# Patient Record
Sex: Female | Born: 1935 | Race: Black or African American | Hispanic: No | Marital: Married | Smoking: Never smoker
Health system: Southern US, Community
[De-identification: ages and names within clinical notes are randomized; demographics above are authoritative.]

## PROBLEM LIST (undated history)

## (undated) DIAGNOSIS — I5020 Unspecified systolic (congestive) heart failure: Secondary | ICD-10-CM

## (undated) DIAGNOSIS — E119 Type 2 diabetes mellitus without complications: Secondary | ICD-10-CM

## (undated) DIAGNOSIS — M1990 Unspecified osteoarthritis, unspecified site: Secondary | ICD-10-CM

## (undated) DIAGNOSIS — E785 Hyperlipidemia, unspecified: Secondary | ICD-10-CM

## (undated) DIAGNOSIS — S065XAA Traumatic subdural hemorrhage with loss of consciousness status unknown, initial encounter: Secondary | ICD-10-CM

## (undated) DIAGNOSIS — G459 Transient cerebral ischemic attack, unspecified: Secondary | ICD-10-CM

## (undated) DIAGNOSIS — G4730 Sleep apnea, unspecified: Secondary | ICD-10-CM

## (undated) DIAGNOSIS — I10 Essential (primary) hypertension: Secondary | ICD-10-CM

---

## 1958-11-01 HISTORY — PX: HEMORRHOID SURGERY: SHX153

## 1976-03-02 HISTORY — PX: ABDOMINAL HYSTERECTOMY: SHX81

## 1997-07-11 ENCOUNTER — Emergency Department (HOSPITAL_COMMUNITY): Admission: EM | Admit: 1997-07-11 | Discharge: 1997-07-11 | Payer: Self-pay | Admitting: Emergency Medicine

## 2002-11-22 ENCOUNTER — Encounter: Payer: Self-pay | Admitting: Cardiology

## 2002-11-22 ENCOUNTER — Ambulatory Visit (HOSPITAL_COMMUNITY): Admission: RE | Admit: 2002-11-22 | Discharge: 2002-11-22 | Payer: Self-pay | Admitting: Cardiology

## 2003-07-26 ENCOUNTER — Encounter: Admission: RE | Admit: 2003-07-26 | Discharge: 2003-07-26 | Payer: Self-pay | Admitting: Family Medicine

## 2003-09-27 ENCOUNTER — Ambulatory Visit (HOSPITAL_COMMUNITY): Admission: RE | Admit: 2003-09-27 | Discharge: 2003-09-27 | Payer: Self-pay | Admitting: Gastroenterology

## 2005-01-15 ENCOUNTER — Encounter: Admission: RE | Admit: 2005-01-15 | Discharge: 2005-01-15 | Payer: Self-pay | Admitting: Cardiology

## 2005-06-24 ENCOUNTER — Emergency Department (HOSPITAL_COMMUNITY): Admission: EM | Admit: 2005-06-24 | Discharge: 2005-06-24 | Payer: Self-pay | Admitting: Emergency Medicine

## 2005-10-16 ENCOUNTER — Encounter (HOSPITAL_COMMUNITY): Admission: RE | Admit: 2005-10-16 | Discharge: 2005-11-11 | Payer: Self-pay | Admitting: Cardiology

## 2010-03-23 ENCOUNTER — Encounter: Payer: Self-pay | Admitting: Cardiology

## 2012-08-10 NOTE — Progress Notes (Signed)
Need orders please - pt coming for preop Fri 08-12-12 - thank you

## 2012-08-11 ENCOUNTER — Encounter (HOSPITAL_COMMUNITY): Payer: Self-pay | Admitting: Pharmacy Technician

## 2012-08-12 ENCOUNTER — Ambulatory Visit (HOSPITAL_COMMUNITY)
Admission: RE | Admit: 2012-08-12 | Discharge: 2012-08-12 | Disposition: A | Discharge status: Home / Self Care | Payer: Medicare Other | Source: Ambulatory Visit | Attending: Orthopedic Surgery | Admitting: Orthopedic Surgery

## 2012-08-12 ENCOUNTER — Encounter (HOSPITAL_COMMUNITY)
Admission: RE | Admit: 2012-08-12 | Discharge: 2012-08-12 | Disposition: A | Discharge status: Home / Self Care | Payer: Medicare Other | Source: Ambulatory Visit | Attending: Orthopedic Surgery | Admitting: Orthopedic Surgery

## 2012-08-12 ENCOUNTER — Encounter (HOSPITAL_COMMUNITY): Payer: Self-pay

## 2012-08-12 DIAGNOSIS — M47814 Spondylosis without myelopathy or radiculopathy, thoracic region: Secondary | ICD-10-CM | POA: Insufficient documentation

## 2012-08-12 DIAGNOSIS — Z01818 Encounter for other preprocedural examination: Secondary | ICD-10-CM | POA: Insufficient documentation

## 2012-08-12 HISTORY — DX: Essential (primary) hypertension: I10

## 2012-08-12 HISTORY — DX: Hyperlipidemia, unspecified: E78.5

## 2012-08-12 HISTORY — DX: Sleep apnea, unspecified: G47.30

## 2012-08-12 HISTORY — DX: Hypercalcemia: E83.52

## 2012-08-12 HISTORY — DX: Unspecified osteoarthritis, unspecified site: M19.90

## 2012-08-12 LAB — CBC
HCT: 44.5 % (ref 36.0–46.0)
Hemoglobin: 14.3 g/dL (ref 12.0–15.0)
MCHC: 32.1 g/dL (ref 30.0–36.0)

## 2012-08-12 LAB — URINALYSIS, ROUTINE W REFLEX MICROSCOPIC
Bilirubin Urine: NEGATIVE
Leukocytes, UA: NEGATIVE
Nitrite: NEGATIVE
Specific Gravity, Urine: 1.014 (ref 1.005–1.030)
pH: 7.5 (ref 5.0–8.0)

## 2012-08-12 LAB — BASIC METABOLIC PANEL
BUN: 14 mg/dL (ref 6–23)
Chloride: 100 mEq/L (ref 96–112)
GFR calc Af Amer: 67 mL/min — ABNORMAL LOW (ref >90)
Glucose, Bld: 163 mg/dL — ABNORMAL HIGH (ref 70–99)
Potassium: 4 mEq/L (ref 3.5–5.1)

## 2012-08-12 LAB — SURGICAL PCR SCREEN
MRSA, PCR: NEGATIVE
Staphylococcus aureus: NEGATIVE

## 2012-08-12 LAB — PROTIME-INR: INR: 1 (ref 0.00–1.49)

## 2012-08-12 NOTE — Pre-Procedure Instructions (Signed)
PT HAS EKG REPORT ON HER CHART 07/27/12 AND OFFICE NOTE 07/27/12 AND NOTE OF MEDICAL CLEARANCE FROM DR. G. STONE. CXR WAS DONE TODAY PREOP AT [facility].

## 2012-08-12 NOTE — Patient Instructions (Signed)
YOUR SURGERY IS SCHEDULED AT [facility]  ON:   Tuesday  June 24  REPORT TO [facility] AT:  7:25 AM      PHONE # FOR SHORT STAY IS [phone]  DO NOT EAT OR DRINK ANYTHING AFTER MIDNIGHT THE NIGHT BEFORE YOUR SURGERY.  YOU MAY BRUSH YOUR TEETH, RINSE OUT YOUR MOUTH--BUT NO WATER, NO FOOD, NO CHEWING GUM, NO MINTS, NO CANDIES, NO CHEWING TOBACCO.  PLEASE TAKE THE FOLLOWING MEDICATIONS THE AM OF YOUR SURGERY WITH A FEW SIPS OF WATER:  TAKE YOUR TRAMADOL FOR PAIN IF YOU NEED IT.   IF YOU HAVE SLEEP APNEA AND USE CPAP OR BIPAP--PLEASE BRING THE MASK AND THE TUBING.  DO NOT BRING YOUR MACHINE.  DO NOT BRING VALUABLES, MONEY, CREDIT CARDS.  DO NOT WEAR JEWELRY, MAKE-UP, NAIL POLISH AND NO METAL PINS OR CLIPS IN YOUR HAIR. CONTACT LENS, DENTURES / PARTIALS, GLASSES SHOULD NOT BE WORN TO SURGERY AND IN MOST CASES-HEARING AIDS WILL NEED TO BE REMOVED.  BRING YOUR GLASSES CASE, ANY EQUIPMENT NEEDED FOR YOUR CONTACT LENS. FOR PATIENTS ADMITTED TO THE HOSPITAL--CHECK OUT TIME THE DAY OF DISCHARGE IS 11:00 AM.  ALL INPATIENT ROOMS ARE PRIVATE - WITH BATHROOM, TELEPHONE, TELEVISION AND WIFI INTERNET.                              PLEASE READ OVER ANY  FACT SHEETS THAT YOU WERE GIVEN: MRSA INFORMATION, BLOOD TRANSFUSION INFORMATION, INCENTIVE SPIROMETER INFORMATION. FAILURE TO FOLLOW THESE INSTRUCTIONS MAY RESULT IN THE CANCELLATION OF YOUR SURGERY.   PATIENT SIGNATURE_________________________________

## 2012-08-18 NOTE — H&P (Signed)
TOTAL HIP ADMISSION H&P  Patient is admitted for right total hip arthroplasty, anterior approach.  Subjective:  Chief Complaint:    Right hip OA / pain  HPI: Amber Bolton, 79 y.o. female, has a history of pain and functional disability in the right hip(s) due to arthritis and patient has failed non-surgical conservative treatments for greater than 12 weeks to include NSAID's and/or analgesics, use of assistive devices and activity modification.  Onset of symptoms was gradual starting 2 years ago with rapidlly worsening course since that time.The patient noted no past surgery on the right hip(s).  Patient currently rates pain in the right hip at 8 out of 10 with activity. Patient has night pain, worsening of pain with activity and weight bearing, trendelenberg gait, pain that interfers with activities of daily living and pain with passive range of motion. Patient has evidence of periarticular osteophytes and joint space narrowing by imaging studies. This condition presents safety issues increasing the risk of falls.  There is no current active signs of infection.  Risks, benefits and expectations were discussed with the patient. Patient understand the risks, benefits and expectations and wishes to proceed with surgery.   D/C Plans:   Home with HHPT  Post-op Meds:    Rx given for ASA, Zanaflex, Iron, Colace and MiraLax  Tranexamic Acid:   To be given  Decadron:    To be given  FYI:    ASA post-op    Past Medical History  Diagnosis Date  . Hypertension   . Hypercalcemia   . Hyperlipemia   . Sleep apnea     STATES SHE HAS CPAP - BUT HAS NOT USED IN THE LAST 2 YRS  . Arthritis     IN RIGHT HIP AND FINGERS AND BACK    Past Surgical History  Procedure Laterality Date  . Abdominal hysterectomy  1978  . Hemorrhoid surgery  1960'S     Allergies  Allergen Reactions  . Shellfish Allergy     MAKES MOUTH ITCH  . Penicillins Rash    History  Substance Use Topics  . Smoking status:  Never Smoker   . Smokeless tobacco: Never Used  . Alcohol Use: No    No family history on file.   Review of Systems  Constitutional: Negative.   HENT: Positive for hearing loss.   Eyes: Negative.   Respiratory: Negative.   Cardiovascular: Negative.   Gastrointestinal: Negative.   Genitourinary: Negative.   Musculoskeletal: Positive for joint pain.  Skin: Negative.   Neurological: Negative.   Endo/Heme/Allergies: Negative.   Psychiatric/Behavioral: Negative.     Objective:  Physical Exam  Constitutional: She is oriented to person, place, and time. She appears well-developed and well-nourished.  HENT:  Head: Normocephalic and atraumatic.  Mouth/Throat: Oropharynx is clear and moist.  Eyes: Pupils are equal, round, and reactive to light.  Neck: Neck supple. No JVD present. No tracheal deviation present. No thyromegaly present.  Cardiovascular: Normal rate, regular rhythm, normal heart sounds and intact distal pulses.   Respiratory: Effort normal and breath sounds normal. No stridor. No respiratory distress. She has no wheezes.  GI: Soft. There is no tenderness. There is no guarding.  Musculoskeletal:       Right hip: She exhibits decreased range of motion, decreased strength, tenderness and bony tenderness. She exhibits no swelling, no deformity and no laceration.  Lymphadenopathy:    She has no cervical adenopathy.  Neurological: She is alert and oriented to person, place, and time.  Skin:  Skin is warm and dry.  Psychiatric: She has a normal mood and affect.    Imaging Review Plain radiographs demonstrate severe degenerative joint disease of the right hip(s). The bone quality appears to be good for age and reported activity level.  Assessment/Plan:  End stage arthritis, right hip(s)  The patient history, physical examination, clinical judgement of the provider and imaging studies are consistent with end stage degenerative joint disease of the right hip(s) and total hip  arthroplasty is deemed medically necessary. The treatment options including medical management, injection therapy, arthroscopy and arthroplasty were discussed at length. The risks and benefits of total hip arthroplasty were presented and reviewed. The risks due to aseptic loosening, infection, stiffness, dislocation/subluxation,  thromboembolic complications and other imponderables were discussed.  The patient acknowledged the explanation, agreed to proceed with the plan and consent was signed. Patient is being admitted for inpatient treatment for surgery, pain control, PT, OT, prophylactic antibiotics, VTE prophylaxis, progressive ambulation and ADL's and discharge planning.The patient is planning to be discharged home with home health services.    Anastasio Auerbach Mykah Shin   PAC  08/18/2012, 2:53 PM

## 2012-08-23 ENCOUNTER — Inpatient Hospital Stay (HOSPITAL_COMMUNITY): Payer: Medicare Other

## 2012-08-23 ENCOUNTER — Encounter (HOSPITAL_COMMUNITY): Payer: Self-pay | Admitting: Anesthesiology

## 2012-08-23 ENCOUNTER — Encounter (HOSPITAL_COMMUNITY)
Admission: RE | Disposition: A | Discharge status: Home Health | Payer: Self-pay | Source: Ambulatory Visit | Attending: Orthopedic Surgery

## 2012-08-23 ENCOUNTER — Inpatient Hospital Stay (HOSPITAL_COMMUNITY)
Admission: RE | Admit: 2012-08-23 | Discharge: 2012-08-24 | DRG: 470 | Disposition: A | Discharge status: Home Health | Payer: Medicare Other | Source: Ambulatory Visit | Attending: Orthopedic Surgery | Admitting: Orthopedic Surgery

## 2012-08-23 ENCOUNTER — Inpatient Hospital Stay (HOSPITAL_COMMUNITY): Payer: Medicare Other | Admitting: Anesthesiology

## 2012-08-23 ENCOUNTER — Encounter (HOSPITAL_COMMUNITY): Payer: Self-pay | Admitting: *Deleted

## 2012-08-23 DIAGNOSIS — E669 Obesity, unspecified: Secondary | ICD-10-CM | POA: Diagnosis present

## 2012-08-23 DIAGNOSIS — M169 Osteoarthritis of hip, unspecified: Principal | ICD-10-CM | POA: Diagnosis present

## 2012-08-23 DIAGNOSIS — I10 Essential (primary) hypertension: Secondary | ICD-10-CM | POA: Diagnosis present

## 2012-08-23 DIAGNOSIS — Z6830 Body mass index (BMI) 30.0-30.9, adult: Secondary | ICD-10-CM

## 2012-08-23 DIAGNOSIS — D500 Iron deficiency anemia secondary to blood loss (chronic): Secondary | ICD-10-CM | POA: Diagnosis not present

## 2012-08-23 DIAGNOSIS — E785 Hyperlipidemia, unspecified: Secondary | ICD-10-CM | POA: Diagnosis present

## 2012-08-23 DIAGNOSIS — Z96649 Presence of unspecified artificial hip joint: Secondary | ICD-10-CM

## 2012-08-23 DIAGNOSIS — M1610 Unilateral primary osteoarthritis, unspecified hip: Principal | ICD-10-CM | POA: Diagnosis present

## 2012-08-23 DIAGNOSIS — D62 Acute posthemorrhagic anemia: Secondary | ICD-10-CM | POA: Diagnosis not present

## 2012-08-23 HISTORY — PX: TOTAL HIP ARTHROPLASTY: SHX124

## 2012-08-23 LAB — TYPE AND SCREEN
ABO/RH(D): A POS
Antibody Screen: NEGATIVE

## 2012-08-23 SURGERY — ARTHROPLASTY, HIP, TOTAL, ANTERIOR APPROACH
Anesthesia: Spinal | Site: Hip | Laterality: Right | Wound class: Clean

## 2012-08-23 MED ORDER — CELECOXIB 200 MG PO CAPS
200.0000 mg | ORAL_CAPSULE | Freq: Two times a day (BID) | ORAL | Status: DC
  Administered 2012-08-23 – 2012-08-24 (×2): 200 mg via ORAL
  Filled 2012-08-23 (×3): qty 1

## 2012-08-23 MED ORDER — PROPOFOL INFUSION 10 MG/ML OPTIME
INTRAVENOUS | Status: DC | PRN
  Administered 2012-08-23: 140 ug/kg/min via INTRAVENOUS

## 2012-08-23 MED ORDER — ALPRAZOLAM 0.5 MG PO TABS: 0.5000 mg | ORAL_TABLET | Freq: Every evening | ORAL | Status: DC | PRN

## 2012-08-23 MED ORDER — MENTHOL 3 MG MT LOZG
1.0000 | LOZENGE | OROMUCOSAL | Status: DC | PRN
  Filled 2012-08-23: qty 9

## 2012-08-23 MED ORDER — FLEET ENEMA 7-19 GM/118ML RE ENEM: 1.0000 | ENEMA | Freq: Once | RECTAL | Status: AC | PRN

## 2012-08-23 MED ORDER — METHOCARBAMOL 500 MG PO TABS
500.0000 mg | ORAL_TABLET | Freq: Four times a day (QID) | ORAL | Status: DC | PRN
  Administered 2012-08-23 – 2012-08-24 (×3): 500 mg via ORAL
  Filled 2012-08-23 (×3): qty 1

## 2012-08-23 MED ORDER — METOCLOPRAMIDE HCL 10 MG PO TABS: 5.0000 mg | ORAL_TABLET | Freq: Three times a day (TID) | ORAL | Status: DC | PRN

## 2012-08-23 MED ORDER — ZOLPIDEM TARTRATE 5 MG PO TABS: 5.0000 mg | ORAL_TABLET | Freq: Every evening | ORAL | Status: DC | PRN

## 2012-08-23 MED ORDER — HYDROMORPHONE HCL PF 1 MG/ML IJ SOLN: 0.2500 mg | INTRAMUSCULAR | Status: DC | PRN

## 2012-08-23 MED ORDER — ALUM & MAG HYDROXIDE-SIMETH 200-200-20 MG/5ML PO SUSP: 30.0000 mL | ORAL | Status: DC | PRN

## 2012-08-23 MED ORDER — DOCUSATE SODIUM 100 MG PO CAPS
100.0000 mg | ORAL_CAPSULE | Freq: Two times a day (BID) | ORAL | Status: DC
  Administered 2012-08-23 – 2012-08-24 (×2): 100 mg via ORAL

## 2012-08-23 MED ORDER — PROMETHAZINE HCL 25 MG/ML IJ SOLN: 6.2500 mg | INTRAMUSCULAR | Status: DC | PRN

## 2012-08-23 MED ORDER — FENTANYL CITRATE 0.05 MG/ML IJ SOLN
INTRAMUSCULAR | Status: DC | PRN
  Administered 2012-08-23: 50 ug via INTRAVENOUS
  Administered 2012-08-23 (×2): 25 ug via INTRAVENOUS

## 2012-08-23 MED ORDER — TRANEXAMIC ACID 100 MG/ML IV SOLN
1000.0000 mg | Freq: Once | INTRAVENOUS | Status: AC
  Administered 2012-08-23: 1000 mg via INTRAVENOUS
  Filled 2012-08-23: qty 10

## 2012-08-23 MED ORDER — ONDANSETRON HCL 4 MG PO TABS
4.0000 mg | ORAL_TABLET | Freq: Four times a day (QID) | ORAL | Status: DC | PRN
  Administered 2012-08-23: 4 mg via ORAL
  Filled 2012-08-23: qty 1

## 2012-08-23 MED ORDER — LACTATED RINGERS IV SOLN
INTRAVENOUS | Status: DC | PRN
  Administered 2012-08-23 (×2): via INTRAVENOUS

## 2012-08-23 MED ORDER — ACETAMINOPHEN 10 MG/ML IV SOLN: 1000.0000 mg | Freq: Once | INTRAVENOUS | Status: DC | PRN

## 2012-08-23 MED ORDER — DIPHENHYDRAMINE HCL 25 MG PO CAPS: 25.0000 mg | ORAL_CAPSULE | Freq: Four times a day (QID) | ORAL | Status: DC | PRN

## 2012-08-23 MED ORDER — FERROUS SULFATE 325 (65 FE) MG PO TABS
325.0000 mg | ORAL_TABLET | Freq: Three times a day (TID) | ORAL | Status: DC
  Administered 2012-08-24 (×2): 325 mg via ORAL
  Filled 2012-08-23 (×4): qty 1

## 2012-08-23 MED ORDER — AMLODIPINE BESYLATE 5 MG PO TABS
5.0000 mg | ORAL_TABLET | Freq: Every morning | ORAL | Status: DC
  Administered 2012-08-24: 5 mg via ORAL
  Filled 2012-08-23: qty 1

## 2012-08-23 MED ORDER — DEXAMETHASONE SODIUM PHOSPHATE 10 MG/ML IJ SOLN
10.0000 mg | Freq: Once | INTRAMUSCULAR | Status: AC
  Administered 2012-08-23: 10 mg via INTRAVENOUS

## 2012-08-23 MED ORDER — METHOCARBAMOL 100 MG/ML IJ SOLN
500.0000 mg | Freq: Four times a day (QID) | INTRAVENOUS | Status: DC | PRN
  Filled 2012-08-23: qty 5

## 2012-08-23 MED ORDER — CHLORHEXIDINE GLUCONATE 4 % EX LIQD: 60.0000 mL | Freq: Once | CUTANEOUS | Status: DC

## 2012-08-23 MED ORDER — PHENYLEPHRINE HCL 10 MG/ML IJ SOLN
INTRAMUSCULAR | Status: DC | PRN
  Administered 2012-08-23: 40 ug via INTRAVENOUS
  Administered 2012-08-23 (×3): 80 ug via INTRAVENOUS

## 2012-08-23 MED ORDER — CELECOXIB 200 MG PO CAPS
2200.0000 mg | ORAL_CAPSULE | Freq: Two times a day (BID) | ORAL | Status: DC
  Filled 2012-08-23 (×2): qty 11

## 2012-08-23 MED ORDER — STERILE WATER FOR IRRIGATION IR SOLN
Status: DC | PRN
  Administered 2012-08-23: 1500 mL

## 2012-08-23 MED ORDER — ACETAMINOPHEN 10 MG/ML IV SOLN
INTRAVENOUS | Status: DC | PRN
  Administered 2012-08-23: 1000 mg via INTRAVENOUS

## 2012-08-23 MED ORDER — PHENOL 1.4 % MT LIQD
1.0000 | OROMUCOSAL | Status: DC | PRN
  Filled 2012-08-23: qty 177

## 2012-08-23 MED ORDER — DEXAMETHASONE SODIUM PHOSPHATE 10 MG/ML IJ SOLN
10.0000 mg | Freq: Once | INTRAMUSCULAR | Status: AC
  Administered 2012-08-24: 10 mg via INTRAVENOUS
  Filled 2012-08-23: qty 1

## 2012-08-23 MED ORDER — ASPIRIN EC 325 MG PO TBEC
325.0000 mg | DELAYED_RELEASE_TABLET | Freq: Two times a day (BID) | ORAL | Status: DC
  Administered 2012-08-24: 325 mg via ORAL
  Filled 2012-08-23 (×3): qty 1

## 2012-08-23 MED ORDER — PROPOFOL 10 MG/ML IV BOLUS
INTRAVENOUS | Status: DC | PRN
  Administered 2012-08-23: 30 mg via INTRAVENOUS

## 2012-08-23 MED ORDER — OXYCODONE HCL 5 MG/5ML PO SOLN
5.0000 mg | Freq: Once | ORAL | Status: DC | PRN
  Filled 2012-08-23: qty 5

## 2012-08-23 MED ORDER — HYDROCODONE-ACETAMINOPHEN 7.5-325 MG PO TABS
1.0000 | ORAL_TABLET | ORAL | Status: DC
  Administered 2012-08-23 – 2012-08-24 (×5): 1 via ORAL
  Administered 2012-08-24: 2 via ORAL
  Filled 2012-08-23: qty 2
  Filled 2012-08-23 (×3): qty 1
  Filled 2012-08-23 (×3): qty 2

## 2012-08-23 MED ORDER — 0.9 % SODIUM CHLORIDE (POUR BTL) OPTIME
TOPICAL | Status: DC | PRN
  Administered 2012-08-23: 1000 mL

## 2012-08-23 MED ORDER — BISACODYL 10 MG RE SUPP: 10.0000 mg | Freq: Every day | RECTAL | Status: DC | PRN

## 2012-08-23 MED ORDER — ONDANSETRON HCL 4 MG/2ML IJ SOLN: 4.0000 mg | Freq: Four times a day (QID) | INTRAMUSCULAR | Status: DC | PRN

## 2012-08-23 MED ORDER — CEFAZOLIN SODIUM-DEXTROSE 2-3 GM-% IV SOLR
2.0000 g | INTRAVENOUS | Status: AC
  Administered 2012-08-23: 2 g via INTRAVENOUS

## 2012-08-23 MED ORDER — BUPIVACAINE IN DEXTROSE 0.75-8.25 % IT SOLN
INTRATHECAL | Status: DC | PRN
  Administered 2012-08-23: 2 mL via INTRATHECAL

## 2012-08-23 MED ORDER — AMLODIPINE BESYLATE 5 MG PO TABS
5.0000 mg | ORAL_TABLET | Freq: Every day | ORAL | Status: DC
  Filled 2012-08-23: qty 1

## 2012-08-23 MED ORDER — SODIUM CHLORIDE 0.9 % IV SOLN
100.0000 mL/h | INTRAVENOUS | Status: DC
  Administered 2012-08-23 – 2012-08-24 (×2): 100 mL/h via INTRAVENOUS
  Filled 2012-08-23 (×8): qty 1000

## 2012-08-23 MED ORDER — OXYCODONE HCL 5 MG PO TABS: 5.0000 mg | ORAL_TABLET | Freq: Once | ORAL | Status: DC | PRN

## 2012-08-23 MED ORDER — METOCLOPRAMIDE HCL 5 MG/ML IJ SOLN: 5.0000 mg | Freq: Three times a day (TID) | INTRAMUSCULAR | Status: DC | PRN

## 2012-08-23 MED ORDER — AMLODIPINE BESYLATE-VALSARTAN 5-320 MG PO TABS: 1.0000 | ORAL_TABLET | Freq: Every morning | ORAL | Status: DC

## 2012-08-23 MED ORDER — CEFAZOLIN SODIUM-DEXTROSE 2-3 GM-% IV SOLR
2.0000 g | Freq: Four times a day (QID) | INTRAVENOUS | Status: AC
  Administered 2012-08-23 (×2): 2 g via INTRAVENOUS
  Filled 2012-08-23 (×2): qty 50

## 2012-08-23 MED ORDER — IRBESARTAN 300 MG PO TABS
300.0000 mg | ORAL_TABLET | Freq: Every morning | ORAL | Status: DC
  Administered 2012-08-24: 300 mg via ORAL
  Filled 2012-08-23: qty 1

## 2012-08-23 MED ORDER — MEPERIDINE HCL 50 MG/ML IJ SOLN: 6.2500 mg | INTRAMUSCULAR | Status: DC | PRN

## 2012-08-23 MED ORDER — HYDROMORPHONE HCL PF 1 MG/ML IJ SOLN
0.5000 mg | INTRAMUSCULAR | Status: DC | PRN
  Administered 2012-08-23: 0.5 mg via INTRAVENOUS
  Filled 2012-08-23: qty 1

## 2012-08-23 MED ORDER — POLYETHYLENE GLYCOL 3350 17 G PO PACK
17.0000 g | PACK | Freq: Two times a day (BID) | ORAL | Status: DC
  Administered 2012-08-23 – 2012-08-24 (×2): 17 g via ORAL

## 2012-08-23 SURGICAL SUPPLY — 40 items
ADH SKN CLS APL DERMABOND .7 (GAUZE/BANDAGES/DRESSINGS) ×1
BAG SPEC THK2 15X12 ZIP CLS (MISCELLANEOUS) ×2
BAG ZIPLOCK 12X15 (MISCELLANEOUS) ×4 IMPLANT
BLADE SAW SGTL 18X1.27X75 (BLADE) ×2 IMPLANT
CAPT HIP PF MOP ×1 IMPLANT
CLOTH BEACON ORANGE TIMEOUT ST (SAFETY) ×2 IMPLANT
DERMABOND ADVANCED (GAUZE/BANDAGES/DRESSINGS) ×1
DERMABOND ADVANCED .7 DNX12 (GAUZE/BANDAGES/DRESSINGS) ×1 IMPLANT
DRAPE C-ARM 42X120 X-RAY (DRAPES) ×2 IMPLANT
DRAPE STERI IOBAN 125X83 (DRAPES) ×2 IMPLANT
DRAPE U-SHAPE 47X51 STRL (DRAPES) ×6 IMPLANT
DRSG AQUACEL AG ADV 3.5X10 (GAUZE/BANDAGES/DRESSINGS) ×2 IMPLANT
DRSG TEGADERM 4X4.75 (GAUZE/BANDAGES/DRESSINGS) ×1 IMPLANT
DURAPREP 26ML APPLICATOR (WOUND CARE) ×2 IMPLANT
ELECT BLADE TIP CTD 4 INCH (ELECTRODE) ×2 IMPLANT
ELECT REM PT RETURN 9FT ADLT (ELECTROSURGICAL) ×2
ELECTRODE REM PT RTRN 9FT ADLT (ELECTROSURGICAL) ×1 IMPLANT
EVACUATOR 1/8 PVC DRAIN (DRAIN) IMPLANT
FACESHIELD LNG OPTICON STERILE (SAFETY) ×8 IMPLANT
GAUZE SPONGE 2X2 8PLY STRL LF (GAUZE/BANDAGES/DRESSINGS) ×1 IMPLANT
GLOVE BIOGEL PI IND STRL 7.5 (GLOVE) ×1 IMPLANT
GLOVE BIOGEL PI IND STRL 8 (GLOVE) ×1 IMPLANT
GLOVE BIOGEL PI INDICATOR 7.5 (GLOVE) ×1
GLOVE BIOGEL PI INDICATOR 8 (GLOVE) ×1
GLOVE ECLIPSE 8.0 STRL XLNG CF (GLOVE) ×2 IMPLANT
GLOVE ORTHO TXT STRL SZ7.5 (GLOVE) ×4 IMPLANT
GOWN BRE IMP PREV XXLGXLNG (GOWN DISPOSABLE) ×2 IMPLANT
GOWN STRL NON-REIN LRG LVL3 (GOWN DISPOSABLE) ×2 IMPLANT
KIT BASIN OR (CUSTOM PROCEDURE TRAY) ×2 IMPLANT
PACK TOTAL JOINT (CUSTOM PROCEDURE TRAY) ×2 IMPLANT
PADDING CAST COTTON 6X4 STRL (CAST SUPPLIES) ×2 IMPLANT
SPONGE GAUZE 2X2 STER 10/PKG (GAUZE/BANDAGES/DRESSINGS) ×1
SUCTION FRAZIER 12FR DISP (SUCTIONS) ×2 IMPLANT
SUT MNCRL AB 4-0 PS2 18 (SUTURE) ×2 IMPLANT
SUT VIC AB 1 CT1 36 (SUTURE) ×8 IMPLANT
SUT VIC AB 2-0 CT1 27 (SUTURE) ×4
SUT VIC AB 2-0 CT1 TAPERPNT 27 (SUTURE) ×2 IMPLANT
SUT VLOC 180 0 24IN GS25 (SUTURE) ×2 IMPLANT
TOWEL OR 17X26 10 PK STRL BLUE (TOWEL DISPOSABLE) ×4 IMPLANT
TRAY FOLEY CATH 14FRSI W/METER (CATHETERS) ×2 IMPLANT

## 2012-08-23 NOTE — Anesthesia Preprocedure Evaluation (Addendum)
Anesthesia Evaluation  Patient identified by MRN, date of birth, ID band Patient awake    Reviewed: Allergy & Precautions, H&P , NPO status , Patient's Chart, lab work & pertinent test results  Airway Mallampati: II TM Distance: >3 FB Neck ROM: Full    Dental  (+) Dental Advisory Given, Loose, Poor Dentition and Missing   Pulmonary sleep apnea ,  breath sounds clear to auscultation        Cardiovascular hypertension, Pt. on medications Rhythm:Regular Rate:Normal     Neuro/Psych negative neurological ROS  negative psych ROS   GI/Hepatic negative GI ROS, Neg liver ROS,   Endo/Other  negative endocrine ROS  Renal/GU negative Renal ROS     Musculoskeletal negative musculoskeletal ROS (+)   Abdominal (+) + obese,   Peds  Hematology negative hematology ROS (+)   Anesthesia Other Findings   Reproductive/Obstetrics negative OB ROS                          Anesthesia Physical Anesthesia Plan  ASA: III  Anesthesia Plan: Spinal   Post-op Pain Management:    Induction: Intravenous  Airway Management Planned:   Additional Equipment:   Intra-op Plan:   Post-operative Plan:   Informed Consent: I have reviewed the patients History and Physical, chart, labs and discussed the procedure including the risks, benefits and alternatives for the proposed anesthesia with the patient or authorized representative who has indicated his/her understanding and acceptance.   Dental advisory given  Plan Discussed with: CRNA  Anesthesia Plan Comments:        Anesthesia Quick Evaluation

## 2012-08-23 NOTE — Interval H&P Note (Signed)
History and Physical Interval Note:  08/23/2012 8:51 AM  Amber Bolton  has presented today for surgery, with the diagnosis of RIGHT HIP OA  The various methods of treatment have been discussed with the patient and family. After consideration of risks, benefits and other options for treatment, the patient has consented to  Procedure(s): RIGHT TOTAL HIP ARTHROPLASTY ANTERIOR APPROACH (Right) as a surgical intervention .  The patient's history has been reviewed, patient examined, no change in status, stable for surgery.  I have reviewed the patient's chart and labs.  Questions were answered to the patient's satisfaction.     [name]

## 2012-08-23 NOTE — Op Note (Signed)
NAME:  DONDA FRIEDLI.: [id number]      MEDICAL RECORD NO.: [id number]      FACILITY:  [facility]      PHYSICIAN:  Durene Romans D  DATE OF BIRTH:  [birth year]     DATE OF PROCEDURE:  08/23/2012                                 OPERATIVE REPORT         PREOPERATIVE DIAGNOSIS: Right  hip osteoarthritis.      POSTOPERATIVE DIAGNOSIS:  Right hip osteoarthritis.      PROCEDURE:  Right total hip replacement through an anterior approach   utilizing DePuy THR system, component size 50mm pinnacle cup, a size 32+4 neutral   Altrex liner, a size 4 standard Tri Lock stem with a 32+5 metal ball.      SURGEON:  Madlyn Frankel. Charlann Boxer, M.D.      ASSISTANT:  Lanney Gins, PA-C      ANESTHESIA:  General.      SPECIMENS:  None.      COMPLICATIONS:  None.      BLOOD LOSS:  250 cc     DRAINS:  One Hemovac.      INDICATION OF THE PROCEDURE:  ARVILLA SALADA is a 79 y.o. female who had   presented to office for evaluation of right hip pain.  Radiographs revealed   progressive degenerative changes with bone-on-bone   articulation to the  hip joint.  The patient had painful limited range of   motion significantly affecting their overall quality of life.  The patient was failing to    respond to conservative measures, and at this point was ready   to proceed with more definitive measures.  The patient has noted progressive   degenerative changes in his hip, progressive problems and dysfunction   with regarding the hip prior to surgery.  Consent was obtained for   benefit of pain relief.  Specific risk of infection, DVT, component   failure, dislocation, need for revision surgery, as well discussion of   the anterior versus posterior approach were reviewed.  Consent was   obtained for benefit of anterior pain relief through an anterior   approach.      PROCEDURE IN DETAIL:  The patient was brought to operative theater.   Once adequate anesthesia,  preoperative antibiotics, 2gm Ancef administered.   The patient was positioned supine on the OSI Hanna table.  Once adequate   padding of boney process was carried out, we had predraped out the hip, and  used fluoroscopy to confirm orientation of the pelvis and position.      The right hip was then prepped and draped from proximal iliac crest to   mid thigh with shower curtain technique.      Time-out was performed identifying the patient, planned procedure, and   extremity.     An incision was then made 2 cm distal and lateral to the   anterior superior iliac spine extending over the orientation of the   tensor fascia lata muscle and sharp dissection was carried down to the   fascia of the muscle and protractor placed in the soft tissues.      The fascia was then incised.  The muscle belly was identified and swept  laterally and retractor placed along the superior neck.  Following   cauterization of the circumflex vessels and removing some pericapsular   fat, a second cobra retractor was placed on the inferior neck.  A third   retractor was placed on the anterior acetabulum after elevating the   anterior rectus.  A L-capsulotomy was along the line of the   superior neck to the trochanteric fossa, then extended proximally and   distally.  Tag sutures were placed and the retractors were then placed   intracapsular.  We then identified the trochanteric fossa and   orientation of my neck cut, confirmed this radiographically   and then made a neck osteotomy with the femur on traction.  The femoral   head was removed without difficulty or complication.  Traction was let   off and retractors were placed posterior and anterior around the   acetabulum.      The labrum and foveal tissue were debrided.  I began reaming with a 45mm   reamer and reamed up to 49mm reamer with good bony bed preparation and a 50   cup was chosen.  The final 50mm Pinnacle cup was then impacted under fluoroscopy  to  confirm the depth of penetration and orientation with respect to   abduction.  A screw was placed followed by the hole eliminator.  The final   32+4 neutral Altrex liner was impacted with good visualized rim fit.  The cup was positioned anatomically within the acetabular portion of the pelvis.      At this point, the femur was rolled at 80 degrees.  Further capsule was   released off the inferior aspect of the femoral neck.  I then   released the superior capsule proximally.  The hook was placed laterally   along the femur and elevated manually and held in position with the bed   hook.  The leg was then extended and adducted with the leg rolled to 100   degrees of external rotation.  Once the proximal femur was fully   exposed, I used a box osteotome to set orientation.  I then began   broaching with the starting chili pepper broach and passed this by hand and then broached up to 4.  With the 4 broach in place I chose a standard offset neck and did a trial reduction.  The offset was appropriate, leg lengths   appeared to be equal, confirmed radiographically.   Given these findings, I went ahead and dislocated the hip, repositioned all   retractors and positioned the right hip in the extended and abducted position.  The final 4 standard Tri Lock stem was   chosen and it was impacted down to the level of neck cut.  Based on this   and the trial reduction, a 32+5 metal ball was chosen and   impacted onto a clean and dry trunnion, and the hip was reduced.  The   hip had been irrigated throughout the case again at this point.  I did   reapproximate the superior capsular leaflet to the anterior leaflet   using #1 Vicryl, placed a medium Hemovac drain deep.  The fascia of the   tensor fascia lata muscle was then reapproximated using #1 Vicryl.  The   remaining wound was closed with 2-0 Vicryl and running 4-0 Monocryl.   The hip was cleaned, dried, and dressed sterilely using Dermabond and   Aquacel  dressing.  Drain site dressed separately.  She was then  brought   to recovery room in stable condition tolerating the procedure well.    Lanney Gins, PA-C was present for the entirety of the case involved from   preoperative positioning, perioperative retractor management, general   facilitation of the case, as well as primary wound closure as assistant.            Madlyn Frankel Charlann Boxer, M.D.            MDO/MEDQ  D:  12/23/2010  T:  12/23/2010  Job:  [id number]      Electronically Signed by Durene Romans M.D. on 12/29/2010 09:15:38 AM

## 2012-08-23 NOTE — Preoperative (Signed)
Beta Blockers   Reason not to administer Beta Blockers:Not Applicable 

## 2012-08-23 NOTE — Transfer of Care (Signed)
Immediate Anesthesia Transfer of Care Note  Patient: Amber Bolton  Procedure(s) Performed: Procedure(s): RIGHT TOTAL HIP ARTHROPLASTY ANTERIOR APPROACH (Right)  Patient Location: PACU  Anesthesia Type:Spinal  Level of Consciousness: awake, alert  and oriented  Airway & Oxygen Therapy: Patient Spontanous Breathing and Patient connected to face mask oxygen  Post-op Assessment: Report given to PACU RN and Post -op Vital signs reviewed and stable  Post vital signs: Reviewed and stable  Complications: No apparent anesthesia complications

## 2012-08-23 NOTE — Anesthesia Procedure Notes (Signed)
Spinal  Patient location during procedure: OR Start time: 08/23/2012 9:58 AM End time: 08/23/2012 10:03 AM Staffing Anesthesiologist: Lewie Loron R Performed by: anesthesiologist  Preanesthetic Checklist Completed: patient identified, site marked, surgical consent, pre-op evaluation, timeout performed, IV checked, risks and benefits discussed and monitors and equipment checked Spinal Block Patient position: sitting Prep: ChloraPrep Patient monitoring: heart rate, continuous pulse ox and blood pressure Location: L2-3 Injection technique: single-shot Needle Needle type: Quincke  Needle gauge: 22 G Needle length: 9 cm Assessment Sensory level: T8 Additional Notes Expiration date of kit checked and confirmed. Patient tolerated procedure well, without complications.

## 2012-08-23 NOTE — Progress Notes (Signed)
Utilization review completed.  

## 2012-08-23 NOTE — Plan of Care (Signed)
Problem: Consults Goal: Diagnosis- Total Joint Replacement Primary Total Hip     

## 2012-08-23 NOTE — Anesthesia Postprocedure Evaluation (Signed)
Anesthesia Post Note  Patient: Amber Bolton  Procedure(s) Performed: Procedure(s) (LRB): RIGHT TOTAL HIP ARTHROPLASTY ANTERIOR APPROACH (Right)  Anesthesia type: Spinal  Patient location: PACU  Post pain: Pain level controlled  Post assessment: Post-op Vital signs reviewed  Last Vitals: BP 168/67  Pulse 51  Temp(Src) 36.4 C (Oral)  Resp 13  SpO2 99%  Post vital signs: Reviewed  Level of consciousness: sedated  Complications: No apparent anesthesia complications

## 2012-08-24 ENCOUNTER — Encounter (HOSPITAL_COMMUNITY): Payer: Self-pay | Admitting: Orthopedic Surgery

## 2012-08-24 DIAGNOSIS — D500 Iron deficiency anemia secondary to blood loss (chronic): Secondary | ICD-10-CM | POA: Diagnosis not present

## 2012-08-24 DIAGNOSIS — E669 Obesity, unspecified: Secondary | ICD-10-CM | POA: Diagnosis present

## 2012-08-24 LAB — BASIC METABOLIC PANEL
CO2: 25 mEq/L (ref 19–32)
Calcium: 9.4 mg/dL (ref 8.4–10.5)
Chloride: 106 mEq/L (ref 96–112)
GFR calc Af Amer: 64 mL/min — ABNORMAL LOW (ref >90)
GFR calc non Af Amer: 55 mL/min — ABNORMAL LOW (ref >90)
Potassium: 4.3 mEq/L (ref 3.5–5.1)

## 2012-08-24 LAB — CBC
HCT: 34.6 % — ABNORMAL LOW (ref 36.0–46.0)
Hemoglobin: 11.3 g/dL — ABNORMAL LOW (ref 12.0–15.0)
MCV: 89.6 fL (ref 78.0–100.0)
WBC: 12.6 10*3/uL — ABNORMAL HIGH (ref 4.0–10.5)

## 2012-08-24 MED ORDER — TIZANIDINE HCL 4 MG PO CAPS: 4.0000 mg | ORAL_CAPSULE | Freq: Three times a day (TID) | ORAL | Status: DC | PRN

## 2012-08-24 MED ORDER — ASPIRIN 325 MG PO TBEC: 325.0000 mg | DELAYED_RELEASE_TABLET | Freq: Two times a day (BID) | ORAL | Status: AC

## 2012-08-24 MED ORDER — HYDROCODONE-ACETAMINOPHEN 7.5-325 MG PO TABS: 1.0000 | ORAL_TABLET | ORAL | Status: DC | PRN

## 2012-08-24 MED ORDER — DSS 100 MG PO CAPS: 100.0000 mg | ORAL_CAPSULE | Freq: Two times a day (BID) | ORAL | Status: DC

## 2012-08-24 MED ORDER — FERROUS SULFATE 325 (65 FE) MG PO TABS: 325.0000 mg | ORAL_TABLET | Freq: Three times a day (TID) | ORAL | Status: DC

## 2012-08-24 MED ORDER — POLYETHYLENE GLYCOL 3350 17 G PO PACK: 17.0000 g | PACK | Freq: Two times a day (BID) | ORAL | Status: DC

## 2012-08-24 NOTE — Evaluation (Signed)
Occupational Therapy Evaluation Patient Details Name: Amber Bolton MRN: [id number] DOB: 1929/08/06 Today's Date: 08/24/2012 Time: 4098-1191 OT Time Calculation (min): 14 min  OT Assessment / Plan / Recommendation Clinical Impression  This 79 year old female was admitted for R anterior direct THA.  All education was completed.  Pt does not need any further OT at this time    OT Assessment  Patient does not need any further OT services    Follow Up Recommendations  No OT follow up    Barriers to Discharge      Equipment Recommendations  3 in 1 bedside comode    Recommendations for Other Services    Frequency       Precautions / Restrictions Precautions Precautions: Fall Restrictions Weight Bearing Restrictions: No Other Position/Activity Restrictions: WBAT RLE   Pertinent Vitals/Pain R hip a little sore.  Repositioned and ice applied    ADL  Lower Body Bathing: Moderate assistance Where Assessed - Lower Body Bathing: Supported sit to stand Lower Body Dressing: Maximal assistance Where Assessed - Lower Body Dressing: Supported sit to stand Equipment Used: Agricultural consultant Transfers/Ambulation Related to ADLs: Pt had multiple family members in room.  Only performed sit to stand with min guard during eval ADL Comments: Reviewed tub readiness.  Pt has a seat in the tub.  Educated to reach only within comfort area for adls and sequence for pants. Family will assist. PT does not have a reacher at home, but will have 24/7.    OT Diagnosis:    OT Problem List:   OT Treatment Interventions:     OT Goals(Current goals can be found in the care plan section) Acute Rehab OT Goals Patient Stated Goal: to return home with family  Visit Information  Last OT Received On: 08/24/12 Assistance Needed: +1       Prior Functioning     Home Living Family/patient expects to be discharged to:: Private residence Living Arrangements: Children Available Help at Discharge: Family Type  of Home: House Home Access: Stairs to enter Secretary/administrator of Steps: 1 Entrance Stairs-Rails: None Home Layout: One level Home Equipment: Shower seat (3:1 and walker delivered to room) Prior Function Level of Independence: Needs assistance ADL's / Homemaking Assistance Needed: needed some assist for bathing.  Communication Communication: No difficulties         Vision/Perception     Cognition  Cognition Arousal/Alertness: Awake/alert Behavior During Therapy: WFL for tasks assessed/performed Overall Cognitive Status: Within Functional Limits for tasks assessed    Extremity/Trunk Assessment Upper Extremity Assessment Upper Extremity Assessment: Overall WFL for tasks assessed Lower Extremity Assessment Lower Extremity Assessment: RLE deficits/detail RLE Deficits / Details: Pt with generalized weakness in RLE, ankle motions WFL, hip add 2-/5 for bed mobility.  Cervical / Trunk Assessment Cervical / Trunk Assessment: Kyphotic      Sit to stand:  Min guard  Exercise     Balance     End of Session OT - End of Session Activity Tolerance: Patient tolerated treatment well Patient left: in chair;with call bell/phone within reach;with family/visitor present  GO     Clydine Parkison 08/24/2012, 11:17 AM Marica Otter, OTR/L [phone] 08/24/2012

## 2012-08-24 NOTE — Evaluation (Signed)
Physical Therapy Evaluation Patient Details Name: Amber Bolton MRN: [id number] DOB: [birth year] Today's Date: 08/24/2012 Time: 4098-1191 PT Time Calculation (min): 41 min  PT Assessment / Plan / Recommendation Clinical Impression  Pt presents s/p R THA (direct ant) POD 1 with decreased strength, ROM and mobility.  Tolerated OOB and ambulation in hallway and to/from restroom with min/guard assist for safety.  Pt will benefit from skilled PT in acute venue to address deficits.  PT recommends HHPT for follow up at D/C to maximize pts safety and function.     PT Assessment  Patient needs continued PT services    Follow Up Recommendations  Home health PT    Does the patient have the potential to tolerate intense rehabilitation      Barriers to Discharge        Equipment Recommendations  Rolling walker with 5" wheels    Recommendations for Other Services OT consult   Frequency 7X/week    Precautions / Restrictions Precautions Precautions: Fall Restrictions Weight Bearing Restrictions: No Other Position/Activity Restrictions: WBAT RLE   Pertinent Vitals/Pain 1/10, ice pack applied      Mobility  Bed Mobility Bed Mobility: Supine to Sit Supine to Sit: 4: Min assist;HOB elevated Details for Bed Mobility Assistance: Assist for RLE out of bed with min cues for hand placement on bed to self assist.  Transfers Transfers: Sit to Stand;Stand to Sit Sit to Stand: 4: Min guard;From elevated surface;With upper extremity assist;From bed Stand to Sit: 4: Min guard;With upper extremity assist;With armrests;To chair/3-in-1 Details for Transfer Assistance: Min/guard for safety with min cues for hand placement and safety.  Ambulation/Gait Ambulation/Gait Assistance: 4: Min guard Ambulation Distance (Feet): 50 Feet (and another 12') Assistive device: Rolling walker Ambulation/Gait Assistance Details: Cues for sequencing/technique with RW (pt performed step to initially, then performed  step through, however not with completely smooth movement).  Also cues for upright posture.  Gait Pattern: Step-to pattern;Decreased stride length;Trunk flexed;Antalgic Gait velocity: decreased Stairs: No Wheelchair Mobility Wheelchair Mobility: No    Exercises     PT Diagnosis: Difficulty walking;Generalized weakness;Acute pain  PT Problem List: Decreased strength;Decreased range of motion;Decreased activity tolerance;Decreased balance;Decreased mobility;Decreased coordination;Decreased knowledge of use of DME;Decreased safety awareness;Decreased knowledge of precautions;Pain PT Treatment Interventions: DME instruction;Gait training;Stair training;Functional mobility training;Therapeutic activities;Therapeutic exercise;Balance training;Patient/family education     PT Goals(Current goals can be found in the care plan section) Acute Rehab PT Goals Patient Stated Goal: to return home with family PT Goal Formulation: With patient/family Time For Goal Achievement: 08/27/12 Potential to Achieve Goals: Good  Visit Information  Last PT Received On: 08/24/12 Assistance Needed: +1       Prior Functioning  Home Living Family/patient expects to be discharged to:: Private residence Living Arrangements: Children Available Help at Discharge: Family Type of Home: House Home Access: Stairs to enter Secretary/administrator of Steps: 1 Entrance Stairs-Rails: None Home Layout: One level Home Equipment: None;Shower seat Prior Function Level of Independence: Needs assistance ADL's / Homemaking Assistance Needed: needed some assist for bathing.  Communication Communication: No difficulties    Cognition  Cognition Arousal/Alertness: Awake/alert Behavior During Therapy: WFL for tasks assessed/performed Overall Cognitive Status: Within Functional Limits for tasks assessed    Extremity/Trunk Assessment Lower Extremity Assessment Lower Extremity Assessment: RLE deficits/detail RLE Deficits /  Details: Pt with generalized weakness in RLE, ankle motions WFL, hip add 2-/5 for bed mobility.  Cervical / Trunk Assessment Cervical / Trunk Assessment: Kyphotic   Balance    End of  Session PT - End of Session Activity Tolerance: Patient tolerated treatment well Patient left: in chair;with call bell/phone within reach;with family/visitor present Nurse Communication: Mobility status  GP     Vista Deck 08/24/2012, 10:52 AM

## 2012-08-24 NOTE — Progress Notes (Signed)
   Subjective: 1 Day Post-Op Procedure(s) (LRB): RIGHT TOTAL HIP ARTHROPLASTY ANTERIOR APPROACH (Right)   Patient reports pain as mild, pain well controlled. No events throughout the night. Ready to be discharged home if she does well with PT and pain stays well controlled.   Objective:   VITALS:   Filed Vitals:   08/24/12 0625  BP: 146/71  Pulse: 60  Temp: 98.3 F (36.8 C)  Resp: 16    Neurovascular intact Dorsiflexion/Plantar flexion intact Incision: dressing C/D/I No cellulitis present Compartment soft  LABS  Recent Labs  08/24/12 0415  HGB 11.3*  HCT 34.6*  WBC 12.6*  PLT 236     Recent Labs  08/24/12 0415  NA 136  K 4.3  BUN 16  CREATININE 0.94  GLUCOSE 157*     Assessment/Plan: 1 Day Post-Op Procedure(s) (LRB): RIGHT TOTAL HIP ARTHROPLASTY ANTERIOR APPROACH (Right) HV drain d/c'ed Foley cath d/c'ed Advance diet Up with therapy D/C IV fluids Discharge home with home health, if he does well with PT and pain controlled.  Follow up in 2 weeks at [facility]. Follow up with OLIN,Jamilya Sarrazin D in 2 weeks.  Contact information:  [facility] [address], [address] [place] [address] [phone]    Expected ABLA  Treated with iron and will observe  Obese (BMI 30-39.9) Estimated body mass index is 30.47 kg/(m^2) as calculated from the following:   Height as of this encounter: 5' (1.524 m).   Weight as of this encounter: 70.761 kg (156 lb). Patient also counseled that weight may inhibit the healing process Patient counseled that losing weight will help with future health issues     Anastasio Auerbach. Fuad Forget   PAC  08/24/2012, 8:38 AM

## 2012-08-24 NOTE — Care Management Note (Signed)
    Page 1 of 2   08/24/2012     12:34:13 PM   CARE MANAGEMENT NOTE 08/24/2012  Patient:  Amber Bolton, Amber Bolton   Account Number:  [id number]  Date Initiated:  08/24/2012  Documentation initiated by:  Lorenda Ishihara  Subjective/Objective Assessment:   79 yo female admitted s/p right total hip. PTA lived at home with spouse.     Action/Plan:   Home when stable   Anticipated DC Date:  08/24/2012   Anticipated DC Plan:  HOME W HOME HEALTH SERVICES      DC Planning Services  CM consult      PAC Choice  DURABLE MEDICAL EQUIPMENT  HOME HEALTH   Choice offered to / List presented to:  C-1 Patient   DME arranged  3-N-1  Levan Hurst      DME agency  [facility]     HH arranged  HH-2 PT      [facility] agency  [facility]   Status of service:  Completed, signed off Medicare Important Message given?   (If response is "NO", the following Medicare IM given date fields will be blank) Date Medicare IM given:   Date Additional Medicare IM given:    Discharge Disposition:  HOME W HOME HEALTH SERVICES  Per UR Regulation:  Reviewed for med. necessity/level of care/duration of stay  If discussed at Long Length of Stay Meetings, dates discussed:    Comments:

## 2012-08-24 NOTE — Discharge Summary (Signed)
Physician Discharge Summary  Patient ID: Amber Bolton MRN: [id number] DOB/AGE: August 07, 1929 79 y.o.  Admit date: 08/23/2012 Discharge date: 08/24/2012   Procedures:  Procedure(s) (LRB): RIGHT TOTAL HIP ARTHROPLASTY ANTERIOR APPROACH (Right)  Attending Physician:  Dr. Durene Bolton   Admission Diagnoses:   Right hip OA / pain  Discharge Diagnoses:  Principal Problem:   S/P right THA, AA Active Problems:   Expected blood loss anemia   Obesity (BMI 30-39.9)  Past Medical History  Diagnosis Date  . Hypertension   . Hypercalcemia   . Hyperlipemia   . Sleep apnea     STATES SHE HAS CPAP - BUT HAS NOT USED IN THE LAST 2 YRS  . Arthritis     IN RIGHT HIP AND FINGERS AND BACK    HPI: Amber Bolton, 79 y.o. female, has a history of pain and functional disability in the right hip(s) due to arthritis and patient has failed non-surgical conservative treatments for greater than 12 weeks to include NSAID's and/or analgesics, use of assistive devices and activity modification. Onset of symptoms was gradual starting 2 years ago with rapidlly worsening course since that time.The patient noted no past surgery on the right hip(s). Patient currently rates pain in the right hip at 8 out of 10 with activity. Patient has night pain, worsening of pain with activity and weight bearing, trendelenberg gait, pain that interfers with activities of daily living and pain with passive range of motion. Patient has evidence of periarticular osteophytes and joint space narrowing by imaging studies. This condition presents safety issues increasing the risk of falls. There is no current active signs of infection. Risks, benefits and expectations were discussed with the patient. Patient understand the risks, benefits and expectations and wishes to proceed with surgery.   PCP: Amber Starks, MD   Discharged Condition: good  Hospital Course:  Patient underwent the above stated procedure on 08/23/2012. Patient  tolerated the procedure well and brought to the recovery room in good condition and subsequently to the floor.  POD #1 BP: 146/71 ; Pulse: 60 ; Temp: 98.3 F (36.8 C) ; Resp: 16  Pt's foley was removed, as well as the hemovac drain removed. IV was changed to a saline lock. Patient reports pain as mild, pain well controlled. No events throughout the night. Ready to be discharged home. Neurovascular intact, dorsiflexion/plantar flexion intact, incision: dressing C/D/I, no cellulitis present and compartment soft.   LABS  Basename  08/24/12    0415   HGB  11.3  HCT  34.6    Discharge Exam: General appearance: alert, cooperative and no distress Extremities: Homans sign is negative, no sign of DVT, no edema, redness or tenderness in the calves or thighs and no ulcers, gangrene or trophic changes  Disposition:   Home or Self Care with follow up in 2 weeks   Follow-up Information   Follow up with Amber Pal, MD. Schedule an appointment as soon as possible for a visit in 2 weeks.   Contact information:   [address] [place] [address] [phone]       Discharge Orders   Future Orders Complete By Expires     Call MD / Call 911  As directed     Comments:      If you experience chest pain or shortness of breath, CALL 911 and be transported to the hospital emergency room.  If you develope a fever above 101 F, pus (white drainage) or increased drainage or  redness at the wound, or calf pain, call your surgeon's office.    Change dressing  As directed     Comments:      Maintain surgical dressing for 10-14 days, then replace with 4x4 guaze and tape. Keep the area dry and clean.    Constipation Prevention  As directed     Comments:      Drink plenty of fluids.  Prune juice may be helpful.  You may use a stool softener, such as Colace (over the counter) 100 mg twice a day.  Use MiraLax (over the counter) for constipation as needed.    Diet - low sodium heart healthy   As directed     Discharge instructions  As directed     Comments:      Maintain surgical dressing for 10-14 days, then replace with gauze and tape. Keep the area dry and clean until follow up. Follow up in 2 weeks at [facility]. Call with any questions or concerns.    Increase activity slowly as tolerated  As directed     TED hose  As directed     Comments:      Use stockings (TED hose) for 2 weeks on both leg(s).  You may remove them at night for sleeping.    Weight bearing as tolerated  As directed          Medication List    STOP taking these medications       traMADol 50 MG tablet  Commonly known as:  ULTRAM      TAKE these medications       ALPRAZolam 0.5 MG tablet  Commonly known as:  XANAX  Take 0.5 mg by mouth at bedtime as needed for sleep.     amLODipine-valsartan 5-320 MG per tablet  Commonly known as:  EXFORGE  Take 1 tablet by mouth every morning.     aspirin 325 MG EC tablet  Take 1 tablet (325 mg total) by mouth 2 (two) times daily.     DSS 100 MG Caps  Take 100 mg by mouth 2 (two) times daily.     ferrous sulfate 325 (65 FE) MG tablet  Take 1 tablet (325 mg total) by mouth 3 (three) times daily after meals.     HYDROcodone-acetaminophen 7.5-325 MG per tablet  Commonly known as:  NORCO  Take 1-2 tablets by mouth every 4 (four) hours as needed for pain.     multivitamin with minerals Tabs  Take 1 tablet by mouth daily.     polyethylene glycol packet  Commonly known as:  MIRALAX / GLYCOLAX  Take 17 g by mouth 2 (two) times daily.     tiZANidine 4 MG capsule  Commonly known as:  ZANAFLEX  Take 1 capsule (4 mg total) by mouth 3 (three) times daily as needed for muscle spasms. Muscle spasms         Signed: Anastasio Bolton. Amber Bolton   PAC  08/24/2012, 3:56 PM

## 2012-08-24 NOTE — Progress Notes (Signed)
Physical Therapy Treatment Patient Details Name: Amber Bolton MRN: [id number] DOB: 1929/03/31 Today's Date: 08/24/2012 Time: 6295-2841 PT Time Calculation (min): 69 min  PT Assessment / Plan / Recommendation  PT Comments   Pt doing very well with all mobility.  Performed stair training and exercises, as well as ambulation with pts sister present.  Both verbalize understanding and are ready for D/C.   Follow Up Recommendations  Home health PT     Does the patient have the potential to tolerate intense rehabilitation     Barriers to Discharge        Equipment Recommendations  Rolling walker with 5" wheels    Recommendations for Other Services OT consult  Frequency 7X/week   Progress towards PT Goals Progress towards PT goals: Goals met/education completed, patient discharged from PT  Plan Current plan remains appropriate    Precautions / Restrictions Precautions Precautions: None Restrictions Weight Bearing Restrictions: No Other Position/Activity Restrictions: WBAT RLE   Pertinent Vitals/Pain 210, soreness    Mobility  Bed Mobility Bed Mobility: Sit to Supine Supine to Sit: 4: Min assist;HOB elevated Sit to Supine: 4: Min assist Details for Bed Mobility Assistance: Assist for RLE into bed with cues for adjusting hips once in bed.  Transfers Transfers: Stand to Sit Sit to Stand: 4: Min guard;From chair/3-in-1;With upper extremity assist Stand to Sit: 5: Supervision;To bed Details for Transfer Assistance: Supervision for safety with cues for hand placement.  Ambulation/Gait Ambulation/Gait Assistance: 5: Supervision Ambulation Distance (Feet): 150 Feet Assistive device: Rolling walker Ambulation/Gait Assistance Details: min cues for sequencing/technique with RW.  Pts gait improved this afternoon vs this morning with smoother gait pattern in step through.  Gait Pattern: Step-to pattern;Decreased stride length;Trunk flexed;Antalgic Gait velocity: decreased Stairs:  Yes Stairs Assistance: 4: Min guard Stair Management Technique: No rails;Step to pattern;Backwards;Forwards;With walker Number of Stairs: 1 Wheelchair Mobility Wheelchair Mobility: No    Exercises Total Joint Exercises Ankle Circles/Pumps: AROM;Both;20 reps Quad Sets: AROM;Right;10 reps Short Arc Quad: AROM;Right;10 reps Heel Slides: AAROM;Right;10 reps Hip ABduction/ADduction: AROM;Right;10 reps   PT Diagnosis: Difficulty walking;Generalized weakness;Acute pain  PT Problem List: Decreased strength;Decreased range of motion;Decreased activity tolerance;Decreased balance;Decreased mobility;Decreased coordination;Decreased knowledge of use of DME;Decreased safety awareness;Decreased knowledge of precautions;Pain PT Treatment Interventions: DME instruction;Gait training;Stair training;Functional mobility training;Therapeutic activities;Therapeutic exercise;Balance training;Patient/family education   PT Goals (current goals can now be found in the care plan section) Acute Rehab PT Goals Patient Stated Goal: to return home with family PT Goal Formulation: With patient/family Time For Goal Achievement: 08/27/12 Potential to Achieve Goals: Good  Visit Information  Last PT Received On: 08/24/12 Assistance Needed: +1    Subjective Data  Subjective: I want to go home today Patient Stated Goal: to return home with family   Cognition  Cognition Arousal/Alertness: Awake/alert Behavior During Therapy: WFL for tasks assessed/performed Overall Cognitive Status: Within Functional Limits for tasks assessed    Balance  Balance Balance Assessed: Yes Dynamic Standing Balance Dynamic Standing - Balance Support: During functional activity;No upper extremity supported Dynamic Standing - Level of Assistance: 5: Stand by assistance Dynamic Standing - Balance Activities: Lateral lean/weight shifting;Forward lean/weight shifting Dynamic Standing - Comments: Pt able to stand in restroom x approx 10  mins to address self care issues and don gown.   End of Session PT - End of Session Activity Tolerance: Patient tolerated treatment well Patient left: in bed;with call bell/phone within reach;with family/visitor present Nurse Communication: Mobility status   GP     Heraclio Seidman, Irving Burton  Ann 08/24/2012, 2:43 PM

## 2015-05-07 DIAGNOSIS — E119 Type 2 diabetes mellitus without complications: Secondary | ICD-10-CM | POA: Insufficient documentation

## 2015-05-07 DIAGNOSIS — D509 Iron deficiency anemia, unspecified: Secondary | ICD-10-CM | POA: Insufficient documentation

## 2015-05-07 DIAGNOSIS — G4733 Obstructive sleep apnea (adult) (pediatric): Secondary | ICD-10-CM | POA: Insufficient documentation

## 2015-05-07 DIAGNOSIS — E785 Hyperlipidemia, unspecified: Secondary | ICD-10-CM | POA: Insufficient documentation

## 2015-12-31 ENCOUNTER — Emergency Department (HOSPITAL_COMMUNITY)
Admission: EM | Admit: 2015-12-31 | Discharge: 2015-12-31 | Disposition: A | Discharge status: Home / Self Care | Payer: Medicare Other | Attending: Emergency Medicine | Admitting: Emergency Medicine

## 2015-12-31 ENCOUNTER — Encounter (HOSPITAL_COMMUNITY): Payer: Self-pay

## 2015-12-31 ENCOUNTER — Emergency Department (HOSPITAL_COMMUNITY): Payer: Medicare Other

## 2015-12-31 DIAGNOSIS — K529 Noninfective gastroenteritis and colitis, unspecified: Secondary | ICD-10-CM | POA: Insufficient documentation

## 2015-12-31 DIAGNOSIS — R197 Diarrhea, unspecified: Secondary | ICD-10-CM | POA: Diagnosis present

## 2015-12-31 DIAGNOSIS — Z96641 Presence of right artificial hip joint: Secondary | ICD-10-CM | POA: Insufficient documentation

## 2015-12-31 DIAGNOSIS — I10 Essential (primary) hypertension: Secondary | ICD-10-CM | POA: Diagnosis not present

## 2015-12-31 LAB — CBC
HCT: 48.6 % — ABNORMAL HIGH (ref 36.0–46.0)
Hemoglobin: 16.1 g/dL — ABNORMAL HIGH (ref 12.0–15.0)
MCH: 30.8 pg (ref 26.0–34.0)
MCHC: 33.1 g/dL (ref 30.0–36.0)
MCV: 93.1 fL (ref 78.0–100.0)
PLATELETS: 181 10*3/uL (ref 150–400)
RBC: 5.22 MIL/uL — AB (ref 3.87–5.11)
RDW: 13.2 % (ref 11.5–15.5)
WBC: 6.2 10*3/uL (ref 4.0–10.5)

## 2015-12-31 LAB — COMPREHENSIVE METABOLIC PANEL
ALBUMIN: 3.8 g/dL (ref 3.5–5.0)
ALK PHOS: 75 U/L (ref 38–126)
ALT: 19 U/L (ref 14–54)
AST: 38 U/L (ref 15–41)
Anion gap: 9 (ref 5–15)
BUN: 15 mg/dL (ref 6–20)
CALCIUM: 9.9 mg/dL (ref 8.9–10.3)
CHLORIDE: 103 mmol/L (ref 101–111)
CO2: 25 mmol/L (ref 22–32)
CREATININE: 1.1 mg/dL — AB (ref 0.44–1.00)
GFR calc Af Amer: 52 mL/min — ABNORMAL LOW (ref >60)
GFR calc non Af Amer: 45 mL/min — ABNORMAL LOW (ref >60)
GLUCOSE: 123 mg/dL — AB (ref 65–99)
Potassium: 5.3 mmol/L — ABNORMAL HIGH (ref 3.5–5.1)
SODIUM: 137 mmol/L (ref 135–145)
Total Bilirubin: 1.7 mg/dL — ABNORMAL HIGH (ref 0.3–1.2)
Total Protein: 6.8 g/dL (ref 6.5–8.1)

## 2015-12-31 LAB — URINALYSIS, ROUTINE W REFLEX MICROSCOPIC
BILIRUBIN URINE: NEGATIVE
GLUCOSE, UA: NEGATIVE mg/dL
HGB URINE DIPSTICK: NEGATIVE
Ketones, ur: NEGATIVE mg/dL
Leukocytes, UA: NEGATIVE
Nitrite: NEGATIVE
PROTEIN: NEGATIVE mg/dL
Specific Gravity, Urine: 1.004 — ABNORMAL LOW (ref 1.005–1.030)
pH: 7 (ref 5.0–8.0)

## 2015-12-31 LAB — LIPASE, BLOOD: LIPASE: 28 U/L (ref 11–51)

## 2015-12-31 LAB — I-STAT TROPONIN, ED: TROPONIN I, POC: 0 ng/mL (ref 0.00–0.08)

## 2015-12-31 MED ORDER — ONDANSETRON HCL 8 MG PO TABS: 8.0000 mg | ORAL_TABLET | Freq: Three times a day (TID) | ORAL | 0 refills | Status: DC | PRN

## 2015-12-31 MED ORDER — ONDANSETRON 4 MG PO TBDP
4.0000 mg | ORAL_TABLET | Freq: Once | ORAL | Status: AC
  Administered 2015-12-31: 4 mg via ORAL
  Filled 2015-12-31: qty 1

## 2015-12-31 NOTE — ED Notes (Signed)
Pt able to ambulate w/ steady gait to restroom. 

## 2015-12-31 NOTE — ED Triage Notes (Signed)
BIB [facility] from home. Pt. C/o chills X1 day and nausea and diarrhea that started today as well as headache. Also c/o abdominal pain with EMS. Vitals in field HR 82, BP 162/106 and 98% RA.

## 2015-12-31 NOTE — ED Notes (Addendum)
MD Jacubowitz notified of BP of 204/90 and pt. Requesting something to drink.

## 2015-12-31 NOTE — ED Provider Notes (Addendum)
[facility] Provider Note   CSN: [id number] Arrival date & time: 12/31/15  1609     History   Chief Complaint Chief Complaint  Patient presents with  . Abdominal Pain    HPI Amber Bolton is a 82 y.o. female.  HPI Patient awakened 8:30 AM today with nausea followed by 3 episodes of watery diarrhea and vague diffuse abdominal discomfort which felt like "a bubbling"in her abdomen. Her her abdominal discomfort and nausea settled down after she drank a Coca-Cola. Approximately 2:30 PM nausea recurred lasting several minutes and resolved spontaneously without treatment. She is presently asymptomatic. She had no vomiting no fever no chest pain or shortness of breath no other associated symptoms. Nothing made symptoms better or worse. No recent travel or antibiotic use Past Medical History:  Diagnosis Date  . Arthritis    IN RIGHT HIP AND FINGERS AND BACK  . Hypercalcemia   . Hyperlipemia   . Hypertension   . Sleep apnea    STATES SHE HAS CPAP - BUT HAS NOT USED IN THE LAST 2 YRS    Patient Active Problem List   Diagnosis Date Noted  . Expected blood loss anemia 08/24/2012  . Obesity (BMI 30-39.9) 08/24/2012  . S/P right THA, AA 08/23/2012    Past Surgical History:  Procedure Laterality Date  . ABDOMINAL HYSTERECTOMY  1978  . HEMORRHOID SURGERY  1960'S  . TOTAL HIP ARTHROPLASTY Right 08/23/2012   Procedure: RIGHT TOTAL HIP ARTHROPLASTY ANTERIOR APPROACH;  Surgeon: [name], MD;  Location: [facility];  Service: Orthopedics;  Laterality: Right;    OB History    No data available       Home Medications    Prior to Admission medications   Medication Sig Start Date End Date Taking? Authorizing Provider  ALPRAZolam Prudy Feeler) 0.5 MG tablet Take 0.5 mg by mouth at bedtime as needed for sleep.    Historical Provider, MD  amLODipine-valsartan (EXFORGE) 5-320 MG per tablet Take 1 tablet by mouth every morning.    Historical Provider, MD  docusate sodium 100 MG CAPS  Take 100 mg by mouth 2 (two) times daily. 08/24/12   Lanney Gins, PA-C  ferrous sulfate 325 (65 FE) MG tablet Take 1 tablet (325 mg total) by mouth 3 (three) times daily after meals. 08/24/12   Lanney Gins, PA-C  HYDROcodone-acetaminophen (NORCO) 7.5-325 MG per tablet Take 1-2 tablets by mouth every 4 (four) hours as needed for pain. 08/24/12   Lanney Gins, PA-C  Multiple Vitamin (MULTIVITAMIN WITH MINERALS) TABS Take 1 tablet by mouth daily.    Historical Provider, MD  polyethylene glycol (MIRALAX / GLYCOLAX) packet Take 17 g by mouth 2 (two) times daily. 08/24/12   Lanney Gins, PA-C  tiZANidine (ZANAFLEX) 4 MG capsule Take 1 capsule (4 mg total) by mouth 3 (three) times daily as needed for muscle spasms. Muscle spasms 08/24/12   Lanney Gins, PA-C    Family History No family history on file.  Social History Social History  Substance Use Topics  . Smoking status: Never Smoker  . Smokeless tobacco: Never Used  . Alcohol use No     Allergies   Shellfish allergy and Penicillins   Review of Systems Review of Systems  Constitutional: Negative.   HENT: Negative.   Respiratory: Negative.   Cardiovascular: Negative.   Gastrointestinal: Positive for abdominal pain, diarrhea and nausea.  Musculoskeletal: Negative.   Skin: Negative.   Neurological: Negative.   Psychiatric/Behavioral: Negative.   All other systems reviewed  and are negative.    Physical Exam Updated Vital Signs BP (!) 212/80 (BP Location: Left Arm)   Pulse 77   Temp 98 F (36.7 C) (Oral)   Resp 18   Ht 5' (1.524 m)   Wt 156 lb (70.8 kg)   SpO2 99%   BMI 30.47 kg/m   Physical Exam  Constitutional: She appears well-developed and well-nourished.  HENT:  Head: Normocephalic and atraumatic.  Eyes: Conjunctivae are normal. Pupils are equal, round, and reactive to light.  Neck: Neck supple. No JVD present. No tracheal deviation present. No thyromegaly present.  No bruit  Cardiovascular: Normal rate,  regular rhythm, normal heart sounds and intact distal pulses.   No murmur heard. Pulmonary/Chest: Effort normal and breath sounds normal.  Abdominal: Soft. Bowel sounds are normal. She exhibits no distension. There is no tenderness.  Musculoskeletal: Normal range of motion. She exhibits no edema or tenderness.  Neurological: She is alert. Coordination normal.  Skin: Skin is warm and dry. No rash noted.  Psychiatric: She has a normal mood and affect.  Nursing note and vitals reviewed.    ED Treatments / Results  Labs (all labs ordered are listed, but only abnormal results are displayed) Labs Reviewed  LIPASE, BLOOD  COMPREHENSIVE METABOLIC PANEL  CBC  URINALYSIS, ROUTINE W REFLEX MICROSCOPIC (NOT AT [facility])    EKG  EKG Interpretation  Date/Time:  Tuesday December 31 2015 16:47:17 EDT Ventricular Rate:  77 PR Interval:    QRS Duration: 92 QT Interval:  383 QTC Calculation: 434 R Axis:   -21 Text Interpretation:  Sinus rhythm Borderline left axis deviation Low voltage, precordial leads Probable anteroseptal infarct, old No old tracing to compare Confirmed by Ethelda ChickJACUBOWITZ  MD, Senora Lacson [phone]([id number]) on 12/31/2015 4:51:49 PM        Radiology No results found.  Procedures Procedures (including critical care time)  Medications Ordered in ED Medications - No data to display X-rays viewed by me Results for orders placed or performed during the hospital encounter of 12/31/15  Lipase, blood  Result Value Ref Range   Lipase 28 11 - 51 U/L  Comprehensive metabolic panel  Result Value Ref Range   Sodium 137 135 - 145 mmol/L   Potassium 5.3 (H) 3.5 - 5.1 mmol/L   Chloride 103 101 - 111 mmol/L   CO2 25 22 - 32 mmol/L   Glucose, Bld 123 (H) 65 - 99 mg/dL   BUN 15 6 - 20 mg/dL   Creatinine, Ser 9.621.10 (H) 0.44 - 1.00 mg/dL   Calcium 9.9 8.9 - 95.210.3 mg/dL   Total Protein 6.8 6.5 - 8.1 g/dL   Albumin 3.8 3.5 - 5.0 g/dL   AST 38 15 - 41 U/L   ALT 19 14 - 54 U/L   Alkaline Phosphatase 75 38 -  126 U/L   Total Bilirubin 1.7 (H) 0.3 - 1.2 mg/dL   GFR calc non Af Amer 45 (L) >60 mL/min   GFR calc Af Amer 52 (L) >60 mL/min   Anion gap 9 5 - 15  CBC  Result Value Ref Range   WBC 6.2 4.0 - 10.5 K/uL   RBC 5.22 (H) 3.87 - 5.11 MIL/uL   Hemoglobin 16.1 (H) 12.0 - 15.0 g/dL   HCT 84.148.6 (H) 32.436.0 - 40.146.0 %   MCV 93.1 78.0 - 100.0 fL   MCH 30.8 26.0 - 34.0 pg   MCHC 33.1 30.0 - 36.0 g/dL   RDW 02.713.2 25.311.5 - 66.415.5 %   Platelets  181 150 - 400 K/uL  Urinalysis, Routine w reflex microscopic  Result Value Ref Range   Color, Urine YELLOW YELLOW   APPearance CLEAR CLEAR   Specific Gravity, Urine 1.004 (L) 1.005 - 1.030   pH 7.0 5.0 - 8.0   Glucose, UA NEGATIVE NEGATIVE mg/dL   Hgb urine dipstick NEGATIVE NEGATIVE   Bilirubin Urine NEGATIVE NEGATIVE   Ketones, ur NEGATIVE NEGATIVE mg/dL   Protein, ur NEGATIVE NEGATIVE mg/dL   Nitrite NEGATIVE NEGATIVE   Leukocytes, UA NEGATIVE NEGATIVE  I-stat troponin, ED  Result Value Ref Range   Troponin i, poc 0.00 0.00 - 0.08 ng/mL   Comment 3           Dg Abd Acute W/chest  Result Date: 12/31/2015 CLINICAL DATA:  82 year old female with generalized abdominal pain, nausea and vomiting and diarrhea since this morning. Initial encounter. EXAM: DG ABDOMEN ACUTE W/ 1V CHEST COMPARISON:  08/12/2012 chest x-ray. FINDINGS: Minimal scarring left lung base. No infiltrate, congestive heart failure or pneumothorax. Markedly tortuous thoracic aorta. Heart size top-normal. Scoliosis thoracic and lumbar spine. Gas-filled centrally located small bowel loops slightly prominent measuring up to 3.1 cm. No free intraperitoneal air. IMPRESSION: Slightly prominent size gas-filled centrally located small bowel loops of indeterminate etiology. This may be related to mild enteritis. No definitive findings of bowel obstruction. Markedly tortuous thoracic aorta. Heart size top-normal. Electronically Signed   By: Lacy DuverneySteven  Olson M.D.   On: 12/31/2015 18:28    Initial Impression /  Assessment and Plan / ED Course  I have reviewed the triage vital signs and the nursing notes.  Pertinent labs & imaging results that were available during my care of the patient were reviewed by me and considered in my medical decision making (see chart for details).  Clinical Course   4:50 PM complains of nausea. Oral Zofran ordered 9:05 PM patient feels ready to go home. She is not lightheaded on standing. Denies abdominal pain.She was able to eat and drink while here without nausea or vomiting She denies nausea or treatment with oral Zofran. Plan encourage oral hydration. Imodium for diarrhea. Avoid dairy. Blood pressure recheck . Symptoms consistent with enteritis.  Final Clinical Impressions(s) / ED Diagnoses  Diagnosis #1 enteritis #2 elevated blood pressure Final diagnoses:  None    New Prescriptions New Prescriptions   No medications on file     Doug SouSam Becket Wecker, MD 12/31/15 2109    Doug SouSam Liadan Guizar, MD 12/31/15 2110

## 2015-12-31 NOTE — Discharge Instructions (Signed)
Avoid milk or foods containing milk such as cheese or ice cream all having diarrhea. Take Imodium as directed for diarrhea. Take the medication prescribed as needed for nausea. Make sure that you drink at least six 8 ounce glasses of water each day. See your primary care physician if not feeling better in the next 2 or 3 days. Your blood pressure should be rechecked within a week at your doctor's office. Today's was elevated at 162/91

## 2016-01-01 ENCOUNTER — Telehealth (HOSPITAL_BASED_OUTPATIENT_CLINIC_OR_DEPARTMENT_OTHER): Payer: Self-pay | Admitting: *Deleted

## 2016-12-05 ENCOUNTER — Encounter (HOSPITAL_COMMUNITY): Payer: Self-pay

## 2016-12-05 ENCOUNTER — Ambulatory Visit (HOSPITAL_COMMUNITY)
Admission: EM | Admit: 2016-12-05 | Discharge: 2016-12-05 | Disposition: A | Discharge status: Home / Self Care | Payer: Medicare Other | Attending: Family Medicine | Admitting: Family Medicine

## 2016-12-05 DIAGNOSIS — N3090 Cystitis, unspecified without hematuria: Secondary | ICD-10-CM

## 2016-12-05 DIAGNOSIS — I10 Essential (primary) hypertension: Secondary | ICD-10-CM

## 2016-12-05 LAB — POCT URINALYSIS DIP (DEVICE)
BILIRUBIN URINE: NEGATIVE
Glucose, UA: NEGATIVE mg/dL
KETONES UR: NEGATIVE mg/dL
Nitrite: NEGATIVE
PROTEIN: NEGATIVE mg/dL
Urobilinogen, UA: 0.2 mg/dL (ref 0.0–1.0)
pH: 7 (ref 5.0–8.0)

## 2016-12-05 MED ORDER — SULFAMETHOXAZOLE-TRIMETHOPRIM 800-160 MG PO TABS: 1.0000 | ORAL_TABLET | Freq: Two times a day (BID) | ORAL | 0 refills | Status: AC

## 2016-12-05 MED ORDER — SULFAMETHOXAZOLE-TRIMETHOPRIM 800-160 MG PO TABS: 1.0000 | ORAL_TABLET | Freq: Two times a day (BID) | ORAL | 0 refills | Status: DC

## 2016-12-05 NOTE — ED Triage Notes (Signed)
Pt presents today with urinary urgency, frequency and dysuria that has been going on for 3 days.

## 2016-12-05 NOTE — ED Provider Notes (Signed)
  [facility]    ASSESSMENT & PLAN:  1. Cystitis   2. Essential hypertension     Meds ordered this encounter  Medications  . sulfamethoxazole-trimethoprim (BACTRIM DS,SEPTRA DS) 800-160 MG tablet    Sig: Take 1 tablet by mouth 2 (two) times daily.    Dispense:  10 tablet    Refill:  0    Urine culture sent. Will notify patient when results available. Will follow up with her PCP or here if not showing improvement over the next 48 hours, sooner if needed.  Will also f/u with PCP concerning elevated BP.  Outlined signs and symptoms indicating need for more acute intervention. Patient verbalized understanding. After Visit Summary given.  SUBJECTIVE:  FLANNERY CAVALLERO is a 83 y.o. female who complains of urinary frequency, urgency and dysuria for the past 3 days. No flank pain, fever, chills, abnormal vaginal discharge or bleeding. Hematuria: not present.  Normal PO intake. No flank or abdominal pain. No self treatment.  LMP: No LMP recorded. Patient is postmenopausal.  ROS: As in HPI.  OBJECTIVE:  Vitals:   12/05/16 1612  BP: (!) 210/105  Pulse: 96  Resp: 16  Temp: 98.3 F (36.8 C)  TempSrc: Oral  SpO2: 97%    Appears well, in no apparent distress. Abdomen is soft without tenderness, guarding, mass, rebound or organomegaly. No CVA tenderness or inguinal adenopathy noted.  Labs Reviewed  POCT URINALYSIS DIP (DEVICE) - Abnormal; Notable for the following:       Result Value   Hgb urine dipstick MODERATE (*)    Leukocytes, UA SMALL (*)    All other components within normal limits  URINE CULTURE    Allergies  Allergen Reactions  . Penicillins Rash  . Shellfish Allergy Itching    MAKES MOUTH ITCH     Past Medical History:  Diagnosis Date  . Arthritis    IN RIGHT HIP AND FINGERS AND BACK  . Hypercalcemia   . Hyperlipemia   . Hypertension   . Sleep apnea    STATES SHE HAS CPAP - BUT HAS NOT USED IN THE LAST 2 YRS   Social History   Social  History  . Marital status: Married    Spouse name: N/A  . Number of children: N/A  . Years of education: N/A   Occupational History  . Not on file.   Social History Main Topics  . Smoking status: Never Smoker  . Smokeless tobacco: Never Used  . Alcohol use No  . Drug use: No  . Sexual activity: Not on file   Other Topics Concern  . Not on file   Social History Narrative  . No narrative on file       Mardella Layman, MD 12/07/16 [id number]

## 2019-06-24 ENCOUNTER — Ambulatory Visit: Payer: Medicare Other | Attending: Internal Medicine

## 2019-06-24 DIAGNOSIS — Z23 Encounter for immunization: Secondary | ICD-10-CM

## 2019-06-24 NOTE — Progress Notes (Signed)
   Covid-19 Vaccination Clinic  Name:  Amber Bolton    MRN: [id number] DOB: 1930/01/17  06/24/2019  Amber Bolton was observed post Covid-19 immunization for 30 minutes based on pre-vaccination screening without incident. She was provided with Vaccine Information Sheet and instruction to access the V-Safe system.   Amber Bolton was instructed to call 911 with any severe reactions post vaccine: Marland Kitchen Difficulty breathing  . Swelling of face and throat  . A fast heartbeat  . A bad rash all over body  . Dizziness and weakness   Immunizations Administered    Name Date Dose VIS Date Route   Pfizer COVID-19 Vaccine 06/24/2019 10:47 AM 0.3 mL 04/26/2018 Intramuscular   Manufacturer: [employer], [employer]   Lot: [id number]   NDC: [address]-8

## 2019-07-17 ENCOUNTER — Ambulatory Visit: Payer: Medicare Other | Attending: Internal Medicine

## 2020-11-21 ENCOUNTER — Emergency Department (HOSPITAL_BASED_OUTPATIENT_CLINIC_OR_DEPARTMENT_OTHER): Payer: Medicare Other | Admitting: Radiology

## 2020-11-21 ENCOUNTER — Other Ambulatory Visit: Payer: Self-pay

## 2020-11-21 ENCOUNTER — Encounter (HOSPITAL_BASED_OUTPATIENT_CLINIC_OR_DEPARTMENT_OTHER): Payer: Self-pay | Admitting: *Deleted

## 2020-11-21 ENCOUNTER — Emergency Department (HOSPITAL_BASED_OUTPATIENT_CLINIC_OR_DEPARTMENT_OTHER)
Admission: EM | Admit: 2020-11-21 | Discharge: 2020-11-21 | Disposition: A | Discharge status: Home / Self Care | Payer: Medicare Other | Attending: Emergency Medicine | Admitting: Emergency Medicine

## 2020-11-21 ENCOUNTER — Emergency Department (HOSPITAL_BASED_OUTPATIENT_CLINIC_OR_DEPARTMENT_OTHER): Payer: Medicare Other

## 2020-11-21 DIAGNOSIS — Y9289 Other specified places as the place of occurrence of the external cause: Secondary | ICD-10-CM | POA: Diagnosis not present

## 2020-11-21 DIAGNOSIS — S59911A Unspecified injury of right forearm, initial encounter: Secondary | ICD-10-CM | POA: Insufficient documentation

## 2020-11-21 DIAGNOSIS — S0993XA Unspecified injury of face, initial encounter: Secondary | ICD-10-CM | POA: Diagnosis present

## 2020-11-21 DIAGNOSIS — W19XXXA Unspecified fall, initial encounter: Secondary | ICD-10-CM | POA: Diagnosis not present

## 2020-11-21 DIAGNOSIS — M25531 Pain in right wrist: Secondary | ICD-10-CM | POA: Insufficient documentation

## 2020-11-21 DIAGNOSIS — R519 Headache, unspecified: Secondary | ICD-10-CM | POA: Diagnosis not present

## 2020-11-21 DIAGNOSIS — Z79899 Other long term (current) drug therapy: Secondary | ICD-10-CM | POA: Diagnosis not present

## 2020-11-21 DIAGNOSIS — Z96641 Presence of right artificial hip joint: Secondary | ICD-10-CM | POA: Insufficient documentation

## 2020-11-21 DIAGNOSIS — I10 Essential (primary) hypertension: Secondary | ICD-10-CM | POA: Diagnosis not present

## 2020-11-21 MED ORDER — FENTANYL CITRATE PF 50 MCG/ML IJ SOSY
25.0000 ug | PREFILLED_SYRINGE | Freq: Once | INTRAMUSCULAR | Status: AC
  Administered 2020-11-21: 25 ug via INTRAVENOUS
  Filled 2020-11-21: qty 1

## 2020-11-21 MED ORDER — ACETAMINOPHEN 325 MG PO TABS: 650.0000 mg | ORAL_TABLET | Freq: Four times a day (QID) | ORAL | 0 refills | Status: DC | PRN

## 2020-11-21 NOTE — ED Triage Notes (Signed)
Daughter pt fell about 0100 Wednesday morning. Rt hand pain with swelling, pt would not let family bring her to hospital, small lac to rt lat eye.

## 2020-11-21 NOTE — ED Notes (Signed)
Purwick placed, pt voided 240cc clear urine

## 2020-11-21 NOTE — ED Notes (Signed)
Pt needed more support for Wrist, Hand and Thumb then what the Ace Bandage could give. Applied a Thumb Wrist Splint to Pt to give Pt for better support, care and safety.

## 2020-11-21 NOTE — Discharge Instructions (Signed)
Keep right wrist/forearm elevated, ice, motrin/tylenol for pain

## 2020-11-21 NOTE — ED Provider Notes (Signed)
[facility] EMERGENCY DEPT Provider Note   CSN: [id number] Arrival date & time: 11/21/20  1145     History Chief Complaint  Patient presents with   Fall   Arm Injury   Hand Injury    KATHARINE ROCHEFORT is a 87 y.o. female.  Pt is a 87 yo female presenting to ED with her daughter for fall that occurred yesterday morning at 1AM. Patient admits to falling on her right sided. Facial wound present. Denies headache, blood thinner use, loc, motor/sensory deficits. Admits to severe pain in right forearm and wrist.   The history is provided by the patient. No language interpreter was used.  Fall Pertinent negatives include no chest pain, no abdominal pain and no shortness of breath.  Arm Injury Associated symptoms: no back pain and no fever   Hand Injury Associated symptoms: no back pain and no fever       Past Medical History:  Diagnosis Date   Arthritis    IN RIGHT HIP AND FINGERS AND BACK   Hypercalcemia    Hyperlipemia    Hypertension    Sleep apnea    STATES SHE HAS CPAP - BUT HAS NOT USED IN THE LAST 2 YRS    Patient Active Problem List   Diagnosis Date Noted   Expected blood loss anemia 08/24/2012   Obesity (BMI 30-39.9) 08/24/2012   S/P right THA, AA 08/23/2012    Past Surgical History:  Procedure Laterality Date   ABDOMINAL HYSTERECTOMY  1978   HEMORRHOID SURGERY  1960'S   TOTAL HIP ARTHROPLASTY Right 08/23/2012   Procedure: RIGHT TOTAL HIP ARTHROPLASTY ANTERIOR APPROACH;  Surgeon: [name], MD;  Location: [facility];  Service: Orthopedics;  Laterality: Right;     OB History   No obstetric history on file.     No family history on file.  Social History   Tobacco Use   Smoking status: Never   Smokeless tobacco: Never  Vaping Use   Vaping Use: Never used  Substance Use Topics   Alcohol use: No   Drug use: No    Home Medications Prior to Admission medications   Medication Sig Start Date End Date Taking? Authorizing Provider   acetaminophen (TYLENOL) 500 MG tablet Take 1,000 mg by mouth every 6 (six) hours as needed for moderate pain.     [provider]  ALPRAZolam Prudy Feeler) 0.5 MG tablet Take 0.25 mg by mouth at bedtime as needed for sleep.     [provider]  amLODipine (NORVASC) 10 MG tablet Take 10 mg by mouth daily as needed (takes occassionally).    [provider]  Ascorbic Acid (VITA-C PO) Take 1 tablet by mouth daily as needed (cold).    [provider]  docusate sodium 100 MG CAPS Take 100 mg by mouth 2 (two) times daily. Patient not taking: Reported on 12/31/2015 08/24/12   Lanney Gins, PA-C  ferrous sulfate 325 (65 FE) MG tablet Take 1 tablet (325 mg total) by mouth 3 (three) times daily after meals. 08/24/12   Lanney Gins, PA-C  hydrochlorothiazide (MICROZIDE) 12.5 MG capsule Take 12.5 mg by mouth daily as needed (for BP).    [provider]  HYDROcodone-acetaminophen (NORCO) 7.5-325 MG per tablet Take 1-2 tablets by mouth every 4 (four) hours as needed for pain. Patient not taking: Reported on 12/31/2015 08/24/12   Lanney Gins, PA-C  lisinopril (PRINIVIL,ZESTRIL) 20 MG tablet Take 20 mg by mouth daily.    [provider]  ondansetron (ZOFRAN) 8 MG tablet Take 1 tablet (8 mg total) by mouth every 8 (eight) hours as needed for nausea. 12/31/15   Doug Sou, MD  polyethylene glycol (MIRALAX / GLYCOLAX) packet Take 17 g by mouth 2 (two) times daily. Patient not taking: Reported on 12/31/2015 08/24/12   Lanney Gins, PA-C  tiZANidine (ZANAFLEX) 4 MG capsule Take 1 capsule (4 mg total) by mouth 3 (three) times daily as needed for muscle spasms. Muscle spasms Patient not taking: Reported on 12/31/2015 08/24/12   Lanney Gins, PA-C    Allergies    Penicillins and Shellfish allergy  Review of Systems   Review of Systems  Constitutional:  Negative for chills and fever.  HENT:  Negative for ear pain and sore throat.   Eyes:  Negative for  pain and visual disturbance.  Respiratory:  Negative for cough and shortness of breath.   Cardiovascular:  Negative for chest pain and palpitations.  Gastrointestinal:  Negative for abdominal pain and vomiting.  Genitourinary:  Negative for dysuria and hematuria.  Musculoskeletal:  Negative for arthralgias and back pain.       Right wrist pain  Skin:  Positive for wound. Negative for color change and rash.  Neurological:  Negative for seizures and syncope.  All other systems reviewed and are negative.  Physical Exam Updated Vital Signs BP (!) 192/115 (BP Location: Left Arm)   Pulse 80   Temp 98.7 F (37.1 C) (Oral)   Resp 17   Ht 5\' 1"  (1.549 m)   Wt 66.2 kg   SpO2 99%   BMI 27.59 kg/m   Physical Exam Vitals and nursing note reviewed.  Constitutional:      General: She is not in acute distress.    Appearance: She is well-developed.  HENT:     Head: Normocephalic.  Eyes:     General: Lids are normal. Vision grossly intact.     Conjunctiva/sclera: Conjunctivae normal.     Pupils: Pupils are equal, round, and reactive to light.  Cardiovascular:     Rate and Rhythm: Normal rate and regular rhythm.     Heart sounds: No murmur heard. Pulmonary:     Effort: Pulmonary effort is normal. No respiratory distress.     Breath sounds: Normal breath sounds.  Abdominal:     Palpations: Abdomen is soft.     Tenderness: There is no abdominal tenderness.  Musculoskeletal:     Right forearm: Swelling and bony tenderness present.     Right wrist: Swelling and bony tenderness present. Normal pulse.     Cervical back: Neck supple.  Skin:    General: Skin is warm and dry.  Neurological:     Mental Status: She is alert.     GCS: GCS eye subscore is 4. GCS verbal subscore is 5. GCS motor subscore is 6.     Cranial Nerves: Cranial nerves are intact.     Sensory: Sensation is intact.     Motor: Motor function is intact.     Coordination: Coordination is intact.     Gait: Gait is intact.     ED Results / Procedures / Treatments   Labs (all labs ordered are listed, but only abnormal results are displayed) Labs Reviewed - No data to display  EKG None  Radiology DG Forearm Right  Result Date: 11/21/2020 CLINICAL DATA:  11/23/2020.  Right forearm pain. EXAM: RIGHT FOREARM - 2 VIEW COMPARISON:  None FINDINGS: Moderate degenerative changes are noted at the wrist with changes  of chondrocalcinosis. The elbow joint is maintained. Mild calcific tendinopathy involving the common extensor tendon. The radius and ulna are intact. No forearm fractures. IMPRESSION: 1. No acute bony findings. 2. Degenerative changes at the wrist. Electronically Signed   By: Rudie Meyer M.D.   On: 11/21/2020 12:52   CT HEAD WO CONTRAST ( )  Result Date: 11/21/2020 CLINICAL DATA:  Fall EXAM: CT HEAD WITHOUT CONTRAST TECHNIQUE: Contiguous axial images were obtained from the base of the skull through the vertex without intravenous contrast. COMPARISON:  None. FINDINGS: Brain: There are hypodense subdural collections overlying the bilateral cerebral convexities measuring up to 0.6 cm on the right and 0.7 cm on the left. No acute blood products are seen within these collections. The collections exerts no significant mass effect on the underlying brain parenchyma. There is no midline shift. Otherwise, there is no evidence of acute intracranial hemorrhage. There is no evidence of acute infarct. There is mild parenchymal volume loss. The ventricles are not enlarged. There is mild chronic white matter microangiopathy. There is no mass lesion. Vascular: No hyperdense vessel or unexpected calcification. Skull: Normal. Negative for fracture or focal lesion. Sinuses/Orbits: The imaged paranasal sinuses are clear. The globes are intact. Bilateral lens implants are in place. The orbits are unremarkable. Other: None. IMPRESSION: 1. No acute intracranial hemorrhage or calvarial fracture. 2. Bilateral hypodense subdural collections as  above consistent with subdural hygroma/chronic subdural hematomas. Electronically Signed   By: Lesia Hausen M.D.   On: 11/21/2020 12:57   DG Hand Complete Right  Result Date: 11/21/2020 CLINICAL DATA:  Fall EXAM: RIGHT HAND - COMPLETE 3+ VIEW COMPARISON:  None. FINDINGS: There is no acute fracture or dislocation. Bony alignment is within normal limits. There is significant chondrocalcinosis about the wrist joint. There is moderate joint space narrowing involving the index, middle, and ring finger PIP joints and more mild joint space narrowing involving the remainder of the joints of the hand. There is associated subchondral sclerosis and osteophytosis. IMPRESSION: 1. No acute fracture or dislocation. 2. Significant chondrocalcinosis about the wrist joint. 3. Degenerative changes primarily affecting the index, middle, and ring finger PIP joints. Electronically Signed   By: Lesia Hausen M.D.   On: 11/21/2020 12:52    Procedures Procedures   Medications Ordered in ED Medications  fentaNYL (SUBLIMAZE) injection 25 mcg (25 mcg Intravenous Given 11/21/20 1213)    ED Course  I have reviewed the triage vital signs and the nursing notes.  Pertinent labs & imaging results that were available during my care of the patient were reviewed by me and considered in my medical decision making (see chart for details).    MDM Rules/Calculators/A&P                          1:06 PM Pt is a 87 yo female presenting for right wrist/forearm pain after fall with blunt head trauma that occurred yesterday at 1AM. Pt is Aox3, acute distress due to wrist pain, with stable vitals. On physical exam patient has swelling and tenderness to palpation of right forearm and wrist. No neurovascular deficits.   Stable CT head and neck: 1. No acute intracranial hemorrhage or calvarial fracture. 2. Bilateral hypodense subdural collections as above consistent with subdural hygroma/chronic subdural hematomas.  Xray  demonstrates: 1. No acute bony findings. 2. Degenerative changes at the wrist.  Ace wrap applied. Medication sent to pharmacy for pain management.   I spoke with neurosurgery on consult who agrees  the subdural is chronic in nature. Recommends follow up in their office in 2 weeks.   Patient in no distress and overall condition improved here in the ED. Detailed discussions were had with the patient regarding current findings, and need for close f/u with PCP or on call doctor. The patient has been instructed to return immediately if the symptoms worsen in any way for re-evaluation. Patient verbalized understanding and is in agreement with current care plan. All questions answered prior to discharge.       Final Clinical Impression(s) / ED Diagnoses Final diagnoses:  Fall, initial encounter  Facial injury, initial encounter  Right wrist pain    Rx / DC Orders ED Discharge Orders     None        Franne Forts, DO 11/21/20 1532

## 2020-12-11 DIAGNOSIS — R413 Other amnesia: Secondary | ICD-10-CM | POA: Insufficient documentation

## 2021-06-11 ENCOUNTER — Encounter (HOSPITAL_COMMUNITY): Payer: Self-pay

## 2021-06-11 ENCOUNTER — Emergency Department (HOSPITAL_COMMUNITY)
Admission: EM | Admit: 2021-06-11 | Discharge: 2021-06-11 | Disposition: A | Discharge status: Home / Self Care | Payer: Medicare Other | Attending: Emergency Medicine | Admitting: Emergency Medicine

## 2021-06-11 ENCOUNTER — Emergency Department (HOSPITAL_COMMUNITY): Payer: Medicare Other

## 2021-06-11 ENCOUNTER — Other Ambulatory Visit: Payer: Self-pay

## 2021-06-11 DIAGNOSIS — N390 Urinary tract infection, site not specified: Secondary | ICD-10-CM | POA: Diagnosis not present

## 2021-06-11 DIAGNOSIS — R300 Dysuria: Secondary | ICD-10-CM | POA: Diagnosis present

## 2021-06-11 DIAGNOSIS — D72829 Elevated white blood cell count, unspecified: Secondary | ICD-10-CM | POA: Insufficient documentation

## 2021-06-11 DIAGNOSIS — I10 Essential (primary) hypertension: Secondary | ICD-10-CM | POA: Insufficient documentation

## 2021-06-11 DIAGNOSIS — R109 Unspecified abdominal pain: Secondary | ICD-10-CM | POA: Diagnosis not present

## 2021-06-11 DIAGNOSIS — R112 Nausea with vomiting, unspecified: Secondary | ICD-10-CM | POA: Insufficient documentation

## 2021-06-11 DIAGNOSIS — Z79899 Other long term (current) drug therapy: Secondary | ICD-10-CM | POA: Diagnosis not present

## 2021-06-11 DIAGNOSIS — R030 Elevated blood-pressure reading, without diagnosis of hypertension: Secondary | ICD-10-CM

## 2021-06-11 LAB — URINALYSIS, ROUTINE W REFLEX MICROSCOPIC
Bilirubin Urine: NEGATIVE
Glucose, UA: NEGATIVE mg/dL
Ketones, ur: NEGATIVE mg/dL
Nitrite: NEGATIVE
Protein, ur: NEGATIVE mg/dL
Specific Gravity, Urine: 1.002 — ABNORMAL LOW (ref 1.005–1.030)
pH: 6 (ref 5.0–8.0)

## 2021-06-11 LAB — BASIC METABOLIC PANEL
Anion gap: 7 (ref 5–15)
BUN: 15 mg/dL (ref 8–23)
CO2: 25 mmol/L (ref 22–32)
Calcium: 9.9 mg/dL (ref 8.9–10.3)
Chloride: 105 mmol/L (ref 98–111)
Creatinine, Ser: 1.12 mg/dL — ABNORMAL HIGH (ref 0.44–1.00)
GFR, Estimated: 47 mL/min — ABNORMAL LOW (ref >60)
Glucose, Bld: 155 mg/dL — ABNORMAL HIGH (ref 70–99)
Potassium: 3.6 mmol/L (ref 3.5–5.1)
Sodium: 137 mmol/L (ref 135–145)

## 2021-06-11 LAB — CBC
HCT: 51.5 % — ABNORMAL HIGH (ref 36.0–46.0)
Hemoglobin: 16.5 g/dL — ABNORMAL HIGH (ref 12.0–15.0)
MCH: 29.8 pg (ref 26.0–34.0)
MCHC: 32 g/dL (ref 30.0–36.0)
MCV: 93 fL (ref 80.0–100.0)
Platelets: 188 10*3/uL (ref 150–400)
RBC: 5.54 MIL/uL — ABNORMAL HIGH (ref 3.87–5.11)
RDW: 12.4 % (ref 11.5–15.5)
WBC: 10.6 10*3/uL — ABNORMAL HIGH (ref 4.0–10.5)
nRBC: 0 % (ref 0.0–0.2)

## 2021-06-11 MED ORDER — HYDROCHLOROTHIAZIDE 12.5 MG PO CAPS
12.5000 mg | ORAL_CAPSULE | Freq: Once | ORAL | Status: DC
  Filled 2021-06-11: qty 1

## 2021-06-11 MED ORDER — LISINOPRIL 10 MG PO TABS
20.0000 mg | ORAL_TABLET | Freq: Once | ORAL | Status: AC
  Administered 2021-06-11: 20 mg via ORAL
  Filled 2021-06-11: qty 2

## 2021-06-11 MED ORDER — HYDRALAZINE HCL 20 MG/ML IJ SOLN
10.0000 mg | Freq: Once | INTRAMUSCULAR | Status: AC
  Administered 2021-06-11: 10 mg via INTRAVENOUS
  Filled 2021-06-11: qty 1

## 2021-06-11 MED ORDER — ONDANSETRON HCL 4 MG PO TABS: 4.0000 mg | ORAL_TABLET | Freq: Four times a day (QID) | ORAL | 0 refills | Status: DC | PRN

## 2021-06-11 MED ORDER — CEFPODOXIME PROXETIL 200 MG PO TABS: 200.0000 mg | ORAL_TABLET | Freq: Every day | ORAL | 0 refills | Status: AC

## 2021-06-11 MED ORDER — SODIUM CHLORIDE 0.9 % IV BOLUS
500.0000 mL | Freq: Once | INTRAVENOUS | Status: AC
  Administered 2021-06-11: 500 mL via INTRAVENOUS

## 2021-06-11 MED ORDER — SODIUM CHLORIDE 0.9 % IV SOLN
1.0000 g | Freq: Once | INTRAVENOUS | Status: AC
  Administered 2021-06-11: 1 g via INTRAVENOUS
  Filled 2021-06-11: qty 10

## 2021-06-11 MED ORDER — ONDANSETRON HCL 4 MG/2ML IJ SOLN
4.0000 mg | Freq: Once | INTRAMUSCULAR | Status: AC
  Administered 2021-06-11: 4 mg via INTRAVENOUS
  Filled 2021-06-11: qty 2

## 2021-06-11 MED ORDER — CEFPODOXIME PROXETIL 200 MG PO TABS: 200.0000 mg | ORAL_TABLET | Freq: Every day | ORAL | 0 refills | Status: DC

## 2021-06-11 MED ORDER — AMLODIPINE BESYLATE 5 MG PO TABS
10.0000 mg | ORAL_TABLET | Freq: Once | ORAL | Status: AC
  Administered 2021-06-11: 10 mg via ORAL
  Filled 2021-06-11: qty 2

## 2021-06-11 MED ORDER — HYDROCHLOROTHIAZIDE 12.5 MG PO TABS
12.5000 mg | ORAL_TABLET | Freq: Once | ORAL | Status: AC
  Administered 2021-06-11: 12.5 mg via ORAL
  Filled 2021-06-11: qty 1

## 2021-06-11 NOTE — ED Triage Notes (Signed)
Pt brought in by her daughter. Pt sent over by PCP for high bp (240/1200 in the office and UTI symptoms.  ? ?C/o burning with urination and blood in urine since yesterday.  ? ?A/O ?Ambulatory in triage.  ?

## 2021-06-11 NOTE — ED Notes (Signed)
Patient denies pain and is resting comfortably.  

## 2021-06-11 NOTE — ED Notes (Signed)
Patient transported to CT 

## 2021-06-11 NOTE — ED Provider Triage Note (Signed)
Emergency Medicine Provider Triage Evaluation Note ? ?Amber Bolton , a 88 y.o. female  was evaluated in triage.  Pt complains of dysuria and urinary frequency that began this morning.  Patient went to her PCPs office and was noted to have an elevated blood pressure and advised to come to the ED for further evaluation.  On arrival blood pressure initially 106/93 however with recheck 208/121 and repeat 212/113.  This appears consistent with readings at PCPs office.  Patient reports compliance with her blood pressure medication however did miss her afternoon dose of hydralazine today.  States yesterday her blood pressure was normal. ? ?Review of Systems  ?Positive: + dysuria, urinary frequency, high blood pressure ?Negative: - chest pain, SOB, abd pain, nausea, vomiting, fevers ? ?Physical Exam  ?BP (!) 212/113   Pulse 93   Temp 99.6 ?F (37.6 ?C) (Oral)   Resp 18   SpO2 97%  ?Gen:   Awake, no distress   ?Resp:  Normal effort  ?MSK:   Moves extremities without difficulty  ?Other:   ? ?Medical Decision Making  ?Medically screening exam initiated at 3:51 PM.  Appropriate orders placed.  Amber Bolton was informed that the remainder of the evaluation will be completed by another provider, this initial triage assessment does not replace that evaluation, and the importance of remaining in the ED until their evaluation is complete. ? ? ?  ?Tanda Rockers, PA-C ?06/11/21 1552 ? ?

## 2021-06-11 NOTE — ED Notes (Signed)
Patient returned back from CT at this time.  °

## 2021-06-11 NOTE — ED Provider Notes (Signed)
?[facility]-EMERGENCY DEPT ?Provider Note ? ? ?CSN: [id number] ?Arrival date & time: 06/11/21  1534 ? ?  ? ?History ? ?Chief Complaint  ?Patient presents with  ? Dysuria  ? ? ?Amber Bolton is a 88 y.o. female. ? ? Patient as above with significant medical history as below, including htm, hld, sleep apnea who presents to the ED with complaint of elevated bp, uti.  ? ?Patient was seen at PCP office earlier today secondary to UTI.  She is having dysuria and frequency over the past 2 days.  History of recurrent UTI.  While at PCPs office she was noted to have blood pressure systolic greater than 200.  She did not take her afternoon blood pressure medications, was sent to the ER for evaluation. ? ?She has no chest pain, dyspnea, diaphoresis, headaches and lightheadedness, vision or hearing changes, no neck pain.  No palpitations. ? ? ?Past Medical History: ?No date: Arthritis ?    Comment:  IN RIGHT HIP AND FINGERS AND BACK ?No date: Hypercalcemia ?No date: Hyperlipemia ?No date: Hypertension ?No date: Sleep apnea ?    Comment:  STATES SHE HAS CPAP - BUT HAS NOT USED IN THE LAST 2 YRS ? ?Past Surgical History: ?1978: ABDOMINAL HYSTERECTOMY ?1960'S: HEMORRHOID SURGERY ?08/23/2012: TOTAL HIP ARTHROPLASTY; Right ?    Comment:  Procedure: RIGHT TOTAL HIP ARTHROPLASTY ANTERIOR  ?             APPROACH;  Surgeon: Shelda PalMatthew D Olin, MD;  Location: [facility]  ?             ORS;  Service: Orthopedics;  Laterality: Right;  ? ? ?The history is provided by the patient and a relative. No language interpreter was used.  ?Dysuria ?Associated symptoms: no abdominal pain, no fever, no nausea and no vomiting   ? ?  ? ?Home Medications ?Prior to Admission medications   ?Medication Sig Start Date End Date Taking? Authorizing Provider  ?traMADol (ULTRAM) 50 MG tablet Take 50 mg by mouth daily as needed for moderate pain.   Yes [provider]  ?acetaminophen (TYLENOL) 325 MG tablet Take 2 tablets (650 mg total) by mouth  every 6 (six) hours as needed. 11/21/20   Franne FortsGray, Alicia P, DO  ?ALPRAZolam (XANAX) 0.5 MG tablet Take 0.25 mg by mouth at bedtime as needed for sleep.     [provider]  ?amLODipine (NORVASC) 10 MG tablet Take 10 mg by mouth daily as needed (takes occassionally).    [provider]  ?cefpodoxime (VANTIN) 200 MG tablet Take 1 tablet (200 mg total) by mouth daily for 14 days. 06/12/21 06/26/21  Tanda RockersGray, Railynn Ballo A, DO  ?docusate sodium 100 MG CAPS Take 100 mg by mouth 2 (two) times daily. ?Patient not taking: Reported on 12/31/2015 08/24/12   Lanney GinsBabish, Matthew, PA-C  ?ferrous sulfate 325 (65 FE) MG tablet Take 1 tablet (325 mg total) by mouth 3 (three) times daily after meals. ?Patient not taking: Reported on 11/21/2020 08/24/12   Lanney GinsBabish, Matthew, PA-C  ?hydrochlorothiazide (MICROZIDE) 12.5 MG capsule Take 12.5 mg by mouth daily as needed (for BP).    [provider]  ?HYDROcodone-acetaminophen (NORCO) 7.5-325 MG per tablet Take 1-2 tablets by mouth every 4 (four) hours as needed for pain. ?Patient not taking: Reported on 12/31/2015 08/24/12   Lanney GinsBabish, Matthew, PA-C  ?lisinopril (PRINIVIL,ZESTRIL) 20 MG tablet Take 20 mg by mouth daily.    [provider]  ?ondansetron (ZOFRAN) 4 MG tablet Take 1 tablet (  4 mg total) by mouth every 6 (six) hours as needed for nausea or vomiting. 06/11/21   Tanda Rockers A, DO  ?ondansetron (ZOFRAN) 8 MG tablet Take 1 tablet (8 mg total) by mouth every 8 (eight) hours as needed for nausea. ?Patient not taking: Reported on 11/21/2020 12/31/15   Doug Sou, MD  ?polyethylene glycol (MIRALAX / GLYCOLAX) packet Take 17 g by mouth 2 (two) times daily. ?Patient not taking: Reported on 12/31/2015 08/24/12   Lanney Gins, PA-C  ?   ? ?Allergies    ?Penicillins and Shellfish allergy   ? ?Review of Systems   ?Review of Systems  ?Constitutional:  Negative for chills and fever.  ?HENT:  Negative for facial swelling and trouble swallowing.   ?Eyes:  Negative for photophobia  and visual disturbance.  ?Respiratory:  Negative for cough and shortness of breath.   ?Cardiovascular:  Negative for chest pain and palpitations.  ?Gastrointestinal:  Negative for abdominal pain, nausea and vomiting.  ?Endocrine: Negative for polydipsia and polyuria.  ?Genitourinary:  Positive for dysuria, frequency and hematuria. Negative for difficulty urinating.  ?Musculoskeletal:  Negative for gait problem and joint swelling.  ?Skin:  Negative for pallor and rash.  ?Neurological:  Negative for syncope and headaches.  ?Psychiatric/Behavioral:  Negative for agitation and confusion.   ? ?Physical Exam ?Updated Vital Signs ?BP (!) 164/73   Pulse 87   Temp 98.8 ?F (37.1 ?C) (Oral)   Resp 10   Ht 5\' 1"  (1.549 m)   Wt 56.2 kg   SpO2 95%   BMI 23.43 kg/m?  ?Physical Exam ?Vitals and nursing note reviewed.  ?Constitutional:   ?   General: She is not in acute distress. ?   Appearance: Normal appearance.  ?HENT:  ?   Head: Normocephalic and atraumatic.  ?   Right Ear: External ear normal.  ?   Left Ear: External ear normal.  ?   Nose: Nose normal.  ?   Mouth/Throat:  ?   Mouth: Mucous membranes are moist.  ?Eyes:  ?   General: No scleral icterus.    ?   Right eye: No discharge.     ?   Left eye: No discharge.  ?Cardiovascular:  ?   Rate and Rhythm: Normal rate and regular rhythm.  ?   Pulses: Normal pulses.  ?   Heart sounds: Normal heart sounds.  ?Pulmonary:  ?   Effort: Pulmonary effort is normal. No respiratory distress.  ?   Breath sounds: Normal breath sounds.  ?Abdominal:  ?   General: Abdomen is flat. There is no distension.  ?   Palpations: Abdomen is soft.  ?   Tenderness: There is no abdominal tenderness. There is no guarding or rebound. Negative signs include Murphy's sign.  ?Musculoskeletal:     ?   General: Normal range of motion.  ?   Cervical back: Normal range of motion.  ?   Right lower leg: No edema.  ?   Left lower leg: No edema.  ?Skin: ?   General: Skin is warm and dry.  ?   Capillary Refill:  Capillary refill takes less than 2 seconds.  ?Neurological:  ?   Mental Status: She is alert and oriented to person, place, and time.  ?   GCS: GCS eye subscore is 4. GCS verbal subscore is 5. GCS motor subscore is 6.  ?Psychiatric:     ?   Mood and Affect: Mood normal.     ?   Behavior:  Behavior normal.  ? ? ?ED Results / Procedures / Treatments   ?Labs ?(all labs ordered are listed, but only abnormal results are displayed) ?Labs Reviewed  ?BASIC METABOLIC PANEL - Abnormal; Notable for the following components:  ?    Result Value  ? Glucose, Bld 155 (*)   ? Creatinine, Ser 1.12 (*)   ? GFR, Estimated 47 (*)   ? All other components within normal limits  ?CBC - Abnormal; Notable for the following components:  ? WBC 10.6 (*)   ? RBC 5.54 (*)   ? Hemoglobin 16.5 (*)   ? HCT 51.5 (*)   ? All other components within normal limits  ?URINALYSIS, ROUTINE W REFLEX MICROSCOPIC - Abnormal; Notable for the following components:  ? Color, Urine STRAW (*)   ? Specific Gravity, Urine 1.002 (*)   ? Hgb urine dipstick LARGE (*)   ? Leukocytes,Ua LARGE (*)   ? Bacteria, UA RARE (*)   ? All other components within normal limits  ?URINE CULTURE  ? ? ?EKG ?EKG Interpretation ? ?Date/Time:  Wednesday June 11 2021 16:58:33 EDT ?Ventricular Rate:  87 ?PR Interval:  160 ?QRS Duration: 88 ?QT Interval:  355 ?QTC Calculation: 427 ?R Axis:   -49 ?Text Interpretation: Sinus rhythm Atrial premature complex Left atrial enlargement Left anterior fascicular block Confirmed by Tanda Rockers ([id number]) on 06/11/2021 7:05:15 PM ? ?Radiology ?CT Renal Stone Study ? ?Result Date: 06/11/2021 ?CLINICAL DATA:  Flank pain, kidney stone suspected Dysuria and urinary frequency. EXAM: CT ABDOMEN AND PELVIS WITHOUT CONTRAST TECHNIQUE: Multidetector CT imaging of the abdomen and pelvis was performed following the standard protocol without IV contrast. RADIATION DOSE REDUCTION: This exam was performed according to the departmental dose-optimization program which  includes automated exposure control, adjustment of the mA and/or kV according to patient size and/or use of iterative reconstruction technique. COMPARISON:  None. FINDINGS: Lower chest: Mild cardiomegaly. No basilar

## 2021-06-11 NOTE — Discharge Instructions (Signed)
Please follow-up with your PCP regarding elevated blood pressure ? ?It was a pleasure caring for you today in the emergency department. ? ?Please return to the emergency department for any worsening or worrisome symptoms. ? ?

## 2021-06-13 LAB — URINE CULTURE: Culture: 40000 — AB

## 2021-06-14 ENCOUNTER — Telehealth (HOSPITAL_BASED_OUTPATIENT_CLINIC_OR_DEPARTMENT_OTHER): Payer: Self-pay | Admitting: *Deleted

## 2021-06-14 NOTE — Telephone Encounter (Signed)
Post ED Visit - Positive Culture Follow-up ? ?Culture report reviewed by antimicrobial stewardship pharmacist: ?[facility] ?[]  , Pharm.D. ?[]  Enzo Bi, .D., BCPS AQ-ID ?[]  Celedonio Miyamoto, Pharm.D., BCPS ?[]  [address], Pharm.D., BCPS ?[]  [place], Garvin Fila.D., BCPS, AAHIVP ?[]  , Pharm.D., BCPS, AAHIVP ?[]  Georgina Pillion, PharmD, BCPS ?[]  , PharmD, BCPS ?[]  [place], PharmD, BCPS ?[]  [address], PharmD ?[]  , PharmD, BCPS ?[]  Estella Husk, PharmD ? ? [facility] ?[]  Lysle Pearl, PharmD ?[]  , PharmD ?[]  Phillips Climes, PharmD ?[]  , Rph ?[]  Agapito Games) , PharmD ?[]  Verlan Friends, PharmD ?[]  , PharmD ?[]  Mervyn Gay, PharmD ?[]  , PharmD ?[]  Vinnie Level, PharmD ?[]  Gerri Spore, PharmD ?[x]  , PharmD ?[]  Len Childs, PharmD ? ? ?Positive urine culture ?Treated with Cefpodoxime Proxetil , organism sensitive to the same and no further patient follow-up is required at this time. ? ? ?06/14/2021, 9:31 AM ?  ?

## 2021-08-23 ENCOUNTER — Emergency Department (HOSPITAL_COMMUNITY)
Admission: EM | Admit: 2021-08-23 | Discharge: 2021-08-24 | Disposition: A | Discharge status: Home / Self Care | Payer: Medicare Other | Attending: Emergency Medicine | Admitting: Emergency Medicine

## 2021-08-23 ENCOUNTER — Other Ambulatory Visit: Payer: Self-pay

## 2021-08-23 ENCOUNTER — Encounter (HOSPITAL_COMMUNITY): Payer: Self-pay | Admitting: Emergency Medicine

## 2021-08-23 DIAGNOSIS — R110 Nausea: Secondary | ICD-10-CM | POA: Insufficient documentation

## 2021-08-23 DIAGNOSIS — R1030 Lower abdominal pain, unspecified: Secondary | ICD-10-CM | POA: Insufficient documentation

## 2021-08-23 DIAGNOSIS — I10 Essential (primary) hypertension: Secondary | ICD-10-CM | POA: Insufficient documentation

## 2021-08-23 DIAGNOSIS — E876 Hypokalemia: Secondary | ICD-10-CM | POA: Insufficient documentation

## 2021-08-23 LAB — COMPREHENSIVE METABOLIC PANEL
ALT: 16 U/L (ref 0–44)
AST: 18 U/L (ref 15–41)
Albumin: 3.4 g/dL — ABNORMAL LOW (ref 3.5–5.0)
Alkaline Phosphatase: 63 U/L (ref 38–126)
Anion gap: 12 (ref 5–15)
BUN: 12 mg/dL (ref 8–23)
CO2: 25 mmol/L (ref 22–32)
Calcium: 9.8 mg/dL (ref 8.9–10.3)
Chloride: 102 mmol/L (ref 98–111)
Creatinine, Ser: 0.99 mg/dL (ref 0.44–1.00)
GFR, Estimated: 53 mL/min — ABNORMAL LOW (ref >60)
Glucose, Bld: 187 mg/dL — ABNORMAL HIGH (ref 70–99)
Potassium: 3.3 mmol/L — ABNORMAL LOW (ref 3.5–5.1)
Sodium: 139 mmol/L (ref 135–145)
Total Bilirubin: 0.8 mg/dL (ref 0.3–1.2)
Total Protein: 6.3 g/dL — ABNORMAL LOW (ref 6.5–8.1)

## 2021-08-23 LAB — CBC
HCT: 49 % — ABNORMAL HIGH (ref 36.0–46.0)
Hemoglobin: 15.7 g/dL — ABNORMAL HIGH (ref 12.0–15.0)
MCH: 29.7 pg (ref 26.0–34.0)
MCHC: 32 g/dL (ref 30.0–36.0)
MCV: 92.8 fL (ref 80.0–100.0)
Platelets: 171 10*3/uL (ref 150–400)
RBC: 5.28 MIL/uL — ABNORMAL HIGH (ref 3.87–5.11)
RDW: 12.2 % (ref 11.5–15.5)
WBC: 5.1 10*3/uL (ref 4.0–10.5)
nRBC: 0 % (ref 0.0–0.2)

## 2021-08-23 LAB — LIPASE, BLOOD: Lipase: 35 U/L (ref 11–51)

## 2021-08-23 NOTE — ED Triage Notes (Signed)
Per [facility], c/o feeling poorly, LLQ abdominal pain (some tenderness) nausea and hypertension (218/120).  She states she sometimes has abdominal pain which goes away when she eats however it has not gone away now.    While there has been some confusion, family states that they seem to notice this more over the past few months. Pt is alert and oriented now and very independent. She was ambulatory having walked to the bathroom and stretcher for EMS.   HR 96 - irregular - hx of AFIB

## 2021-08-24 ENCOUNTER — Emergency Department (HOSPITAL_COMMUNITY): Payer: Medicare Other

## 2021-08-24 LAB — URINALYSIS, ROUTINE W REFLEX MICROSCOPIC
Bacteria, UA: NONE SEEN
Bilirubin Urine: NEGATIVE
Glucose, UA: 50 mg/dL — AB
Hgb urine dipstick: NEGATIVE
Ketones, ur: NEGATIVE mg/dL
Leukocytes,Ua: NEGATIVE
Nitrite: NEGATIVE
Protein, ur: NEGATIVE mg/dL
Specific Gravity, Urine: 1.005 (ref 1.005–1.030)
pH: 8 (ref 5.0–8.0)

## 2021-08-24 LAB — TROPONIN I (HIGH SENSITIVITY)
Troponin I (High Sensitivity): 15 ng/L (ref <18)
Troponin I (High Sensitivity): 14 ng/L (ref <18)

## 2021-08-24 MED ORDER — SUCRALFATE 1 G PO TABS: 1.0000 g | ORAL_TABLET | Freq: Three times a day (TID) | ORAL | 0 refills | Status: DC

## 2021-08-24 MED ORDER — IOHEXOL 300 MG/ML  SOLN
80.0000 mL | Freq: Once | INTRAMUSCULAR | Status: AC | PRN
  Administered 2021-08-24: 80 mL via INTRAVENOUS

## 2021-08-24 NOTE — ED Notes (Signed)
Patient resting quietly.  No complaints voiced at this time ?

## 2021-12-28 ENCOUNTER — Emergency Department (HOSPITAL_COMMUNITY)
Admission: EM | Admit: 2021-12-28 | Discharge: 2021-12-28 | Disposition: A | Discharge status: Home / Self Care | Payer: Medicare Other | Attending: Emergency Medicine | Admitting: Emergency Medicine

## 2021-12-28 ENCOUNTER — Emergency Department (HOSPITAL_COMMUNITY): Payer: Medicare Other

## 2021-12-28 DIAGNOSIS — Z79899 Other long term (current) drug therapy: Secondary | ICD-10-CM | POA: Insufficient documentation

## 2021-12-28 DIAGNOSIS — R110 Nausea: Secondary | ICD-10-CM

## 2021-12-28 DIAGNOSIS — I160 Hypertensive urgency: Secondary | ICD-10-CM | POA: Diagnosis not present

## 2021-12-28 DIAGNOSIS — I10 Essential (primary) hypertension: Secondary | ICD-10-CM | POA: Insufficient documentation

## 2021-12-28 LAB — URINALYSIS, ROUTINE W REFLEX MICROSCOPIC
Bilirubin Urine: NEGATIVE
Glucose, UA: NEGATIVE mg/dL
Hgb urine dipstick: NEGATIVE
Ketones, ur: NEGATIVE mg/dL
Leukocytes,Ua: NEGATIVE
Nitrite: NEGATIVE
Protein, ur: NEGATIVE mg/dL
Specific Gravity, Urine: 1.008 (ref 1.005–1.030)
pH: 7 (ref 5.0–8.0)

## 2021-12-28 LAB — CBC WITH DIFFERENTIAL/PLATELET
Abs Immature Granulocytes: 0.01 10*3/uL (ref 0.00–0.07)
Basophils Absolute: 0 10*3/uL (ref 0.0–0.1)
Basophils Relative: 1 %
Eosinophils Absolute: 0.2 10*3/uL (ref 0.0–0.5)
Eosinophils Relative: 4 %
HCT: 46.9 % — ABNORMAL HIGH (ref 36.0–46.0)
Hemoglobin: 14.9 g/dL (ref 12.0–15.0)
Immature Granulocytes: 0 %
Lymphocytes Relative: 30 %
Lymphs Abs: 1.5 10*3/uL (ref 0.7–4.0)
MCH: 30.1 pg (ref 26.0–34.0)
MCHC: 31.8 g/dL (ref 30.0–36.0)
MCV: 94.7 fL (ref 80.0–100.0)
Monocytes Absolute: 0.5 10*3/uL (ref 0.1–1.0)
Monocytes Relative: 10 %
Neutro Abs: 2.9 10*3/uL (ref 1.7–7.7)
Neutrophils Relative %: 55 %
Platelets: 156 10*3/uL (ref 150–400)
RBC: 4.95 MIL/uL (ref 3.87–5.11)
RDW: 12.2 % (ref 11.5–15.5)
WBC: 5.2 10*3/uL (ref 4.0–10.5)
nRBC: 0 % (ref 0.0–0.2)

## 2021-12-28 LAB — COMPREHENSIVE METABOLIC PANEL
ALT: 11 U/L (ref 0–44)
AST: 18 U/L (ref 15–41)
Albumin: 3.1 g/dL — ABNORMAL LOW (ref 3.5–5.0)
Alkaline Phosphatase: 59 U/L (ref 38–126)
Anion gap: 10 (ref 5–15)
BUN: 16 mg/dL (ref 8–23)
CO2: 23 mmol/L (ref 22–32)
Calcium: 9.7 mg/dL (ref 8.9–10.3)
Chloride: 106 mmol/L (ref 98–111)
Creatinine, Ser: 1.13 mg/dL — ABNORMAL HIGH (ref 0.44–1.00)
GFR, Estimated: 46 mL/min — ABNORMAL LOW (ref >60)
Glucose, Bld: 122 mg/dL — ABNORMAL HIGH (ref 70–99)
Potassium: 3.7 mmol/L (ref 3.5–5.1)
Sodium: 139 mmol/L (ref 135–145)
Total Bilirubin: 0.2 mg/dL — ABNORMAL LOW (ref 0.3–1.2)
Total Protein: 5.7 g/dL — ABNORMAL LOW (ref 6.5–8.1)

## 2021-12-28 LAB — LIPASE, BLOOD: Lipase: 32 U/L (ref 11–51)

## 2021-12-28 LAB — TROPONIN I (HIGH SENSITIVITY): Troponin I (High Sensitivity): 13 ng/L (ref <18)

## 2021-12-28 MED ORDER — AMLODIPINE BESYLATE 5 MG PO TABS
10.0000 mg | ORAL_TABLET | Freq: Once | ORAL | Status: AC
  Administered 2021-12-28: 10 mg via ORAL
  Filled 2021-12-28: qty 2

## 2021-12-28 MED ORDER — HYDROCHLOROTHIAZIDE 25 MG PO TABS: 25.0000 mg | ORAL_TABLET | Freq: Every day | ORAL | 0 refills | Status: DC

## 2021-12-28 MED ORDER — ONDANSETRON HCL 4 MG PO TABS: 4.0000 mg | ORAL_TABLET | Freq: Three times a day (TID) | ORAL | 0 refills | Status: DC | PRN

## 2021-12-28 MED ORDER — LISINOPRIL 20 MG PO TABS
20.0000 mg | ORAL_TABLET | Freq: Once | ORAL | Status: AC
  Administered 2021-12-28: 20 mg via ORAL
  Filled 2021-12-28: qty 1

## 2021-12-28 NOTE — ED Triage Notes (Signed)
Pt here via [facility] from home for weakness x 2 days.  Warm to touch. Hypertensive in the 200's. EKG shows PVC'S, PAC's, LBB and some LVH.  Pt ao x 4.  218/124 manual bp Hr 68 irregular 97% Cbg 141 with no oral intake  20 L FA

## 2021-12-28 NOTE — Discharge Instructions (Addendum)
You were seen today in the emergency department due to nausea.  You were found to have uncontrolled hypertension at this time.  I have prescribed a new medication called HCTZ.  Please take as prescribed.  Please contact the number listed above to discuss your uncontrolled hypertension and a follow-up appointment.  Please call and schedule an appointment as soon as possible.  If you develop any life-threatening condition such as chest pain or shortness of breath please return to the emergency department

## 2021-12-28 NOTE — ED Provider Notes (Signed)
[facility] EMERGENCY DEPARTMENT Provider Note   CSN: [id number] Arrival date & time: 12/28/21  1814     History  Chief Complaint  Patient presents with   Nausea    Amber Bolton is a 88 y.o. female.  Patient presents to the hospital via EMS complaining of nausea.  EMS reports patient is here for weakness but patient states that she has been nauseated for the past month or longer and that she has been evaluated previously with no known cause.  She currently denies weakness, chest pain, shortness of breath, abdominal pain, vomiting, constipation, diarrhea, urinary symptoms.  Patient also found to be hypertensive upon EMS arrival with blood pressure upon arrival 201/103.  Past medical history significant for hypertension, hyperlipidemia, arthritis, hypercalcemia  HPI     Home Medications Prior to Admission medications   Medication Sig Start Date End Date Taking? Authorizing Provider  hydrochlorothiazide (HYDRODIURIL) 25 MG tablet Take 1 tablet (25 mg total) by mouth daily. 12/28/21  Yes Darrick Grinder, PA-C  acetaminophen (TYLENOL) 325 MG tablet Take 2 tablets (650 mg total) by mouth every 6 (six) hours as needed. 11/21/20   Franne Forts, DO  ALPRAZolam Prudy Feeler) 0.5 MG tablet Take 0.25 mg by mouth at bedtime as needed for sleep.     [provider]  amLODipine (NORVASC) 10 MG tablet Take 10 mg by mouth daily.    [provider]  docusate sodium 100 MG CAPS Take 100 mg by mouth 2 (two) times daily. Patient not taking: Reported on 12/31/2015 08/24/12   Lanney Gins, PA-C  ferrous sulfate 325 (65 FE) MG tablet Take 1 tablet (325 mg total) by mouth 3 (three) times daily after meals. Patient not taking: Reported on 11/21/2020 08/24/12   Lanney Gins, PA-C  HYDROcodone-acetaminophen [facility]) 7.5-325 MG per tablet Take 1-2 tablets by mouth every 4 (four) hours as needed for pain. Patient not taking: Reported on 12/31/2015 08/24/12   Lanney Gins,  PA-C  lisinopril (PRINIVIL,ZESTRIL) 20 MG tablet Take 20 mg by mouth daily.    [provider]  ondansetron (ZOFRAN) 4 MG tablet Take 1 tablet (4 mg total) by mouth every 6 (six) hours as needed for nausea or vomiting. Patient not taking: Reported on 08/24/2021 06/11/21   Tanda Rockers A, DO  ondansetron (ZOFRAN) 8 MG tablet Take 1 tablet (8 mg total) by mouth every 8 (eight) hours as needed for nausea. Patient not taking: Reported on 11/21/2020 12/31/15   Doug Sou, MD  polyethylene glycol [facility] Ethelene Hal) packet Take 17 g by mouth 2 (two) times daily. Patient not taking: Reported on 12/31/2015 08/24/12   Lanney Gins, PA-C  sucralfate (CARAFATE) 1 g tablet Take 1 tablet (1 g total) by mouth 4 (four) times daily -  with meals and at bedtime. 08/24/21   Roxy Horseman, PA-C  traMADol (ULTRAM) 50 MG tablet Take 50 mg by mouth daily as needed for moderate pain.    [provider]      Allergies    Penicillins and Shellfish allergy    Review of Systems   Review of Systems  Constitutional:  Negative for fever.  Respiratory:  Negative for shortness of breath.   Cardiovascular:  Negative for chest pain, palpitations and leg swelling.  Gastrointestinal:  Positive for nausea. Negative for abdominal pain, constipation, diarrhea and vomiting.  Genitourinary:  Negative for dysuria.    Physical Exam Updated Vital Signs BP (!) 201/103 (BP Location: Right Arm)   Temp 99.2  F (37.3 C) (Oral)   Resp 12   SpO2 98%  Physical Exam Vitals and nursing note reviewed.  Constitutional:      General: She is not in acute distress.    Appearance: She is well-developed.  HENT:     Head: Normocephalic and atraumatic.     Mouth/Throat:     Mouth: Mucous membranes are moist.  Eyes:     Conjunctiva/sclera: Conjunctivae normal.  Cardiovascular:     Rate and Rhythm: Normal rate. Rhythm irregular.     Heart sounds: No murmur heard. Pulmonary:     Effort: Pulmonary effort is  normal. No respiratory distress.     Breath sounds: Normal breath sounds.  Abdominal:     Palpations: Abdomen is soft.     Tenderness: There is no abdominal tenderness.  Musculoskeletal:        General: No swelling.     Cervical back: Neck supple.     Right lower leg: No edema.     Left lower leg: No edema.  Skin:    General: Skin is warm and dry.     Capillary Refill: Capillary refill takes less than 2 seconds.  Neurological:     Mental Status: She is alert.  Psychiatric:        Mood and Affect: Mood normal.     ED Results / Procedures / Treatments   Labs (all labs ordered are listed, but only abnormal results are displayed) Labs Reviewed  CBC WITH DIFFERENTIAL/PLATELET - Abnormal; Notable for the following components:      Result Value   HCT 46.9 (*)    All other components within normal limits  COMPREHENSIVE METABOLIC PANEL - Abnormal; Notable for the following components:   Glucose, Bld 122 (*)    Creatinine, Ser 1.13 (*)    Total Protein 5.7 (*)    Albumin 3.1 (*)    Total Bilirubin 0.2 (*)    GFR, Estimated 46 (*)    All other components within normal limits  URINALYSIS, ROUTINE W REFLEX MICROSCOPIC - Abnormal; Notable for the following components:   Color, Urine STRAW (*)    All other components within normal limits  LIPASE, BLOOD  TROPONIN I (HIGH SENSITIVITY)    EKG None  Radiology No results found.  Procedures Procedures    Medications Ordered in ED Medications  amLODipine (NORVASC) tablet 10 mg (10 mg Oral Given 12/28/21 2116)  lisinopril (ZESTRIL) tablet 20 mg (20 mg Oral Given 12/28/21 2116)    ED Course/ Medical Decision Making/ A&P                           Medical Decision Making Amount and/or Complexity of Data Reviewed Labs: ordered. Radiology: ordered.  Risk Prescription drug management.   This patient presents to the ED for concern of nausea, this involves an extensive number of treatment options, and is a complaint that  carries with it a high risk of complications and morbidity.  The differential diagnosis includes ACS, hypertensive urgency, and many others   Co morbidities that complicate the patient evaluation  History of hypertension   Additional history obtained:  Additional history obtained from daughter at bedside and EMS External records from outside source obtained and reviewed including outpatient notes showing primary care evaluation of hypertension with recent abnormally high readings   Lab Tests:  I Ordered, and personally interpreted labs.  The pertinent results include: Creatinine 1.13 (consistent with baseline), grossly unremarkable CBC, lipase  32, troponin 13   Imaging Studies ordered:  I ordered imaging studies including chest x-ray  I independently visualized and interpreted imaging which showed no active disease I agree with the radiologist interpretation   Cardiac Monitoring: / EKG:  The patient was maintained on a cardiac monitor.  I personally viewed and interpreted the cardiac monitored which showed an underlying rhythm of: Sinus rhythm with left bundle branch block    Problem List / ED Course / Critical interventions / Medication management   I ordered medication including Norvasc and lisinopril for hypertension Reevaluation of the patient after these medicines showed that the patient improved I have reviewed the patients home medicines and have made adjustments as needed   Social Determinants of Health:  Patient lives at home with her son   Test / Admission - Considered:  The patient began to feel better after an additional dose of her blood pressure medications.  Question of the patient may have had a hypertensive urgency causing some of her nausea.  Patient's blood pressure is obviously not well controlled at this time.  She reports readings that are similarly is high at home over the past few weeks at times.  Plan to discharge patient home on new HCTZ  prescription.  Troponin and EKG do not suggest ACS at this time.  No pneumonia on chest x-ray.  No abdominal pain, diarrhea, to suggest obvious GI etiology.  Symptoms been ongoing for upwards of 3 or 4 months.  I see no indication at this time for admission.  Plan to discharge patient home with outpatient follow-up for hypertension       Final Clinical Impression(s) / ED Diagnoses Final diagnoses:  Uncontrolled hypertension  Nausea  Hypertensive urgency    Rx / DC Orders ED Discharge Orders          Ordered    hydrochlorothiazide (HYDRODIURIL) 25 MG tablet  Daily        12/28/21 2254              Pamala Duffel 12/28/21 2254    Derwood Kaplan, MD 01/06/22 2031

## 2022-01-29 ENCOUNTER — Ambulatory Visit (HOSPITAL_BASED_OUTPATIENT_CLINIC_OR_DEPARTMENT_OTHER): Payer: Medicare Other | Admitting: Family

## 2022-03-09 ENCOUNTER — Encounter (HOSPITAL_BASED_OUTPATIENT_CLINIC_OR_DEPARTMENT_OTHER): Payer: Self-pay | Admitting: Cardiovascular Disease

## 2022-03-09 ENCOUNTER — Ambulatory Visit (HOSPITAL_BASED_OUTPATIENT_CLINIC_OR_DEPARTMENT_OTHER): Payer: Medicare Other | Admitting: Cardiovascular Disease

## 2022-03-09 VITALS — BP 152/84 | HR 95 | Ht 60.0 in | Wt 133.2 lb

## 2022-03-09 DIAGNOSIS — I998 Other disorder of circulatory system: Secondary | ICD-10-CM | POA: Diagnosis not present

## 2022-03-09 DIAGNOSIS — I1A0 Resistant hypertension: Secondary | ICD-10-CM | POA: Diagnosis not present

## 2022-03-09 MED ORDER — HYDROCHLOROTHIAZIDE 12.5 MG PO TABS: 12.5000 mg | ORAL_TABLET | Freq: Every day | ORAL | 3 refills | Status: DC

## 2022-03-09 NOTE — Progress Notes (Signed)
Cardiology Office Note:    Date:  03/24/2022   ID:  Amber Bolton, DOB 1929/03/08, MRN [id number]  PCP:  [facility]   [facility] Providers Cardiologist:  None     Referring MD: [facility]   CC: hypertension  History of Present Illness:    Amber Bolton is a 89 y.o. female with a hx of hypertension, hyperlipidemia, arthritis, and hypercalcemia here to establish care in [facility]. She was seen by Delrae Rend on 06/19/2021 for follow up of her essential hypertension.  She presented to the ED on 08/23/21 for nausea and lower abdominal pain. She reported being hypertensive and that her pain is persistent despite previously improving upon eating. She was started on Carafate 1 g tablet.  She presented to the ED on 12/28/2021 for uncontrolled hypertension. She complained of nausea and her BP was elevated at 201/103. She was started on HCTZ 25 mg in addition to the lisinopril and amlodipine that were previously prescribed. CTA of the abdomen showed moderate to severe pancolonic diverticulosis without diverticulitis she had mild hepatic state doses. She has mild hepatic steatosis.  She also was noted to have aortic atherosclerosis. There were no adrenal abnormalities.  Today, she is accompanied by her daughter. She is doing okay overall. She has been struggling with her BP. She has had HTN for the last 3 years. However, her BP also fluctuated following her hip replacement in 2014. At home, her BP fluctuates. She reports that her BP machine has recently run out of batteries. She reports having "normal" values last week, including 155-65/80s. She feels okay during these readings. She noted that each arm yields different readings on average. She denies tingling/numbness in arms. Her BP in clinic today was 135/74. Upon recheck, her BP was 174/92 in her right arm. Her left arm BP reading upon recheck was 152/84. She had one fall last year  and fell on her hand. Her BP was high and she endorsed pain in her hand. She does not cook regularly. She is on Me[employer] on Wheels and consumes frozen dinners. The meals are not salty. She occasionally drinks coffee in the mornings. She drinks wine occasionally, such as when feeling nauseous or tired. She does not get too much exercise besides walking in the house. For her hip pain she takes tramadol. She sometimes forgets to take her medication on occasion, but she usually keeps on with it daily. She discontinued the HCTZ because she felt that her BP was too low and felt very sluggish. On one occasion her BP was 117/50s. She regularly snores softly. She breathes regularly at night. She has some swelling on top of her feet that began last week. She endorses some stress due to family circumstances. She denies any palpitations, chest pain, shortness of breath. No lightheadedness, headaches, syncope, orthopnea, or PND.   Past Medical History:  Diagnosis Date   Arthritis    IN RIGHT HIP AND FINGERS AND BACK   Hypercalcemia    Hyperlipemia    Resistant hypertension 03/09/2022   Sleep apnea    STATES SHE HAS CPAP - BUT HAS NOT USED IN THE LAST 2 YRS    Past Surgical History:  Procedure Laterality Date   ABDOMINAL HYSTERECTOMY  1978   HEMORRHOID SURGERY  1960'S   TOTAL HIP ARTHROPLASTY Right 08/23/2012   Procedure: RIGHT TOTAL HIP ARTHROPLASTY ANTERIOR APPROACH;  Surgeon: [name], MD;  Location: [facility];  Service: Orthopedics;  Laterality:  Right;    Current Medications: Current Meds  Medication Sig   acetaminophen (TYLENOL) 325 MG tablet Take 2 tablets (650 mg total) by mouth every 6 (six) hours as needed.   ALPRAZolam (XANAX) 0.5 MG tablet Take 0.25 mg by mouth at bedtime as needed for sleep.    amLODipine (NORVASC) 10 MG tablet Take 10 mg by mouth daily.   docusate sodium 100 MG CAPS Take 100 mg by mouth 2 (two) times daily.   hydrochlorothiazide (HYDRODIURIL) 12.5 MG tablet Take 1 tablet  (12.5 mg total) by mouth daily.   lisinopril (PRINIVIL,ZESTRIL) 20 MG tablet Take 20 mg by mouth daily.   ondansetron (ZOFRAN) 4 MG tablet Take 1 tablet (4 mg total) by mouth every 8 (eight) hours as needed for nausea or vomiting.   traMADol (ULTRAM) 50 MG tablet Take 50 mg by mouth daily as needed for moderate pain.     Allergies:   Penicillins and Shellfish allergy   Social History   Socioeconomic History   Marital status: Married    Spouse name: Not on file   Number of children: Not on file   Years of education: Not on file   Highest education level: Not on file  Occupational History   Not on file  Tobacco Use   Smoking status: Never   Smokeless tobacco: Never  Vaping Use   Vaping Use: Never used  Substance and Sexual Activity   Alcohol use: No   Drug use: No   Sexual activity: Not on file  Other Topics Concern   Not on file  Social History Narrative   Not on file   Social Determinants of Health   Financial Resource Strain: Not on file  Food Insecurity: Not on file  Transportation Needs: Not on file  Physical Activity: Not on file  Stress: Not on file  Social Connections: Not on file     Family History: The patient's family history is not on file.  ROS:   Please see the history of present illness.    (+) nausea (+) fall (+) LE edema (+) stress (+) sluggish (+) soft snore  All other systems reviewed and are negative.  EKGs/Labs/Other Studies Reviewed:    The following studies were reviewed today: No prior cardiovascular studies available.   EKG:  EKG is personally reviewed. 03/09/22: EKG was not ordered. 08/24/21: 62 bpm, Right and left arm electrode reversal, interpretation assumes no reversal Sinus rhythm Premature ventricular complexes Probable left atrial enlargement Anterolateral infarct, age indeterminate When compared with ECG of 08/23/2021, No significant change was found   Recent Labs: 12/28/2021: ALT 11; BUN 16; Creatinine, Ser 1.13;  Hemoglobin 14.9; Platelets 156; Potassium 3.7; Sodium 139   Recent Lipid Panel No results found for: "CHOL", "TRIG", "HDL", "CHOLHDL", "VLDL", "LDLCALC", "LDLDIRECT"   Risk Assessment/Calculations:     Physical Exam:    VS:  BP (!) 152/84 (BP Location: Left Arm, Patient Position: Sitting, Cuff Size: Normal)   Pulse 95   Ht 5' (1.524 m)   Wt 133 lb 3.2 oz (60.4 kg)   SpO2 97%   BMI 26.01 kg/m     Wt Readings from Last 3 Encounters:  03/09/22 133 lb 3.2 oz (60.4 kg)  08/23/21 123 lb 14.4 oz (56.2 kg)  06/11/21 124 lb (56.2 kg)     VS:  BP (!) 152/84 (BP Location: Left Arm, Patient Position: Sitting, Cuff Size: Normal)   Pulse 95   Ht 5' (1.524 m)   Wt 133 lb 3.2  oz (60.4 kg)   SpO2 97%   BMI 26.01 kg/m  , BMI Body mass index is 26.01 kg/m. GENERAL:  Well appearing HEENT: Pupils equal round and reactive, fundi not visualized, oral mucosa unremarkable NECK:  No jugular venous distention, waveform within normal limits, carotid upstroke brisk and symmetric, no bruits, no thyromegaly LUNGS:  Clear to auscultation bilaterally HEART:  RRR.  PMI not displaced or sustained,S1 and S2 within normal limits, no S3, no S4, no clicks, no rubs, no murmurs ABD:  Flat, positive bowel sounds normal in frequency in pitch, no bruits, no rebound, no guarding, no midline pulsatile mass, no hepatomegaly, no splenomegaly EXT:  2 plus pulses throughout, no edema, no cyanosis no clubbing SKIN:  No rashes no nodules NEURO:  Cranial nerves II through XII grossly intact, motor grossly intact throughout PSYCH:  Cognitively intact, oriented to person place and time   ASSESSMENT:    1. Resistant hypertension   2. Asymmetric blood pressures    PLAN:    Resistant hypertension Blood pressure is very labile.  It has been up and down since at least 2014.  She has been under a lot of stresss from her family situation.  Her daughter is living there and she feels stressed by them being there.  She stopped  HCTZ when her BP got too low.  It is now averaging in the 150s.  Recommend resuming HCTZ at 12.5mg .  Continue amlodipine and lisinopril.  We will check TSH, renin and aldosterone, catecholamines, and metanephrines.  Check renal artery Dopplers.  Her blood pressures are asymmetric.  We will check carotid Dopplers.   Disposition: FU with Ozias Dicenzo C. Duke Salvia, MD, [facility] in 1 month  Medication Adjustments/Labs and Tests Ordered: Current medicines are reviewed at length with the patient today.  Concerns regarding medicines are outlined above.  Orders Placed This Encounter  Procedures   TSH   Aldosterone + renin activity w/ ratio   Metanephrines, plasma   Catecholamines, fractionated, plasma   VAS US CAROTID   VAS US RENAL ARTERY DUPLEX   Meds ordered this encounter  Medications   hydrochlorothiazide (HYDRODIURIL) 12.5 MG tablet    Sig: Take 1 tablet (12.5 mg total) by mouth daily.    Dispense:  45 tablet    Refill:  3    D/C 25 MG RX   Patient Instructions  Medication Instructions:  DECREASE YOUR HYDROCHLOROTHIAZIDE TO 12.5 MG DAILY    Labwork: RENIN/ALDOSTERONE/CATACHOLAMINES/METANEPHRINES/TSH SOON, GO FIRST THING IN THE MORNING    Testing/Procedures: Your physician has requested that you have a renal artery duplex. During this test, an ultrasound is used to evaluate blood flow to the kidneys. Allow one hour for this exam. Do not eat after midnight the day before and avoid carbonated beverages. Take your medications as you usually do.  Your physician has requested that you have a carotid duplex. This test is an ultrasound of the carotid arteries in your neck. It looks at blood flow through these arteries that supply the brain with blood. Allow one hour for this exam. There are no restrictions or special instructions.   Follow-Up: 04/07/2022 3:30 PM WITH PHARM D    Special Instructions:  MONITOR YOUR BLOOD PRESSURE TWICE A DAY, LOG IN THE BOOK PROVIDED. BRING THE BOOK AND YOUR BLOOD  PRESSURE MACHINE TO YOUR FOLLOW UP IN 1 MONTH   DASH Eating Plan DASH stands for "Dietary Approaches to Stop Hypertension." The DASH eating plan is a healthy eating plan that has been shown  to reduce high blood pressure (hypertension). It may also reduce your risk for type 2 diabetes, heart disease, and stroke. The DASH eating plan may also help with weight loss. What are tips for following this plan?  General guidelines Avoid eating more than 2,300 mg (milligrams) of salt (sodium) a day. If you have hypertension, you may need to reduce your sodium intake to 1,500 mg a day. Limit alcohol intake to no more than 1 drink a day for nonpregnant women and 2 drinks a day for men. One drink equals 12 oz of beer, 5 oz of wine, or 1 oz of hard liquor. Work with your health care provider to maintain a healthy body weight or to lose weight. Ask what an ideal weight is for you. Get at least 30 minutes of exercise that causes your heart to beat faster (aerobic exercise) most days of the week. Activities may include walking, swimming, or biking. Work with your health care provider or diet and nutrition specialist (dietitian) to adjust your eating plan to your individual calorie needs. Reading food labels  Check food labels for the amount of sodium per serving. Choose foods with less than 5 percent of the Daily Value of sodium. Generally, foods with less than 300 mg of sodium per serving fit into this eating plan. To find whole grains, look for the word "whole" as the first word in the ingredient list. Shopping Buy products labeled as "low-sodium" or "no salt added." Buy fresh foods. Avoid canned foods and premade or frozen meals. Cooking Avoid adding salt when cooking. Use salt-free seasonings or herbs instead of table salt or sea salt. Check with your health care provider or pharmacist before using salt substitutes. Do not fry foods. Cook foods using healthy methods such as baking, boiling, grilling, and  broiling instead. Cook with heart-healthy oils, such as olive, canola, soybean, or sunflower oil. Meal planning Eat a balanced diet that includes: 5 or more servings of fruits and vegetables each day. At each meal, try to fill half of your plate with fruits and vegetables. Up to 6-8 servings of whole grains each day. Less than 6 oz of lean meat, poultry, or fish each day. A 3-oz serving of meat is about the same size as a deck of cards. One egg equals 1 oz. 2 servings of low-fat dairy each day. A serving of nuts, seeds, or beans 5 times each week. Heart-healthy fats. Healthy fats called Omega-3 fatty acids are found in foods such as flaxseeds and coldwater fish, like sardines, salmon, and mackerel. Limit how much you eat of the following: Canned or prepackaged foods. Food that is high in trans fat, such as fried foods. Food that is high in saturated fat, such as fatty meat. Sweets, desserts, sugary drinks, and other foods with added sugar. Full-fat dairy products. Do not salt foods before eating. Try to eat at least 2 vegetarian meals each week. Eat more home-cooked food and less restaurant, buffet, and fast food. When eating at a restaurant, ask that your food be prepared with less salt or no salt, if possible. What foods are recommended? The items listed may not be a complete list. Talk with your dietitian about what dietary choices are best for you. Grains Whole-grain or whole-wheat bread. Whole-grain or whole-wheat pasta. Brown rice. Modena Morrow. Bulgur. Whole-grain and low-sodium cereals. Pita bread. Low-fat, low-sodium crackers. Whole-wheat flour tortillas. Vegetables Fresh or frozen vegetables (raw, steamed, roasted, or grilled). Low-sodium or reduced-sodium tomato and vegetable juice. Low-sodium or reduced-sodium  tomato sauce and tomato paste. Low-sodium or reduced-sodium canned vegetables. Fruits All fresh, dried, or frozen fruit. Canned fruit in natural juice (without added  sugar). Meat and other protein foods Skinless chicken or [place]. Ground chicken or [place]. Pork with fat trimmed off. Fish and seafood. Egg whites. Dried beans, peas, or lentils. Unsalted nuts, nut butters, and seeds. Unsalted canned beans. Lean cuts of beef with fat trimmed off. Low-sodium, lean deli meat. Dairy Low-fat (1%) or fat-free (skim) milk. Fat-free, low-fat, or reduced-fat cheeses. Nonfat, low-sodium ricotta or cottage cheese. Low-fat or nonfat yogurt. Low-fat, low-sodium cheese. Fats and oils Soft margarine without trans fats. Vegetable oil. Low-fat, reduced-fat, or light mayonnaise and salad dressings (reduced-sodium). Canola, safflower, olive, soybean, and sunflower oils. Avocado. Seasoning and other foods Herbs. Spices. Seasoning mixes without salt. Unsalted popcorn and pretzels. Fat-free sweets. What foods are not recommended? The items listed may not be a complete list. Talk with your dietitian about what dietary choices are best for you. Grains Baked goods made with fat, such as croissants, muffins, or some breads. Dry pasta or rice meal packs. Vegetables Creamed or fried vegetables. Vegetables in a cheese sauce. Regular canned vegetables (not low-sodium or reduced-sodium). Regular canned tomato sauce and paste (not low-sodium or reduced-sodium). Regular tomato and vegetable juice (not low-sodium or reduced-sodium). Rosita Fire. Olives. Fruits Canned fruit in a light or heavy syrup. Fried fruit. Fruit in cream or butter sauce. Meat and other protein foods Fatty cuts of meat. Ribs. Fried meat. Tomasa Blase. Sausage. Bologna and other processed lunch meats. Salami. Fatback. Hotdogs. Bratwurst. Salted nuts and seeds. Canned beans with added salt. Canned or smoked fish. Whole eggs or egg yolks. Chicken or [place] with skin. Dairy Whole or 2% milk, cream, and half-and-half. Whole or full-fat cream cheese. Whole-fat or sweetened yogurt. Full-fat cheese. Nondairy creamers. Whipped toppings.  Processed cheese and cheese spreads. Fats and oils Butter. Stick margarine. Lard. Shortening. Ghee. Bacon fat. Tropical oils, such as coconut, palm kernel, or palm oil. Seasoning and other foods Salted popcorn and pretzels. Onion salt, garlic salt, seasoned salt, table salt, and sea salt. Worcestershire sauce. Tartar sauce. Barbecue sauce. Teriyaki sauce. Soy sauce, including reduced-sodium. Steak sauce. Canned and packaged gravies. Fish sauce. Oyster sauce. Cocktail sauce. Horseradish that you find on the shelf. Ketchup. Mustard. Meat flavorings and tenderizers. Bouillon cubes. Hot sauce and Tabasco sauce. Premade or packaged marinades. Premade or packaged taco seasonings. Relishes. Regular salad dressings. Where to find more information: National Heart, Lung, and Blood Institute: PopSteam.is American Heart Association: www.heart.org Summary The DASH eating plan is a healthy eating plan that has been shown to reduce high blood pressure (hypertension). It may also reduce your risk for type 2 diabetes, heart disease, and stroke. With the DASH eating plan, you should limit salt (sodium) intake to 2,300 mg a day. If you have hypertension, you may need to reduce your sodium intake to 1,500 mg a day. When on the DASH eating plan, aim to eat more fresh fruits and vegetables, whole grains, lean proteins, low-fat dairy, and heart-healthy fats. Work with your health care provider or diet and nutrition specialist (dietitian) to adjust your eating plan to your individual calorie needs. This information is not intended to replace advice given to you by your health care provider. Make sure you discuss any questions you have with your health care provider. Document Released: 02/05/2011 Document Revised: 01/29/2017 Document Reviewed: 02/10/2016 Elsevier Patient Education  2020 [employer].    I,Mitra Faeizi,acting as a Neurosurgeon for [employer],  MD.,have documented all relevant documentation on the  behalf of Chilton Si, MD,as directed by  Chilton Si, MD while in the presence of Chilton Si, MD.  I, Jazsmin Couse C. Duke Salvia, MD have reviewed all documentation for this visit.  The documentation of the exam, diagnosis, procedures, and orders on 03/24/2022 are all accurate and complete.   Signed, Chilton Si, MD  03/24/2022 3:41 PM    [facility]

## 2022-03-09 NOTE — Patient Instructions (Signed)
Medication Instructions:  DECREASE YOUR HYDROCHLOROTHIAZIDE TO 12.5 MG DAILY    Labwork: RENIN/ALDOSTERONE/CATACHOLAMINES/METANEPHRINES/TSH SOON, GO FIRST THING IN THE MORNING    Testing/Procedures: Your physician has requested that you have a renal artery duplex. During this test, an ultrasound is used to evaluate blood flow to the kidneys. Allow one hour for this exam. Do not eat after midnight the day before and avoid carbonated beverages. Take your medications as you usually do.  Your physician has requested that you have a carotid duplex. This test is an ultrasound of the carotid arteries in your neck. It looks at blood flow through these arteries that supply the brain with blood. Allow one hour for this exam. There are no restrictions or special instructions.   Follow-Up: 04/07/2022 3:30 PM WITH PHARM D    Special Instructions:  MONITOR YOUR BLOOD PRESSURE TWICE A DAY, LOG IN THE BOOK PROVIDED. BRING THE BOOK AND YOUR BLOOD PRESSURE MACHINE TO YOUR FOLLOW UP IN 1 MONTH   DASH Eating Plan DASH stands for "Dietary Approaches to Stop Hypertension." The DASH eating plan is a healthy eating plan that has been shown to reduce high blood pressure (hypertension). It may also reduce your risk for type 2 diabetes, heart disease, and stroke. The DASH eating plan may also help with weight loss. What are tips for following this plan?  General guidelines Avoid eating more than 2,300 mg (milligrams) of salt (sodium) a day. If you have hypertension, you may need to reduce your sodium intake to 1,500 mg a day. Limit alcohol intake to no more than 1 drink a day for nonpregnant women and 2 drinks a day for men. One drink equals 12 oz of beer, 5 oz of wine, or 1 oz of hard liquor. Work with your health care provider to maintain a healthy body weight or to lose weight. Ask what an ideal weight is for you. Get at least 30 minutes of exercise that causes your heart to beat faster (aerobic exercise) most  days of the week. Activities may include walking, swimming, or biking. Work with your health care provider or diet and nutrition specialist (dietitian) to adjust your eating plan to your individual calorie needs. Reading food labels  Check food labels for the amount of sodium per serving. Choose foods with less than 5 percent of the Daily Value of sodium. Generally, foods with less than 300 mg of sodium per serving fit into this eating plan. To find whole grains, look for the word "whole" as the first word in the ingredient list. Shopping Buy products labeled as "low-sodium" or "no salt added." Buy fresh foods. Avoid canned foods and premade or frozen meals. Cooking Avoid adding salt when cooking. Use salt-free seasonings or herbs instead of table salt or sea salt. Check with your health care provider or pharmacist before using salt substitutes. Do not fry foods. Cook foods using healthy methods such as baking, boiling, grilling, and broiling instead. Cook with heart-healthy oils, such as olive, canola, soybean, or sunflower oil. Meal planning Eat a balanced diet that includes: 5 or more servings of fruits and vegetables each day. At each meal, try to fill half of your plate with fruits and vegetables. Up to 6-8 servings of whole grains each day. Less than 6 oz of lean meat, poultry, or fish each day. A 3-oz serving of meat is about the same size as a deck of cards. One egg equals 1 oz. 2 servings of low-fat dairy each day. A serving of nuts,  seeds, or beans 5 times each week. Heart-healthy fats. Healthy fats called Omega-3 fatty acids are found in foods such as flaxseeds and coldwater fish, like sardines, salmon, and mackerel. Limit how much you eat of the following: Canned or prepackaged foods. Food that is high in trans fat, such as fried foods. Food that is high in saturated fat, such as fatty meat. Sweets, desserts, sugary drinks, and other foods with added sugar. Full-fat dairy  products. Do not salt foods before eating. Try to eat at least 2 vegetarian meals each week. Eat more home-cooked food and less restaurant, buffet, and fast food. When eating at a restaurant, ask that your food be prepared with less salt or no salt, if possible. What foods are recommended? The items listed may not be a complete list. Talk with your dietitian about what dietary choices are best for you. Grains Whole-grain or whole-wheat bread. Whole-grain or whole-wheat pasta. Brown rice. Orpah Cobb. Bulgur. Whole-grain and low-sodium cereals. Pita bread. Low-fat, low-sodium crackers. Whole-wheat flour tortillas. Vegetables Fresh or frozen vegetables (raw, steamed, roasted, or grilled). Low-sodium or reduced-sodium tomato and vegetable juice. Low-sodium or reduced-sodium tomato sauce and tomato paste. Low-sodium or reduced-sodium canned vegetables. Fruits All fresh, dried, or frozen fruit. Canned fruit in natural juice (without added sugar). Meat and other protein foods Skinless chicken or [place]. Ground chicken or [place]. Pork with fat trimmed off. Fish and seafood. Egg whites. Dried beans, peas, or lentils. Unsalted nuts, nut butters, and seeds. Unsalted canned beans. Lean cuts of beef with fat trimmed off. Low-sodium, lean deli meat. Dairy Low-fat (1%) or fat-free (skim) milk. Fat-free, low-fat, or reduced-fat cheeses. Nonfat, low-sodium ricotta or cottage cheese. Low-fat or nonfat yogurt. Low-fat, low-sodium cheese. Fats and oils Soft margarine without trans fats. Vegetable oil. Low-fat, reduced-fat, or light mayonnaise and salad dressings (reduced-sodium). Canola, safflower, olive, soybean, and sunflower oils. Avocado. Seasoning and other foods Herbs. Spices. Seasoning mixes without salt. Unsalted popcorn and pretzels. Fat-free sweets. What foods are not recommended? The items listed may not be a complete list. Talk with your dietitian about what dietary choices are best for  you. Grains Baked goods made with fat, such as croissants, muffins, or some breads. Dry pasta or rice meal packs. Vegetables Creamed or fried vegetables. Vegetables in a cheese sauce. Regular canned vegetables (not low-sodium or reduced-sodium). Regular canned tomato sauce and paste (not low-sodium or reduced-sodium). Regular tomato and vegetable juice (not low-sodium or reduced-sodium). Rosita Fire. Olives. Fruits Canned fruit in a light or heavy syrup. Fried fruit. Fruit in cream or butter sauce. Meat and other protein foods Fatty cuts of meat. Ribs. Fried meat. Tomasa Blase. Sausage. Bologna and other processed lunch meats. Salami. Fatback. Hotdogs. Bratwurst. Salted nuts and seeds. Canned beans with added salt. Canned or smoked fish. Whole eggs or egg yolks. Chicken or [place] with skin. Dairy Whole or 2% milk, cream, and half-and-half. Whole or full-fat cream cheese. Whole-fat or sweetened yogurt. Full-fat cheese. Nondairy creamers. Whipped toppings. Processed cheese and cheese spreads. Fats and oils Butter. Stick margarine. Lard. Shortening. Ghee. Bacon fat. Tropical oils, such as coconut, palm kernel, or palm oil. Seasoning and other foods Salted popcorn and pretzels. Onion salt, garlic salt, seasoned salt, table salt, and sea salt. Worcestershire sauce. Tartar sauce. Barbecue sauce. Teriyaki sauce. Soy sauce, including reduced-sodium. Steak sauce. Canned and packaged gravies. Fish sauce. Oyster sauce. Cocktail sauce. Horseradish that you find on the shelf. Ketchup. Mustard. Meat flavorings and tenderizers. Bouillon cubes. Hot sauce and Tabasco sauce. Premade or packaged  marinades. Premade or packaged taco seasonings. Relishes. Regular salad dressings. Where to find more information: National Heart, Lung, and [place]: https://wilson-eaton.com/ American Heart Association: www.heart.org Summary The DASH eating plan is a healthy eating plan that has been shown to reduce high blood pressure (hypertension).  It may also reduce your risk for type 2 diabetes, heart disease, and stroke. With the DASH eating plan, you should limit salt (sodium) intake to 2,300 mg a day. If you have hypertension, you may need to reduce your sodium intake to 1,500 mg a day. When on the DASH eating plan, aim to eat more fresh fruits and vegetables, whole grains, lean proteins, low-fat dairy, and heart-healthy fats. Work with your health care provider or diet and nutrition specialist (dietitian) to adjust your eating plan to your individual calorie needs. This information is not intended to replace advice given to you by your health care provider. Make sure you discuss any questions you have with your health care provider. Document Released: 02/05/2011 Document Revised: 01/29/2017 Document Reviewed: 02/10/2016 Elsevier Patient Education  2020 [employer].

## 2022-03-09 NOTE — Assessment & Plan Note (Addendum)
Blood pressure is very labile.  It has been up and down since at least 2014.  She has been under a lot of stresss from her family situation.  Her daughter is living there and she feels stressed by them being there.  She stopped HCTZ when her BP got too low.  It is now averaging in the 150s.  Recommend resuming HCTZ at 12.5mg .  Continue amlodipine and lisinopril.  We will check TSH, renin and aldosterone, catecholamines, and metanephrines.  Check renal artery Dopplers.  Her blood pressures are asymmetric.  We will check carotid Dopplers.

## 2022-03-24 ENCOUNTER — Encounter (HOSPITAL_BASED_OUTPATIENT_CLINIC_OR_DEPARTMENT_OTHER): Payer: Self-pay | Admitting: Cardiovascular Disease

## 2022-03-28 LAB — CATECHOLAMINES, FRACTIONATED, PLASMA
Dopamine: 71 pg/mL — ABNORMAL HIGH (ref 0–48)
Epinephrine: 27 pg/mL (ref 0–62)
Norepinephrine: 599 pg/mL (ref 0–874)

## 2022-03-28 LAB — TSH: TSH: 2.44 u[IU]/mL (ref 0.450–4.500)

## 2022-03-28 LAB — METANEPHRINES, PLASMA
Metanephrine, Free: <25 pg/mL (ref 0.0–88.0)
Normetanephrine, Free: 153.4 pg/mL (ref 0.0–297.2)

## 2022-03-28 LAB — ALDOSTERONE + RENIN ACTIVITY W/ RATIO
Aldos/Renin Ratio: 21 (ref 0.0–30.0)
Aldosterone: 8.3 ng/dL (ref 0.0–30.0)
Renin Activity, Plasma: 0.396 ng/mL/hr (ref 0.167–5.380)

## 2022-03-30 ENCOUNTER — Telehealth (HOSPITAL_BASED_OUTPATIENT_CLINIC_OR_DEPARTMENT_OTHER): Payer: Self-pay

## 2022-03-30 NOTE — Telephone Encounter (Addendum)
Left message for patient to call back   ----- Message from Loel Dubonnet, NP sent at 03/29/2022  7:54 PM EST ----- Catecholamines/metanephrines not consistent with pheochromocytoma. Normal thyroid. No evidence of secondary hypertension by labs. Good result!

## 2022-03-31 NOTE — Telephone Encounter (Signed)
2nd call attempt, results relayed with patient's daughter (ok per DPR).      ----- Message from Loel Dubonnet, NP sent at 03/29/2022  7:54 PM EST ----- Catecholamines/metanephrines not consistent with pheochromocytoma. Normal thyroid. No evidence of secondary hypertension by labs. Good result!

## 2022-04-01 ENCOUNTER — Encounter (HOSPITAL_BASED_OUTPATIENT_CLINIC_OR_DEPARTMENT_OTHER): Payer: Medicare Other

## 2022-04-07 ENCOUNTER — Ambulatory Visit (HOSPITAL_BASED_OUTPATIENT_CLINIC_OR_DEPARTMENT_OTHER): Payer: Medicare Other

## 2022-04-17 ENCOUNTER — Encounter (HOSPITAL_BASED_OUTPATIENT_CLINIC_OR_DEPARTMENT_OTHER): Payer: Medicare Other

## 2022-04-21 ENCOUNTER — Encounter (HOSPITAL_BASED_OUTPATIENT_CLINIC_OR_DEPARTMENT_OTHER): Payer: Medicare Other

## 2022-04-21 ENCOUNTER — Telehealth (HOSPITAL_BASED_OUTPATIENT_CLINIC_OR_DEPARTMENT_OTHER): Payer: Self-pay

## 2022-04-21 ENCOUNTER — Ambulatory Visit (INDEPENDENT_AMBULATORY_CARE_PROVIDER_SITE_OTHER): Payer: Medicare Other

## 2022-04-21 ENCOUNTER — Telehealth (HOSPITAL_BASED_OUTPATIENT_CLINIC_OR_DEPARTMENT_OTHER): Payer: Self-pay | Admitting: *Deleted

## 2022-04-21 DIAGNOSIS — I10 Essential (primary) hypertension: Secondary | ICD-10-CM

## 2022-04-21 DIAGNOSIS — I1A0 Resistant hypertension: Secondary | ICD-10-CM | POA: Diagnosis not present

## 2022-04-21 DIAGNOSIS — I998 Other disorder of circulatory system: Secondary | ICD-10-CM

## 2022-04-21 NOTE — Telephone Encounter (Signed)
Patient in for vascular study, blood pressure 182/88 & 202/102 per tech She had not taken her medications but was going home to do so  Tried to call patient to follow up, left message to call back

## 2022-04-21 NOTE — Telephone Encounter (Addendum)
No answer, no DPR on file to leave message, will retry contact   ----- Message from Loel Dubonnet, NP sent at 04/21/2022  4:57 PM EST ----- No carotid stenosis. Good result!

## 2022-04-22 NOTE — Telephone Encounter (Signed)
2nd call attempt, no answer, left message to call back!    ----- Message from Loel Dubonnet, NP sent at 04/21/2022  4:57 PM EST ----- No carotid stenosis. Good result!   Loel Dubonnet, NP  Gerald Stabs, RN No renal artery stenosis. Good result!

## 2022-04-23 ENCOUNTER — Telehealth: Payer: Self-pay | Admitting: Cardiovascular Disease

## 2022-04-23 NOTE — Telephone Encounter (Signed)
Returned call to patient's daughter.  Explained there was no DPR on file so I wouldn't be able to share.  I asked her daughter to be sure to complete a DPR when she comes with her mom next time. Patient's daughter will go to patient's home and call back for results. Georgana Curio MHA RN CCM

## 2022-04-23 NOTE — Telephone Encounter (Signed)
   Pt's daughter is returning call to get result

## 2022-04-23 NOTE — Telephone Encounter (Signed)
Results relayed to patient.   Loel Dubonnet, NP

## 2022-05-04 ENCOUNTER — Ambulatory Visit (HOSPITAL_BASED_OUTPATIENT_CLINIC_OR_DEPARTMENT_OTHER): Payer: Medicare Other

## 2022-05-07 NOTE — Telephone Encounter (Signed)
Patient has since been spoken to in separate encounter.

## 2022-05-07 NOTE — Telephone Encounter (Deleted)
Patient has since been spoken to in separate encounter.

## 2022-05-11 ENCOUNTER — Encounter (HOSPITAL_BASED_OUTPATIENT_CLINIC_OR_DEPARTMENT_OTHER): Payer: Medicare Other

## 2022-06-09 ENCOUNTER — Ambulatory Visit (HOSPITAL_BASED_OUTPATIENT_CLINIC_OR_DEPARTMENT_OTHER): Payer: Medicare Other

## 2022-06-16 ENCOUNTER — Inpatient Hospital Stay (HOSPITAL_COMMUNITY)
Admission: EM | Admit: 2022-06-16 | Discharge: 2022-06-18 | DRG: 065 | Disposition: A | Discharge status: Home Health | Payer: Medicare Other | Attending: Neurology | Admitting: Neurology

## 2022-06-16 ENCOUNTER — Emergency Department (HOSPITAL_COMMUNITY): Payer: Medicare Other

## 2022-06-16 ENCOUNTER — Encounter (HOSPITAL_COMMUNITY): Payer: Self-pay

## 2022-06-16 ENCOUNTER — Other Ambulatory Visit: Payer: Self-pay

## 2022-06-16 DIAGNOSIS — G459 Transient cerebral ischemic attack, unspecified: Secondary | ICD-10-CM | POA: Diagnosis present

## 2022-06-16 DIAGNOSIS — Z880 Allergy status to penicillin: Secondary | ICD-10-CM

## 2022-06-16 DIAGNOSIS — Z91013 Allergy to seafood: Secondary | ICD-10-CM

## 2022-06-16 DIAGNOSIS — G8194 Hemiplegia, unspecified affecting left nondominant side: Secondary | ICD-10-CM | POA: Diagnosis present

## 2022-06-16 DIAGNOSIS — Z8673 Personal history of transient ischemic attack (TIA), and cerebral infarction without residual deficits: Secondary | ICD-10-CM | POA: Diagnosis not present

## 2022-06-16 DIAGNOSIS — I119 Hypertensive heart disease without heart failure: Secondary | ICD-10-CM | POA: Diagnosis present

## 2022-06-16 DIAGNOSIS — E119 Type 2 diabetes mellitus without complications: Secondary | ICD-10-CM | POA: Diagnosis present

## 2022-06-16 DIAGNOSIS — R29704 NIHSS score 4: Secondary | ICD-10-CM | POA: Diagnosis present

## 2022-06-16 DIAGNOSIS — I161 Hypertensive emergency: Secondary | ICD-10-CM | POA: Diagnosis not present

## 2022-06-16 DIAGNOSIS — I6202 Nontraumatic subacute subdural hemorrhage: Principal | ICD-10-CM | POA: Diagnosis present

## 2022-06-16 DIAGNOSIS — M1990 Unspecified osteoarthritis, unspecified site: Secondary | ICD-10-CM | POA: Diagnosis present

## 2022-06-16 DIAGNOSIS — D751 Secondary polycythemia: Secondary | ICD-10-CM | POA: Diagnosis present

## 2022-06-16 DIAGNOSIS — W1830XA Fall on same level, unspecified, initial encounter: Secondary | ICD-10-CM | POA: Diagnosis present

## 2022-06-16 DIAGNOSIS — Z96641 Presence of right artificial hip joint: Secondary | ICD-10-CM | POA: Diagnosis present

## 2022-06-16 DIAGNOSIS — E785 Hyperlipidemia, unspecified: Secondary | ICD-10-CM | POA: Diagnosis present

## 2022-06-16 DIAGNOSIS — Z90710 Acquired absence of both cervix and uterus: Secondary | ICD-10-CM | POA: Diagnosis not present

## 2022-06-16 DIAGNOSIS — R531 Weakness: Principal | ICD-10-CM

## 2022-06-16 DIAGNOSIS — R471 Dysarthria and anarthria: Secondary | ICD-10-CM | POA: Diagnosis present

## 2022-06-16 DIAGNOSIS — R569 Unspecified convulsions: Secondary | ICD-10-CM | POA: Diagnosis present

## 2022-06-16 DIAGNOSIS — G4733 Obstructive sleep apnea (adult) (pediatric): Secondary | ICD-10-CM | POA: Diagnosis present

## 2022-06-16 DIAGNOSIS — S065XAA Traumatic subdural hemorrhage with loss of consciousness status unknown, initial encounter: Secondary | ICD-10-CM | POA: Diagnosis not present

## 2022-06-16 DIAGNOSIS — N179 Acute kidney failure, unspecified: Secondary | ICD-10-CM | POA: Diagnosis present

## 2022-06-16 DIAGNOSIS — I6389 Other cerebral infarction: Secondary | ICD-10-CM | POA: Diagnosis not present

## 2022-06-16 DIAGNOSIS — Z79899 Other long term (current) drug therapy: Secondary | ICD-10-CM | POA: Diagnosis not present

## 2022-06-16 LAB — RAPID URINE DRUG SCREEN, HOSP PERFORMED
Amphetamines: NOT DETECTED
Barbiturates: NOT DETECTED
Benzodiazepines: NOT DETECTED
Cocaine: NOT DETECTED
Opiates: NOT DETECTED
Tetrahydrocannabinol: NOT DETECTED

## 2022-06-16 LAB — COMPREHENSIVE METABOLIC PANEL
ALT: 11 U/L (ref 0–44)
AST: 17 U/L (ref 15–41)
Albumin: 3.5 g/dL (ref 3.5–5.0)
Alkaline Phosphatase: 61 U/L (ref 38–126)
Anion gap: 13 (ref 5–15)
BUN: 17 mg/dL (ref 8–23)
CO2: 22 mmol/L (ref 22–32)
Calcium: 9.5 mg/dL (ref 8.9–10.3)
Chloride: 102 mmol/L (ref 98–111)
Creatinine, Ser: 1.13 mg/dL — ABNORMAL HIGH (ref 0.44–1.00)
GFR, Estimated: 46 mL/min — ABNORMAL LOW (ref >60)
Glucose, Bld: 182 mg/dL — ABNORMAL HIGH (ref 70–99)
Potassium: 3.6 mmol/L (ref 3.5–5.1)
Sodium: 137 mmol/L (ref 135–145)
Total Bilirubin: 1.1 mg/dL (ref 0.3–1.2)
Total Protein: 6.6 g/dL (ref 6.5–8.1)

## 2022-06-16 LAB — I-STAT CHEM 8, ED
BUN: 19 mg/dL (ref 8–23)
Calcium, Ion: 1.05 mmol/L — ABNORMAL LOW (ref 1.15–1.40)
Chloride: 105 mmol/L (ref 98–111)
Creatinine, Ser: 1 mg/dL (ref 0.44–1.00)
Glucose, Bld: 172 mg/dL — ABNORMAL HIGH (ref 70–99)
HCT: 50 % — ABNORMAL HIGH (ref 36.0–46.0)
Hemoglobin: 17 g/dL — ABNORMAL HIGH (ref 12.0–15.0)
Potassium: 3.7 mmol/L (ref 3.5–5.1)
Sodium: 137 mmol/L (ref 135–145)
TCO2: 27 mmol/L (ref 22–32)

## 2022-06-16 LAB — CBC
HCT: 51.8 % — ABNORMAL HIGH (ref 36.0–46.0)
Hemoglobin: 16.2 g/dL — ABNORMAL HIGH (ref 12.0–15.0)
MCH: 29.4 pg (ref 26.0–34.0)
MCHC: 31.3 g/dL (ref 30.0–36.0)
MCV: 94 fL (ref 80.0–100.0)
Platelets: 150 10*3/uL (ref 150–400)
RBC: 5.51 MIL/uL — ABNORMAL HIGH (ref 3.87–5.11)
RDW: 11.9 % (ref 11.5–15.5)
WBC: 4 10*3/uL (ref 4.0–10.5)
nRBC: 0 % (ref 0.0–0.2)

## 2022-06-16 LAB — DIFFERENTIAL
Abs Immature Granulocytes: 0.01 10*3/uL (ref 0.00–0.07)
Basophils Absolute: 0 10*3/uL (ref 0.0–0.1)
Basophils Relative: 1 %
Eosinophils Absolute: 0.1 10*3/uL (ref 0.0–0.5)
Eosinophils Relative: 2 %
Immature Granulocytes: 0 %
Lymphocytes Relative: 51 %
Lymphs Abs: 2.1 10*3/uL (ref 0.7–4.0)
Monocytes Absolute: 0.3 10*3/uL (ref 0.1–1.0)
Monocytes Relative: 8 %
Neutro Abs: 1.5 10*3/uL — ABNORMAL LOW (ref 1.7–7.7)
Neutrophils Relative %: 38 %

## 2022-06-16 LAB — URINALYSIS, ROUTINE W REFLEX MICROSCOPIC
Bilirubin Urine: NEGATIVE
Glucose, UA: NEGATIVE mg/dL
Hgb urine dipstick: NEGATIVE
Ketones, ur: 20 mg/dL — AB
Leukocytes,Ua: NEGATIVE
Nitrite: NEGATIVE
Protein, ur: 100 mg/dL — AB
Specific Gravity, Urine: 1.036 — ABNORMAL HIGH (ref 1.005–1.030)
pH: 6 (ref 5.0–8.0)

## 2022-06-16 LAB — CBG MONITORING, ED: Glucose-Capillary: 178 mg/dL — ABNORMAL HIGH (ref 70–99)

## 2022-06-16 LAB — APTT: aPTT: 26 seconds (ref 24–36)

## 2022-06-16 LAB — ETHANOL: Alcohol, Ethyl (B): <10 mg/dL (ref <10)

## 2022-06-16 LAB — PROTIME-INR
INR: 1 (ref 0.8–1.2)
Prothrombin Time: 13.4 seconds (ref 11.4–15.2)

## 2022-06-16 MED ORDER — AMLODIPINE BESYLATE 10 MG PO TABS
10.0000 mg | ORAL_TABLET | Freq: Every day | ORAL | Status: DC
  Administered 2022-06-17 – 2022-06-18 (×2): 10 mg via ORAL
  Filled 2022-06-16 (×2): qty 1

## 2022-06-16 MED ORDER — IOHEXOL 350 MG/ML SOLN
75.0000 mL | Freq: Once | INTRAVENOUS | Status: AC | PRN
  Administered 2022-06-16: 75 mL via INTRAVENOUS

## 2022-06-16 MED ORDER — LABETALOL HCL 5 MG/ML IV SOLN
10.0000 mg | Freq: Once | INTRAVENOUS | Status: AC
  Administered 2022-06-16: 10 mg via INTRAVENOUS

## 2022-06-16 MED ORDER — PANTOPRAZOLE SODIUM 40 MG IV SOLR
40.0000 mg | Freq: Every day | INTRAVENOUS | Status: DC
  Administered 2022-06-16: 40 mg via INTRAVENOUS
  Filled 2022-06-16: qty 10

## 2022-06-16 MED ORDER — ORAL CARE MOUTH RINSE: 15.0000 mL | OROMUCOSAL | Status: DC | PRN

## 2022-06-16 MED ORDER — ACETAMINOPHEN 160 MG/5ML PO SOLN: 650.0000 mg | ORAL | Status: DC | PRN

## 2022-06-16 MED ORDER — STROKE: EARLY STAGES OF RECOVERY BOOK
Freq: Once | Status: AC
  Filled 2022-06-16: qty 1

## 2022-06-16 MED ORDER — HYDROCHLOROTHIAZIDE 12.5 MG PO TABS: 12.5000 mg | ORAL_TABLET | Freq: Every day | ORAL | Status: DC

## 2022-06-16 MED ORDER — SENNOSIDES-DOCUSATE SODIUM 8.6-50 MG PO TABS
1.0000 | ORAL_TABLET | Freq: Two times a day (BID) | ORAL | Status: DC
  Administered 2022-06-16 – 2022-06-18 (×3): 1 via ORAL
  Filled 2022-06-16 (×3): qty 1

## 2022-06-16 MED ORDER — ACETAMINOPHEN 650 MG RE SUPP: 650.0000 mg | RECTAL | Status: DC | PRN

## 2022-06-16 MED ORDER — TRAMADOL HCL 50 MG PO TABS: 50.0000 mg | ORAL_TABLET | Freq: Every day | ORAL | Status: DC | PRN

## 2022-06-16 MED ORDER — LISINOPRIL 20 MG PO TABS: 20.0000 mg | ORAL_TABLET | Freq: Every day | ORAL | Status: DC

## 2022-06-16 MED ORDER — ALPRAZOLAM 0.25 MG PO TABS
0.2500 mg | ORAL_TABLET | Freq: Every evening | ORAL | Status: DC | PRN
  Administered 2022-06-16 – 2022-06-17 (×2): 0.25 mg via ORAL
  Filled 2022-06-16 (×2): qty 1

## 2022-06-16 MED ORDER — CLEVIDIPINE BUTYRATE 0.5 MG/ML IV EMUL
0.0000 mg/h | INTRAVENOUS | Status: DC
  Administered 2022-06-16: 2 mg/h via INTRAVENOUS
  Filled 2022-06-16: qty 100

## 2022-06-16 MED ORDER — ACETAMINOPHEN 325 MG PO TABS: 650.0000 mg | ORAL_TABLET | ORAL | Status: DC | PRN

## 2022-06-16 NOTE — Code Documentation (Signed)
[name] is a 89 yr old female arriving to [facility] via EMS on 06/16/2022 with a PMH of hypercalcemia, arthritis, HLD, HTN and sleep apnea. She is not on any thinners. Pt is from home where she was last known well (by family) last night at 1900. She states she was fine when she woke up this morning, but got dizzy and fell while dusting her blinds this morning. EMS was called for the fall, and they noted Lt sided weakness, and activated code stroke.    Pt met by Stroke Team at bridge. Airway cleared by EDP. Labs and CBG obtained. Pt to CT with team. Initial NIHSS 4 (pt drowsy, drift Lt arm and leg, and dysarthric). Please see documentation for NIHSS details and times. The following imaging was obtained: CT head, CTA. Per Dr. Otelia Limes, CT shows non-acute bilateral SDH. CTA negative for LVO per Dr. Otelia Limes.     Pt back to ED Trauma C where her workup will continue. She will need q 2 hr VS and NIHSS. BP goal <180. Cleviprex gtt initiated. Pt is not a candidate for thrombolytic as SDH and OSW. Pt not eligible for thrombectomy as LVO negative. Bedside handoff with Alana RN complete.

## 2022-06-16 NOTE — ED Triage Notes (Signed)
Pt arrived via [facility] as a code stroke. Pt states she was cleaning her blinds and became "swimmy headed" and fell. Per EMS, pt has left side paralysis and couldn't speak to them when she first came on scene. Pt not on thinners.

## 2022-06-16 NOTE — ED Notes (Signed)
Pt 89% on RA. I placed pt on 2L 02 per [place]

## 2022-06-16 NOTE — ED Notes (Signed)
ED TO INPATIENT HANDOFF REPORT  ED Nurse Name and Phone #: Percival Spanish [phone]  S Name/Age/Gender Amber Bolton 89 y.o. female Room/Bed: TRACC/TRACC  Code Status   Code Status: Full Code  Home/SNF/Other Home Patient oriented to: self, place, time, and situation Is this baseline? Yes   Triage Complete: Triage complete  Chief Complaint Subdural hematoma [S06.5XAA]  Triage Note Pt arrived via [facility] as a code stroke. Pt states she was cleaning her blinds and became "swimmy headed" and fell. Per EMS, pt has left side paralysis and couldn't speak to them when she first came on scene. Pt not on thinners.     Allergies Allergies  Allergen Reactions   Penicillins Rash   Shellfish Allergy Itching    MAKES MOUTH ITCH     Level of Care/Admitting Diagnosis ED Disposition     ED Disposition  Admit   Condition  --   Comment  Hospital Area: [facility] [[id number]]  Level of Care: ICU [6]  May admit patient to [facility] or [facility] if equivalent level of care is available:: No  Covid Evaluation: Asymptomatic - no recent exposure (last 10 days) testing not required  Diagnosis: Subdural hematoma [[id number]]  Admitting Physician: Caryl Pina [phone]  Attending Physician: Otelia Limes, ERIC [phone]  Certification:: I certify this patient will need inpatient services for at least 2 midnights  Estimated Length of Stay: 7          B Medical/Surgery History Past Medical History:  Diagnosis Date   Arthritis    IN RIGHT HIP AND FINGERS AND BACK   Hypercalcemia    Hyperlipemia    Resistant hypertension 03/09/2022   Sleep apnea    STATES SHE HAS CPAP - BUT HAS NOT USED IN THE LAST 2 YRS   Past Surgical History:  Procedure Laterality Date   ABDOMINAL HYSTERECTOMY  1978   HEMORRHOID SURGERY  1960'S   TOTAL HIP ARTHROPLASTY Right 08/23/2012   Procedure: RIGHT TOTAL HIP ARTHROPLASTY ANTERIOR APPROACH;  Surgeon: [name], MD;  Location: [facility];  Service:  Orthopedics;  Laterality: Right;     A IV Location/Drains/Wounds Patient Lines/Drains/Airways Status     Active Line/Drains/Airways     Name Placement date Placement time Site Days   Peripheral IV 12/28/21 20 G Anterior;Left Forearm 12/28/21  --  Forearm  170   Closed System Drain 1 Right Hip Accordion (Hemovac) 9 Fr. 08/23/12  1121  Hip  3584   Incision 08/23/12 Hip Right 08/23/12  1123  -- 3584            Intake/Output Last 24 hours No intake or output data in the 24 hours ending 06/16/22 1631  Labs/Imaging Results for orders placed or performed during the hospital encounter of 06/16/22 (from the past 48 hour(s))  Ethanol     Status: None   Collection Time: 06/16/22 12:25 PM  Result Value Ref Range   Alcohol, Ethyl (B) <10 <10 mg/dL    Comment: (NOTE) Lowest detectable limit for serum alcohol is 10 mg/dL.  For medical purposes only. Performed at [facility], [address] [place], [place] [address]   Protime-INR     Status: None   Collection Time: 06/16/22 12:25 PM  Result Value Ref Range   Prothrombin Time 13.4 11.4 - 15.2 seconds   INR 1.0 0.8 - 1.2    Comment: (NOTE) INR goal varies based on device and disease states. Performed at [facility], [address] [place], [place] [address]   APTT     Status: None   Collection Time: 06/16/22 12:25 PM  Result Value Ref Range   aPTT 26 24 - 36 seconds    Comment: Performed at [facility], [address] [place], [place] [address]  CBC     Status: Abnormal   Collection Time: 06/16/22 12:25 PM  Result Value Ref Range   WBC 4.0 4.0 - 10.5 K/uL   RBC 5.51 (H) 3.87 - 5.11 MIL/uL   Hemoglobin 16.2 (H) 12.0 - 15.0 g/dL   HCT 09.8 (H) 11.9 - 14.7 %   MCV 94.0 80.0 - 100.0 fL   MCH 29.4 26.0 - 34.0 pg   MCHC 31.3 30.0 - 36.0 g/dL   RDW 82.9 56.2 - 13.0 %   Platelets 150 150 - 400 K/uL   nRBC 0.0 0.0 - 0.2 %    Comment: Performed at [facility], [address] [place], [place]  [address]  Differential     Status: Abnormal   Collection Time: 06/16/22 12:25 PM  Result Value Ref Range   Neutrophils Relative % 38 %   Neutro Abs 1.5 (L) 1.7 - 7.7 K/uL   Lymphocytes Relative 51 %   Lymphs Abs 2.1 0.7 - 4.0 K/uL   Monocytes Relative 8 %   Monocytes Absolute 0.3 0.1 - 1.0 K/uL   Eosinophils Relative 2 %   Eosinophils Absolute 0.1 0.0 - 0.5 K/uL   Basophils Relative 1 %   Basophils Absolute 0.0 0.0 - 0.1 K/uL   Immature Granulocytes 0 %   Abs Immature Granulocytes 0.01 0.00 - 0.07 K/uL    Comment: Performed at [facility], [address], [place], [place] [address]  Comprehensive metabolic panel     Status: Abnormal   Collection Time: 06/16/22 12:25 PM  Result Value Ref Range   Sodium 137 135 - 145 mmol/L   Potassium 3.6 3.5 - 5.1 mmol/L   Chloride 102 98 - 111 mmol/L   CO2 22 22 - 32 mmol/L   Glucose, Bld 182 (H) 70 - 99 mg/dL    Comment: Glucose reference range applies only to samples taken after fasting for at least 8 hours.   BUN 17 8 - 23 mg/dL   Creatinine, Ser 9.52 (H) 0.44 - 1.00 mg/dL   Calcium 9.5 8.9 - 84.1 mg/dL   Total Protein 6.6 6.5 - 8.1 g/dL   Albumin 3.5 3.5 - 5.0 g/dL   AST 17 15 - 41 U/L   ALT 11 0 - 44 U/L   Alkaline Phosphatase 61 38 - 126 U/L   Total Bilirubin 1.1 0.3 - 1.2 mg/dL   GFR, Estimated 46 (L) >60 mL/min    Comment: (NOTE) Calculated using the CKD-EPI Creatinine Equation (2021)    Anion gap 13 5 - 15    Comment: Performed at [facility], [address], [place], [place] [address]  CBG monitoring, ED     Status: Abnormal   Collection Time: 06/16/22 12:26 PM  Result Value Ref Range   Glucose-Capillary 178 (H) 70 - 99 mg/dL    Comment: Glucose reference range applies only to samples taken after fasting for at least 8 hours.  I-stat chem 8, ed     Status: Abnormal   Collection Time: 06/16/22 12:30 PM  Result Value Ref Range   Sodium 137 135 - 145 mmol/L   Potassium 3.7 3.5 - 5.1 mmol/L   Chloride 105  98 - 111  mmol/L   BUN 19 8 - 23 mg/dL   Creatinine, Ser 8.29 0.44 - 1.00 mg/dL   Glucose, Bld 562 (H) 70 - 99 mg/dL    Comment: Glucose reference range applies only to samples taken after fasting for at least 8 hours.   Calcium, Ion 1.05 (L) 1.15 - 1.40 mmol/L   TCO2 27 22 - 32 mmol/L   Hemoglobin 17.0 (H) 12.0 - 15.0 g/dL   HCT 13.0 (H) 86.5 - 78.4 %  Urinalysis, Routine w reflex microscopic -Urine, Clean Catch     Status: Abnormal   Collection Time: 06/16/22  3:07 PM  Result Value Ref Range   Color, Urine YELLOW YELLOW   APPearance CLEAR CLEAR   Specific Gravity, Urine 1.036 (H) 1.005 - 1.030   pH 6.0 5.0 - 8.0   Glucose, UA NEGATIVE NEGATIVE mg/dL   Hgb urine dipstick NEGATIVE NEGATIVE   Bilirubin Urine NEGATIVE NEGATIVE   Ketones, ur 20 (A) NEGATIVE mg/dL   Protein, ur 696 (A) NEGATIVE mg/dL   Nitrite NEGATIVE NEGATIVE   Leukocytes,Ua NEGATIVE NEGATIVE   RBC / HPF 0-5 0 - 5 RBC/hpf   WBC, UA 0-5 0 - 5 WBC/hpf   Bacteria, UA RARE (A) NONE SEEN   Squamous Epithelial / HPF 0-5 0 - 5 /HPF   Mucus PRESENT     Comment: Performed at [facility], [address], [place], [place] [address]   DG Knee Complete 4 Views Left  Result Date: 06/16/2022 CLINICAL DATA:  Fall EXAM: LEFT KNEE - COMPLETE 4 VIEW COMPARISON:  None Available. FINDINGS: No evidence of fracture, dislocation, or joint effusion. Calcific density within the joint space could be seen in the setting of CPPD arthropathy. Vascular calcifications. Suprapatellar enthesophyte. IMPRESSION: 1. No fracture or dislocation. 2. Findings usggestive of CPPD arthropathy. Electronically Signed   By: Lorenza Cambridge M.D.   On: 06/16/2022 16:10   CT ANGIO HEAD NECK W WO CM (CODE STROKE)  Result Date: 06/16/2022 CLINICAL DATA:  Neuro deficit, acute, stroke suspected. EXAM: CT ANGIOGRAPHY HEAD AND NECK WITH AND WITHOUT CONTRAST TECHNIQUE: Multidetector CT imaging of the head and neck was performed using the standard protocol during bolus  administration of intravenous contrast. Multiplanar CT image reconstructions and MIPs were obtained to evaluate the vascular anatomy. Carotid stenosis measurements (when applicable) are obtained utilizing NASCET criteria, using the distal internal carotid diameter as the denominator. RADIATION DOSE REDUCTION: This exam was performed according to the departmental dose-optimization program which includes automated exposure control, adjustment of the mA and/or kV according to patient size and/or use of iterative reconstruction technique. CONTRAST:  75mL OMNIPAQUE IOHEXOL 350 MG/ML SOLN COMPARISON:  Head CT 06/16/2022. FINDINGS: CTA NECK FINDINGS Aortic arch: Incompletely imaged. Visualized arch vessel origins are patent. Right carotid system: Irregular narrowing of the mid and distal right cervical ICA, consistent with fibromuscular dysplasia. Left carotid system: Irregular narrowing of the mid and distal left cervical ICA, consistent with fibromuscular dysplasia. Vertebral arteries: Right dominant vertebral artery. Both vessels are patent to the skull base. Skeleton: Extensive degenerative changes of the cervical spine with at least moderate stenosis at C5-6. Other neck: Unremarkable. Upper chest: Unremarkable. Review of the MIP images confirms the above findings CTA HEAD FINDINGS Anterior circulation: Intracranial ICAs are patent without stenosis or aneurysm. The proximal ACAs and MCAs are patent without stenosis or aneurysm. Distal branches are symmetric. Posterior circulation: The left vertebral artery functionally terminates in PICA. The right vertebral artery and basilar  arteries are patent without stenosis or aneurysm. The SCAs, AICAs and PICAs are patent proximally. The PCAs are patent proximally without stenosis or aneurysm. Distal branches are symmetric. Venous sinuses: As permitted by contrast timing, patent. Anatomic variants: Persistent fetal origin of the left PCA with hypoplastic left P1 segment. Review  of the MIP images confirms the above findings IMPRESSION: 1. No large vessel occlusion or significant stenosis of the head or neck vessels. 2. 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 Electronically Signed   By: Orvan Falconer M.D.   On: 06/16/2022 13:06   CT HEAD CODE STROKE WO CONTRAST`  Result Date: 06/16/2022 CLINICAL DATA:  Code stroke. Neuro deficit with acute stroke suspected EXAM: CT HEAD WITHOUT CONTRAST TECHNIQUE: Contiguous axial images were obtained from the base of the skull through the vertex without intravenous contrast. RADIATION DOSE REDUCTION: This exam was performed according to the departmental dose-optimization program which includes automated exposure control, adjustment of the mA and/or kV according to patient size and/or use of iterative reconstruction technique. COMPARISON:  11/21/2020 FINDINGS: Brain: Thin isodense subdural collection along the bilateral cerebral convexity measuring up to 4 mm thickness on the right and 3 mm in the left, see coronal reformats. Chronic small vessel ischemia in the cerebral white matter. Generalized cerebral volume loss. Vascular: No hyperdense vessel or unexpected calcification. Skull: Normal. Negative for fracture or focal lesion. Sinuses/Orbits: No acute finding. Other: These results were called by telephone at the time of interpretation on 06/16/2022 at 12:40 pm to provider ERIC [facility] , who verbally acknowledged these results. ASPECTS [facility] Stroke Program Early CT Score) Not scored given the above. IMPRESSION: 1. Nonacute/isodense subdural hematoma along the bilateral cerebral convexity measuring up to 4 mm in thickness on the right. 2. Chronic small vessel ischemia and atrophy. Electronically Signed   By: Tiburcio Pea M.D.   On: 06/16/2022 12:46    Pending Labs Unresulted Labs (From admission, onward)     Start     Ordered   06/16/22 1247  Urine rapid drug screen (hosp performed)  Once,   STAT        06/16/22 1246             Vitals/Pain Today's Vitals   06/16/22 1445 06/16/22 1450 06/16/22 1455 06/16/22 1545  BP: (!) 154/91 (!) 168/79 (!) 161/85 (!) 174/76  Pulse: 66 61 65 72  Resp: 18 10 13 19   SpO2: 96% 96% 97% 98%  PainSc:        Isolation Precautions No active isolations  Medications Medications  clevidipine (CLEVIPREX) infusion 0.5 mg/mL (2 mg/hr Intravenous Rate/Dose Change 06/16/22 1406)   stroke: early stages of recovery book (has no administration in time range)  acetaminophen (TYLENOL) tablet 650 mg (has no administration in time range)    Or  acetaminophen (TYLENOL) 160 MG/5ML solution 650 mg (has no administration in time range)    Or  acetaminophen (TYLENOL) suppository 650 mg (has no administration in time range)  senna-docusate (Senokot-S) tablet 1 tablet (has no administration in time range)  pantoprazole (PROTONIX) injection 40 mg (has no administration in time range)  ALPRAZolam (XANAX) tablet 0.25 mg (has no administration in time range)  amLODipine (NORVASC) tablet 10 mg (has no administration in time range)  hydrochlorothiazide (HYDRODIURIL) tablet 12.5 mg (has no administration in time range)  lisinopril (ZESTRIL) tablet 20 mg (has no administration in time range)  traMADol (ULTRAM) tablet 50 mg (has no administration in time range)  labetalol (NORMODYNE) injection 10 mg (10  mg Intravenous Given 06/16/22 1252)  iohexol (OMNIPAQUE) 350 MG/ML injection 75 mL (75 mLs Intravenous Contrast Given 06/16/22 1254)    Mobility walks with device     Focused Assessments Neuro Assessment Handoff:  Swallow screen pass? Yes  Cardiac Rhythm: Normal sinus rhythm, Bundle branch block, Other (Comment) (ST elevation) NIH Stroke Scale  Dizziness Present: No Headache Present: No Interval: Other (Comment) Level of Consciousness (1a.)   : Alert, keenly responsive LOC Questions (1b. )   : Answers both questions correctly LOC Commands (1c. )   : Performs both tasks  correctly Best Gaze (2. )  : Normal Visual (3. )  : No visual loss Facial Palsy (4. )    : Normal symmetrical movements Motor Arm, Left (5a. )   : No drift Motor Arm, Right (5b. ) : No drift Motor Leg, Left (6a. )  : No drift Motor Leg, Right (6b. ) : No drift Limb Ataxia (7. ): Absent Sensory (8. )  : Normal, no sensory loss Best Language (9. )  : No aphasia Dysarthria (10. ): Normal Extinction/Inattention (11.)   : No Abnormality Complete NIHSS TOTAL: 0 Last date known well: 06/16/22 Last time known well: 1145 Neuro Assessment:   Neuro Checks:   Initial (06/16/22 1230)  Has TPA been given? No If patient is a Neuro Trauma and patient is going to OR before floor call report to 4N Charge nurse: [phone] or [phone]   R Recommendations: See Admitting Provider Note  Report given to:   Additional Notes:

## 2022-06-16 NOTE — H&P (Addendum)
NEUROHOSPITALIST ADMISSION HISTORY AND PHYSICAL   Requesting physician: Dr. Rubin Payor  Reason for Consult: Acute onset of left sided weakness  History obtained from:  EMS, Patient and Chart     HPI:                                                                                                                                          Amber Bolton is a 89 y.o. female with a PMHx of hypercalcemia, arthritis, HLD, HTN and sleep apnea who presents via EMS as a Code Stroke after acute onset of left sided weakness at home. LKN was at 1145 when the patient was adjusting her blinds due to it being a bright day outside. She suddenly felt "swimmy headed" and fell to the floor. She called out for a family member who noted the patient was weak on the left. EMS was called and on their arrival she was almost completely flaccid on the left and unable to speak. Initial BP was 260/110 which improved to 214/100 en route. Her left sided weakness started to improve during transport to the ED and speech also started to return.   Past Medical History:  Diagnosis Date   Arthritis    IN RIGHT HIP AND FINGERS AND BACK   Hypercalcemia    Hyperlipemia    Resistant hypertension 03/09/2022   Sleep apnea    STATES SHE HAS CPAP - BUT HAS NOT USED IN THE LAST 2 YRS    Past Surgical History:  Procedure Laterality Date   ABDOMINAL HYSTERECTOMY  1978   HEMORRHOID SURGERY  1960'S   TOTAL HIP ARTHROPLASTY Right 08/23/2012   Procedure: RIGHT TOTAL HIP ARTHROPLASTY ANTERIOR APPROACH;  Surgeon: [name], MD;  Location: [facility];  Service: Orthopedics;  Laterality: Right;    No family history on file.          Social History:  reports that she has never smoked. She has never used smokeless tobacco. She reports that she does not drink alcohol and does not use drugs.  Allergies  Allergen Reactions   Penicillins Rash   Shellfish Allergy Itching    MAKES MOUTH [facility]     HOME MEDICATIONS:  No current facility-administered medications on file prior to encounter.   Current Outpatient Medications on File Prior to Encounter  Medication Sig Dispense Refill   acetaminophen (TYLENOL) 325 MG tablet Take 2 tablets (650 mg total) by mouth every 6 (six) hours as needed. 30 tablet 0   ALPRAZolam (XANAX) 0.5 MG tablet Take 0.25 mg by mouth at bedtime as needed for sleep.      amLODipine (NORVASC) 10 MG tablet Take 10 mg by mouth daily.     docusate sodium 100 MG CAPS Take 100 mg by mouth 2 (two) times daily. 10 capsule 0   hydrochlorothiazide (HYDRODIURIL) 12.5 MG tablet Take 1 tablet (12.5 mg total) by mouth daily. 45 tablet 3   lisinopril (PRINIVIL,ZESTRIL) 20 MG tablet Take 20 mg by mouth daily.     ondansetron (ZOFRAN) 4 MG tablet Take 1 tablet (4 mg total) by mouth every 8 (eight) hours as needed for nausea or vomiting. 20 tablet 0   traMADol (ULTRAM) 50 MG tablet Take 50 mg by mouth daily as needed for moderate pain.       ROS:                                                                                                                                       As per HPI. She does not endorse any additional symptoms.  BP (!) 174/76   Pulse 72   Resp 19   SpO2 98%   General Examination:                                                                                                       Physical Exam  HEENT-  [place]/AT   Lungs- Respirations unlabored Extremities- No edema   Neurological Examination Mental Status: Awake with decreased level of alertness and slowed, mildly dysarthric speech initially, followed by improvement during CT and then complete resolution after being transported to ED exam room after CT.  Speech after CT was fluent with intact naming and comprehension. Fully oriented.  Cranial Nerves: II: Temporal visual fields intact with no extinction to DSS.  PERRL.  III,IV, VI: EOMI. No ptosis. No nystagmus V,VII: Smile symmetric.  VIII: Hearing intact to voice IX,X: No hypophonia or hoarseness XI: Head is midline XII: Midline tongue extension Motor: RUE 5/5 RLE 5/5 LUE 4/5 deltoid, 4+/5 triceps and grip, otherwise 5/5 LLE 4+/5 Sensory: Light touch intact throughout. Initially with left sided extinction to DSS.  Deep Tendon Reflexes: 1+ and symmetric throughout  Cerebellar: No ataxia with FNF or H-S bilaterally.  Gait: Deferred   Lab Results: Basic Metabolic Panel: Recent Labs  Lab 06/16/22 1230  NA 137  K 3.7  CL 105  GLUCOSE 172*  BUN 19  CREATININE 1.00    CBC: Recent Labs  Lab 06/16/22 1230  HGB 17.0*  HCT 50.0*    Cardiac Enzymes: No results for input(s): "CKTOTAL", "CKMB", "CKMBINDEX", "TROPONINI" in the last 168 hours.  Lipid Panel: No results for input(s): "CHOL", "TRIG", "HDL", "CHOLHDL", "VLDL", "LDLCALC" in the last 168 hours.  Imaging: No results found.  Assessment: 89 y.o. female with a PMHx of hypercalcemia, arthritis, HLD, HTN and sleep apnea who presents via EMS as a Code Stroke after acute onset of left sided weakness at home. LKN was at 1145 when the patient was adjusting her blinds due to it being a bright day outside. She suddenly felt "swimmy headed" and fell to the floor. She called out for a family member who noted the patient was weak on the left. EMS was called and on their arrival she was almost completely flaccid on the left and unable to speak. Initial BP was 260/110 which improved to 214/100 en route. Her left sided weakness started to improve during transport to the ED and speech also started to return.  - Exam reveals rapidly improving deficits. Left sided weakness is mild following CT. Initially was with flaccid LUE and LLE per EMS.  - CT head: Nonacute/isodense subdural hematoma along the bilateral cerebral convexity measuring up to 4 mm in thickness on the right. Chronic small vessel  ischemia and atrophy. - CTA of head and neck: No large vessel occlusion or significant stenosis of the head or neck vessels. 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 - Overall presentation is most consistent with a right cerebral hemisphere watershed TIA with possible residual small stroke. The small subdural hematomas on CT are not of sufficient size to explain her presentation.   Plan: - The patient requests that she be designated as a Full Code.  - Admit to ICU under Neurology service for bilateral subdural hematomas and TIA work up.  - MRI brain - TTE - Cardiac telemetry - Given advanced age, will hold off on statin therapy  - PT consult, OT consult, Speech consult - Cardiac telemetry - Frequent neuro checks - Respiratory consult for BIPAP or CPAP machine during this admission (has sleep apnea) - BP management with clevidipine drip. Modified permissive HTN protocol: Treat SBP if > 180 - No antiplatelet medications or anticoagulants given bilateral subdural hematomas - HgbA1c, fasting lipid panel - Frequent neuro checks - NPO until passes stroke swallow screen   Electronically signed: Dr. Caryl Pina 06/16/2022, 12:32 PM

## 2022-06-16 NOTE — ED Provider Notes (Signed)
[place] EMERGENCY DEPARTMENT AT [facility] Provider Note   CSN: [id number] Arrival date & time: 06/16/22  1223  An emergency department physician performed an initial assessment on this suspected stroke patient at 53.  History  Chief Complaint  Patient presents with   Code Stroke    Amber Bolton is a 89 y.o. female.  HPI Patient presented as a code stroke.  Last normal apparently about a an hour prior to arrival.  Reportedly had been up and was normal and then stumbled because she had left-sided weakness.  Came in as a code stroke.  Did have mild left-sided headache with it.  Reportedly flaccid for EMS on the left.  Here has had some improvement.   Past Medical History:  Diagnosis Date   Arthritis    IN RIGHT HIP AND FINGERS AND BACK   Hypercalcemia    Hyperlipemia    Resistant hypertension 03/09/2022   Sleep apnea    STATES SHE HAS CPAP - BUT HAS NOT USED IN THE LAST 2 YRS    Home Medications Prior to Admission medications   Medication Sig Start Date End Date Taking? Authorizing Provider  acetaminophen (TYLENOL) 325 MG tablet Take 2 tablets (650 mg total) by mouth every 6 (six) hours as needed. 11/21/20   Franne Forts, DO  ALPRAZolam Prudy Feeler) 0.5 MG tablet Take 0.25 mg by mouth at bedtime as needed for sleep.     [provider]  amLODipine (NORVASC) 10 MG tablet Take 10 mg by mouth daily.    [provider]  docusate sodium 100 MG CAPS Take 100 mg by mouth 2 (two) times daily. 08/24/12   Lanney Gins, PA-C  hydrochlorothiazide (HYDRODIURIL) 12.5 MG tablet Take 1 tablet (12.5 mg total) by mouth daily. 03/09/22   Chilton Si, MD  lisinopril (PRINIVIL,ZESTRIL) 20 MG tablet Take 20 mg by mouth daily.    [provider]  ondansetron (ZOFRAN) 4 MG tablet Take 1 tablet (4 mg total) by mouth every 8 (eight) hours as needed for nausea or vomiting. 12/28/21   Darrick Grinder, PA-C  traMADol (ULTRAM) 50 MG tablet Take 50 mg by  mouth daily as needed for moderate pain.    [provider]      Allergies    Penicillins and Shellfish allergy    Review of Systems   Review of Systems  Physical Exam Updated Vital Signs BP (!) 161/85   Pulse 65   Resp 13   SpO2 97%  Physical Exam Vitals and nursing note reviewed.  HENT:     Head: Atraumatic.  Eyes:     Extraocular Movements: Extraocular movements intact.  Cardiovascular:     Rate and Rhythm: Normal rate.  Abdominal:     Tenderness: There is no abdominal tenderness.  Skin:    General: Skin is warm.  Neurological:     Mental Status: She is alert.     Comments: Initially weak on left compared to right but improving exam.  Eye movement intact.  Face symmetric.     ED Results / Procedures / Treatments   Labs (all labs ordered are listed, but only abnormal results are displayed) Labs Reviewed  CBC - Abnormal; Notable for the following components:      Result Value   RBC 5.51 (*)    Hemoglobin 16.2 (*)    HCT 51.8 (*)    All other components within normal limits  DIFFERENTIAL - Abnormal; Notable for the following components:  Neutro Abs 1.5 (*)    All other components within normal limits  COMPREHENSIVE METABOLIC PANEL - Abnormal; Notable for the following components:   Glucose, Bld 182 (*)    Creatinine, Ser 1.13 (*)    GFR, Estimated 46 (*)    All other components within normal limits  CBG MONITORING, ED - Abnormal; Notable for the following components:   Glucose-Capillary 178 (*)    All other components within normal limits  I-STAT CHEM 8, ED - Abnormal; Notable for the following components:   Glucose, Bld 172 (*)    Calcium, Ion 1.05 (*)    Hemoglobin 17.0 (*)    HCT 50.0 (*)    All other components within normal limits  ETHANOL  PROTIME-INR  APTT  RAPID URINE DRUG SCREEN, HOSP PERFORMED  URINALYSIS, ROUTINE W REFLEX MICROSCOPIC  I-STAT CHEM 8, ED    EKG None  Radiology CT ANGIO HEAD NECK W WO CM (CODE STROKE)  Result  Date: 06/16/2022 CLINICAL DATA:  Neuro deficit, acute, stroke suspected. EXAM: CT ANGIOGRAPHY HEAD AND NECK WITH AND WITHOUT CONTRAST TECHNIQUE: Multidetector CT imaging of the head and neck was performed using the standard protocol during bolus administration of intravenous contrast. Multiplanar CT image reconstructions and MIPs were obtained to evaluate the vascular anatomy. Carotid stenosis measurements (when applicable) are obtained utilizing NASCET criteria, using the distal internal carotid diameter as the denominator. RADIATION DOSE REDUCTION: This exam was performed according to the departmental dose-optimization program which includes automated exposure control, adjustment of the mA and/or kV according to patient size and/or use of iterative reconstruction technique. CONTRAST:  75mL OMNIPAQUE IOHEXOL 350 MG/ML SOLN COMPARISON:  Head CT 06/16/2022. FINDINGS: CTA NECK FINDINGS Aortic arch: Incompletely imaged. Visualized arch vessel origins are patent. Right carotid system: Irregular narrowing of the mid and distal right cervical ICA, consistent with fibromuscular dysplasia. Left carotid system: Irregular narrowing of the mid and distal left cervical ICA, consistent with fibromuscular dysplasia. Vertebral arteries: Right dominant vertebral artery. Both vessels are patent to the skull base. Skeleton: Extensive degenerative changes of the cervical spine with at least moderate stenosis at C5-6. Other neck: Unremarkable. Upper chest: Unremarkable. Review of the MIP images confirms the above findings CTA HEAD FINDINGS Anterior circulation: Intracranial ICAs are patent without stenosis or aneurysm. The proximal ACAs and MCAs are patent without stenosis or aneurysm. Distal branches are symmetric. Posterior circulation: The left vertebral artery functionally terminates in PICA. The right vertebral artery and basilar arteries are patent without stenosis or aneurysm. The SCAs, AICAs and PICAs are patent proximally. The  PCAs are patent proximally without stenosis or aneurysm. Distal branches are symmetric. Venous sinuses: As permitted by contrast timing, patent. Anatomic variants: Persistent fetal origin of the left PCA with hypoplastic left P1 segment. Review of the MIP images confirms the above findings IMPRESSION: 1. No large vessel occlusion or significant stenosis of the head or neck vessels. 2. 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 Electronically Signed   By: Orvan Falconer M.D.   On: 06/16/2022 13:06   CT HEAD CODE STROKE WO CONTRAST`  Result Date: 06/16/2022 CLINICAL DATA:  Code stroke. Neuro deficit with acute stroke suspected EXAM: CT HEAD WITHOUT CONTRAST TECHNIQUE: Contiguous axial images were obtained from the base of the skull through the vertex without intravenous contrast. RADIATION DOSE REDUCTION: This exam was performed according to the departmental dose-optimization program which includes automated exposure control, adjustment of the mA and/or kV according to patient size and/or use of iterative reconstruction technique.  COMPARISON:  11/21/2020 FINDINGS: Brain: Thin isodense subdural collection along the bilateral cerebral convexity measuring up to 4 mm thickness on the right and 3 mm in the left, see coronal reformats. Chronic small vessel ischemia in the cerebral white matter. Generalized cerebral volume loss. Vascular: No hyperdense vessel or unexpected calcification. Skull: Normal. Negative for fracture or focal lesion. Sinuses/Orbits: No acute finding. Other: These results were called by telephone at the time of interpretation on 06/16/2022 at 12:40 pm to provider ERIC [facility] , who verbally acknowledged these results. ASPECTS [facility] Stroke Program Early CT Score) Not scored given the above. IMPRESSION: 1. Nonacute/isodense subdural hematoma along the bilateral cerebral convexity measuring up to 4 mm in thickness on the right. 2. Chronic small vessel ischemia and atrophy.  Electronically Signed   By: Tiburcio Pea M.D.   On: 06/16/2022 12:46    Procedures Procedures    Medications Ordered in ED Medications  clevidipine (CLEVIPREX) infusion 0.5 mg/mL (2 mg/hr Intravenous Rate/Dose Change 06/16/22 1406)  labetalol (NORMODYNE) injection 10 mg (10 mg Intravenous Given 06/16/22 1252)  iohexol (OMNIPAQUE) 350 MG/ML injection 75 mL (75 mLs Intravenous Contrast Given 06/16/22 1254)    ED Course/ Medical Decision Making/ A&P                             Medical Decision Making Amount and/or Complexity of Data Reviewed Labs: ordered.  Risk Decision regarding hospitalization.   Patient presented as a code stroke.  Left-sided weakness.  Improving.  Met by myself and neurology at the [facility].  Differential diagnose include strokes, tumor, seizure.  Not a TNK candidate due to subdural hematomas and a proving same.  Discussed with Dr. Otelia Limes who stated that neurology will admit.  CRITICAL CARE Performed by: Benjiman Core Total critical care time: 30 minutes Critical care time was exclusive of separately billable procedures and treating other patients. Critical care was necessary to treat or prevent imminent or life-threatening deterioration. Critical care was time spent personally by me on the following activities: development of treatment plan with patient and/or surrogate as well as nursing, discussions with consultants, evaluation of patient's response to treatment, examination of patient, obtaining history from patient or surrogate, ordering and performing treatments and interventions, ordering and review of laboratory studies, ordering and review of radiographic studies, pulse oximetry and re-evaluation of patient's condition.         Final Clinical Impression(s) / ED Diagnoses Final diagnoses:  Weakness  Subdural hematoma    Rx / DC Orders ED Discharge Orders     None         Benjiman Core, MD 06/16/22 1515

## 2022-06-17 ENCOUNTER — Inpatient Hospital Stay (HOSPITAL_COMMUNITY): Payer: Medicare Other

## 2022-06-17 ENCOUNTER — Other Ambulatory Visit (HOSPITAL_COMMUNITY): Payer: Medicare Other

## 2022-06-17 DIAGNOSIS — R569 Unspecified convulsions: Secondary | ICD-10-CM

## 2022-06-17 DIAGNOSIS — I161 Hypertensive emergency: Secondary | ICD-10-CM | POA: Diagnosis not present

## 2022-06-17 DIAGNOSIS — S065XAA Traumatic subdural hemorrhage with loss of consciousness status unknown, initial encounter: Secondary | ICD-10-CM | POA: Diagnosis not present

## 2022-06-17 LAB — BASIC METABOLIC PANEL
Anion gap: 7 (ref 5–15)
BUN: 16 mg/dL (ref 8–23)
CO2: 27 mmol/L (ref 22–32)
Calcium: 9.7 mg/dL (ref 8.9–10.3)
Chloride: 103 mmol/L (ref 98–111)
Creatinine, Ser: 1.25 mg/dL — ABNORMAL HIGH (ref 0.44–1.00)
GFR, Estimated: 41 mL/min — ABNORMAL LOW (ref >60)
Glucose, Bld: 149 mg/dL — ABNORMAL HIGH (ref 70–99)
Potassium: 3.8 mmol/L (ref 3.5–5.1)
Sodium: 137 mmol/L (ref 135–145)

## 2022-06-17 LAB — CBC
HCT: 47.2 % — ABNORMAL HIGH (ref 36.0–46.0)
Hemoglobin: 15.2 g/dL — ABNORMAL HIGH (ref 12.0–15.0)
MCH: 29.9 pg (ref 26.0–34.0)
MCHC: 32.2 g/dL (ref 30.0–36.0)
MCV: 92.7 fL (ref 80.0–100.0)
Platelets: 149 10*3/uL — ABNORMAL LOW (ref 150–400)
RBC: 5.09 MIL/uL (ref 3.87–5.11)
RDW: 12 % (ref 11.5–15.5)
WBC: 5.2 10*3/uL (ref 4.0–10.5)
nRBC: 0 % (ref 0.0–0.2)

## 2022-06-17 LAB — LIPID PANEL
Cholesterol: 204 mg/dL — ABNORMAL HIGH (ref 0–200)
HDL: 44 mg/dL (ref >40)
LDL Cholesterol: 137 mg/dL — ABNORMAL HIGH (ref 0–99)
Total CHOL/HDL Ratio: 4.6 RATIO
Triglycerides: 117 mg/dL (ref <150)
VLDL: 23 mg/dL (ref 0–40)

## 2022-06-17 LAB — HEMOGLOBIN A1C
Hgb A1c MFr Bld: 7.3 % — ABNORMAL HIGH (ref 4.8–5.6)
Mean Plasma Glucose: 162.81 mg/dL

## 2022-06-17 MED ORDER — ROSUVASTATIN CALCIUM 5 MG PO TABS
10.0000 mg | ORAL_TABLET | Freq: Every day | ORAL | Status: DC
  Administered 2022-06-17 – 2022-06-18 (×2): 10 mg via ORAL
  Filled 2022-06-17 (×2): qty 2

## 2022-06-17 MED ORDER — PANTOPRAZOLE SODIUM 40 MG PO TBEC
40.0000 mg | DELAYED_RELEASE_TABLET | Freq: Every day | ORAL | Status: DC
  Administered 2022-06-17: 40 mg via ORAL
  Filled 2022-06-17: qty 2

## 2022-06-17 MED ORDER — CHLORHEXIDINE GLUCONATE CLOTH 2 % EX PADS
6.0000 | MEDICATED_PAD | Freq: Every day | CUTANEOUS | Status: DC
  Administered 2022-06-17: 6 via TOPICAL

## 2022-06-17 MED ORDER — LISINOPRIL 20 MG PO TABS
40.0000 mg | ORAL_TABLET | Freq: Every day | ORAL | Status: DC
  Administered 2022-06-17 – 2022-06-18 (×2): 40 mg via ORAL
  Filled 2022-06-17 (×2): qty 2

## 2022-06-17 MED ORDER — SODIUM CHLORIDE 0.9 % IV SOLN: INTRAVENOUS | Status: DC

## 2022-06-17 MED ORDER — LEVETIRACETAM 250 MG PO TABS
250.0000 mg | ORAL_TABLET | Freq: Two times a day (BID) | ORAL | Status: DC
  Administered 2022-06-17 – 2022-06-18 (×3): 250 mg via ORAL
  Filled 2022-06-17 (×4): qty 1

## 2022-06-17 MED ORDER — HYDROCHLOROTHIAZIDE 25 MG PO TABS
25.0000 mg | ORAL_TABLET | Freq: Every day | ORAL | Status: DC
  Administered 2022-06-17 – 2022-06-18 (×2): 25 mg via ORAL
  Filled 2022-06-17 (×2): qty 1

## 2022-06-17 MED ORDER — HEPARIN SODIUM (PORCINE) 5000 UNIT/ML IJ SOLN
5000.0000 [IU] | Freq: Two times a day (BID) | INTRAMUSCULAR | Status: DC
  Administered 2022-06-17 – 2022-06-18 (×3): 5000 [IU] via SUBCUTANEOUS
  Filled 2022-06-17 (×3): qty 1

## 2022-06-17 NOTE — Procedures (Signed)
Patient Name: Amber Bolton  MRN: [id number]  Epilepsy Attending: Charlsie Quest  Referring Physician/Provider: Marvel Plan, MD  Date: 06/17/2022 Duration: 24 mins  Patient history: 89 y.o. female with a PMHx of hypercalcemia, arthritis, HLD, HTN and sleep apnea who presents via EMS as a Code Stroke after acute onset of left sided weakness at home. EEG to evaluate for seizure.   Level of alertness: Awake  AEDs during EEG study: None  Technical aspects: This EEG study was done with scalp electrodes positioned according to the 10-20 International system of electrode placement. Electrical activity was reviewed with band pass filter of 1-70Hz , sensitivity of 7 uV/mm, display speed of 24mm/sec with a  notched filter applied as appropriate. EEG data were recorded continuously and digitally stored.  Video monitoring was available and reviewed as appropriate.  Description: The posterior dominant rhythm consists of 8 Hz activity of moderate voltage (25-35 uV) seen predominantly in posterior head regions, symmetric and reactive to eye opening and eye closing. Hyperventilation and photic stimulation were not performed.     IMPRESSION: This study is within normal limits. No seizures or epileptiform discharges were seen throughout the recording.  A normal interictal EEG does not exclude the diagnosis of epilepsy.  [name]

## 2022-06-17 NOTE — Progress Notes (Signed)
STROKE TEAM PROGRESS NOTE   INTERVAL HISTORY No family at bedside. Patient sitting up in chair, eating breakfast.  Admitted 4/16 due to bilateral subdural hematomas and for TIA workup. Imaging negative for acute infarct.  Cleviprex off and on, BP goal <160. Once BP is controlled with PO meds, can transfer out of ICU.   Vitals:   06/17/22 0605 06/17/22 0700 06/17/22 0702 06/17/22 0800  BP:  (!) 186/100 (!) 175/74 (!) 150/101  Pulse: (!) 46 62 (!) 56 79  Resp: Temp:    97.8 F (36.6 C)  TempSrc:    Oral  SpO2: 93% 94% 95% 95%  Weight:    60.4 kg   CBC:  Recent Labs  Lab 06/16/22 1225 06/16/22 1230 06/17/22 0227  WBC 4.0  --  5.2  NEUTROABS 1.5*  --   --   HGB 16.2* 17.0* 15.2*  HCT 51.8* 50.0* 47.2*  MCV 94.0  --  92.7  PLT 150  --  149*   Basic Metabolic Panel:  Recent Labs  Lab 06/16/22 1225 06/16/22 1230 06/17/22 0227  NA 137 137 137  K 3.6 3.7 3.8  CL 102 105 103  CO2 22  --  27  GLUCOSE 182* 172* 149*  BUN CREATININE 1.13* 1.00 1.25*  CALCIUM 9.5  --  9.7   Lipid Panel:  Recent Labs  Lab 06/17/22 0227  CHOL 204*  TRIG 117  HDL 44  CHOLHDL 4.6  VLDL 23  LDLCALC 161*   HgbA1c:  Recent Labs  Lab 06/17/22 0227  HGBA1C 7.3*   Urine Drug Screen:  Recent Labs  Lab 06/16/22 1507  LABOPIA NONE DETECTED  COCAINSCRNUR NONE DETECTED  LABBENZ NONE DETECTED  AMPHETMU NONE DETECTED  THCU NONE DETECTED  LABBARB NONE DETECTED    Alcohol Level  Recent Labs  Lab 06/16/22 1225  ETH <10    IMAGING past 24 hours MR BRAIN WO CONTRAST  Result Date: 06/17/2022 CLINICAL DATA:  89 year old female code stroke presentation yesterday. Right side subdural hematoma. Left side weakness. EXAM: MRI HEAD WITHOUT CONTRAST TECHNIQUE: Multiplanar, multiecho pulse sequences of the brain and surrounding structures were obtained without intravenous contrast. COMPARISON:  CT head, CTA head and neck yesterday. FINDINGS: Brain: Bilateral right greater  than left small volume subdural hematoma. More extensive right side subdural hematoma is 3-4 mm in thickness (series 9, image 14 and series 6, image 26). More localized posterior superior left side subdural hematoma is up to 3 mm in thickness. No midline shift. No significant intracranial mass effect. Basilar cisterns remain normal. No intraventricular hemorrhage or ventriculomegaly. No restricted diffusion to suggest acute infarction. Cervicomedullary junction and pituitary are within normal limits. Patchy and scattered bilateral cerebral white matter T2 and FLAIR hyperintensity, mild to moderate for age. No cortical encephalomalacia or definite chronic cerebral blood products. Deep gray matter nuclei, brainstem and cerebellum are normal for age. Vascular: Major intracranial vascular flow voids are preserved with dominant appearing distal right vertebral artery. Skull and upper cervical spine: Negative for age visible cervical spine. Visualized bone marrow signal is within normal limits. Sinuses/Orbits: Postoperative changes to both globes, otherwise negative orbits. Paranasal Visualized paranasal sinuses and mastoids are stable and well aerated. Other: Grossly normal visible internal auditory structures. No discrete scalp hematoma. IMPRESSION: 1. Positive for small Bilateral Subdural Hematomas, right side (3-4 mm) more extensive than left (up to 3 mm). No significant intracranial mass effect. 2. Negative for acute infarct and no  other acute intracranial abnormality. Mild to moderate for age cerebral white matter signal changes most commonly due to chronic small vessel disease. Electronically Signed   By: Odessa Fleming M.D.   On: 06/17/2022 06:15   DG Knee Complete 4 Views Left  Result Date: 06/16/2022 CLINICAL DATA:  Fall EXAM: LEFT KNEE - COMPLETE 4 VIEW COMPARISON:  None Available. FINDINGS: No evidence of fracture, dislocation, or joint effusion. Calcific density within the joint space could be seen in the  setting of CPPD arthropathy. Vascular calcifications. Suprapatellar enthesophyte. IMPRESSION: 1. No fracture or dislocation. 2. Findings usggestive of CPPD arthropathy. Electronically Signed   By: Lorenza Cambridge M.D.   On: 06/16/2022 16:10   CT ANGIO HEAD NECK W WO CM (CODE STROKE)  Result Date: 06/16/2022 CLINICAL DATA:  Neuro deficit, acute, stroke suspected. EXAM: CT ANGIOGRAPHY HEAD AND NECK WITH AND WITHOUT CONTRAST TECHNIQUE: Multidetector CT imaging of the head and neck was performed using the standard protocol during bolus administration of intravenous contrast. Multiplanar CT image reconstructions and MIPs were obtained to evaluate the vascular anatomy. Carotid stenosis measurements (when applicable) are obtained utilizing NASCET criteria, using the distal internal carotid diameter as the denominator. RADIATION DOSE REDUCTION: This exam was performed according to the departmental dose-optimization program which includes automated exposure control, adjustment of the mA and/or kV according to patient size and/or use of iterative reconstruction technique. CONTRAST:  75mL OMNIPAQUE IOHEXOL 350 MG/ML SOLN COMPARISON:  Head CT 06/16/2022. FINDINGS: CTA NECK FINDINGS Aortic arch: Incompletely imaged. Visualized arch vessel origins are patent. Right carotid system: Irregular narrowing of the mid and distal right cervical ICA, consistent with fibromuscular dysplasia. Left carotid system: Irregular narrowing of the mid and distal left cervical ICA, consistent with fibromuscular dysplasia. Vertebral arteries: Right dominant vertebral artery. Both vessels are patent to the skull base. Skeleton: Extensive degenerative changes of the cervical spine with at least moderate stenosis at C5-6. Other neck: Unremarkable. Upper chest: Unremarkable. Review of the MIP images confirms the above findings CTA HEAD FINDINGS Anterior circulation: Intracranial ICAs are patent without stenosis or aneurysm. The proximal ACAs and MCAs  are patent without stenosis or aneurysm. Distal branches are symmetric. Posterior circulation: The left vertebral artery functionally terminates in PICA. The right vertebral artery and basilar arteries are patent without stenosis or aneurysm. The SCAs, AICAs and PICAs are patent proximally. The PCAs are patent proximally without stenosis or aneurysm. Distal branches are symmetric. Venous sinuses: As permitted by contrast timing, patent. Anatomic variants: Persistent fetal origin of the left PCA with hypoplastic left P1 segment. Review of the MIP images confirms the above findings IMPRESSION: 1. No large vessel occlusion or significant stenosis of the head or neck vessels. 2. 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 Electronically Signed   By: Orvan Falconer M.D.   On: 06/16/2022 13:06   CT HEAD CODE STROKE WO CONTRAST`  Result Date: 06/16/2022 CLINICAL DATA:  Code stroke. Neuro deficit with acute stroke suspected EXAM: CT HEAD WITHOUT CONTRAST TECHNIQUE: Contiguous axial images were obtained from the base of the skull through the vertex without intravenous contrast. RADIATION DOSE REDUCTION: This exam was performed according to the departmental dose-optimization program which includes automated exposure control, adjustment of the mA and/or kV according to patient size and/or use of iterative reconstruction technique. COMPARISON:  11/21/2020 FINDINGS: Brain: Thin isodense subdural collection along the bilateral cerebral convexity measuring up to 4 mm thickness on the right and 3 mm in the left, see coronal reformats. Chronic small vessel ischemia  in the cerebral white matter. Generalized cerebral volume loss. Vascular: No hyperdense vessel or unexpected calcification. Skull: Normal. Negative for fracture or focal lesion. Sinuses/Orbits: No acute finding. Other: These results were called by telephone at the time of interpretation on 06/16/2022 at 12:40 pm to provider ERIC [facility] , who  verbally acknowledged these results. ASPECTS [facility] Stroke Program Early CT Score) Not scored given the above. IMPRESSION: 1. Nonacute/isodense subdural hematoma along the bilateral cerebral convexity measuring up to 4 mm in thickness on the right. 2. Chronic small vessel ischemia and atrophy. Electronically Signed   By: Tiburcio Pea M.D.   On: 06/16/2022 12:46    PHYSICAL EXAM  Temp:  [97.7 F (36.5 C)-98.5 F (36.9 C)] 98 F (36.7 C) (04/17 1200) Pulse Rate:  [44-98] 98 (04/17 1045) Resp:  [10-24] 24 (04/17 1045) BP: (118-186)/(66-135) 133/79 (04/17 1045) SpO2:  [85 %-98 %] 90 % (04/17 1045) Weight:  [60.4 kg] 60.4 kg (04/17 0800)  General - Well nourished, well developed, in no apparent distress. Cardiovascular - Regular rhythm and rate.  Mental Status -  Awake, alert, eyes open, orientated to age, place, time. No aphasia, fluent language, following all simple commands. Able to name and repeat   Cranial Nerves II - XII - II - Visual field intact OU. III, IV, VI - Extraocular movements intact. V - Facial sensation intact bilaterally. VII - Facial movement intact bilaterally. VIII - Hearing & vestibular intact bilaterally. X - Palate elevates symmetrically. XI - Chin turning & shoulder shrug intact bilaterally. XII - Tongue protrusion intact.  Motor Strength -  BUE: 4/5, no drift.  BLE: 4-/5, no drift.    Sensory - Light touch, temperature/pinprick were assessed and were symmetrical.    Coordination - B/L FTN intact. Tremor was absent.  Gait and Station - deferred.   ASSESSMENT/PLAN Ms. SHEMEKIA PATANE is a 89 y.o. female with history of PMHx of hypercalcemia, arthritis, HLD, HTN and sleep apnea who was BIB EMS as a Code Stroke after acute onset of left sided weakness at home. She suddenly felt "swimmy headed" and fell to the floor. She called out for a family member who noted the patient was weak on the left. EMS was called and on their arrival she was almost  completely flaccid on the left and unable to speak. Initial BP was 260/110 which improved to 214/100 en route. Her left sided weakness started to improve during transport to the ED and speech also started to return. Exam in ED showed rapidly improving deficits.   Possible seizure given bilateral SDH. TIA is still in DDx Bilateral subacute SDH, R>L Code Stroke CT head: Nonacute/isodense subdural hematoma along the bilateral cerebral convexity measuring up to 4 mm in thickness on the right. Chronic small vessel ischemia and atrophy. CTA head & neck: No large vessel occlusion or significant stenosis of the head or neck vessels. MRI: Negative for acute infarct and no other acute intracranial abnormality. Positive for small Bilateral Subdural Hematomas, right side (3-4 mm) more extensive than left (up to 3 mm). No significant intracranial mass effect. 2D Echo: pending EEG: negative LDL 137 HgbA1c 7.3 UDS negative VTE prophylaxis -heparin subcu No antithrombotic prior to admission, now on No antithrombotic due to subdural hematomas.  Add low-dose Keppra 250 twice daily Therapy recommendations: Home health OT Disposition:  pending  Hypertension Home meds:   lisinopril 20mg , amlodipine 10mg , HCTZ 12.5 SBP goal <160 Lisinopril increased to 40mg  HCTZ increased to 25 Continue home amlodipine 10 Long-term  BP goal normotensive  Hyperlipidemia Home meds:  none LDL 137, goal < 70 On Crestor 10 High intensity statin not indicated given advanced age Continue statin on discharge  Diabetes type II, new diagnosis HgbA1c 7.3, goal < 7.0 CBGs SSI Close outpatient PCP follow-up  Other Stroke Risk Factors Advanced Age >/= 65   Hx stroke/TIA Obstructive sleep apnea  Other acute issues History of hypercalcemia AKI creatinine 1.13-1.00-1.25, close monitoring Polycythemia, hemoglobin 16.2-17.0  Hospital day # 1   Pt seen by Neuro NP/APP and later by MD. Note/plan to be edited by MD as needed.     Lynnae January, DNP, AGACNP-BC [facility] [phone] for Stroke APP   ATTENDING NOTE: I reviewed above note and agree with the assessment and plan. Pt was seen and examined.   No family at bedside.  Patient sitting in chair, awake, alert, eyes open, orientated to age, place, time. No aphasia, fluent language, following all simple commands. Able to name and repeat. No gaze palsy, tracking bilaterally, visual field full, PERRL. No facial droop. Tongue midline. Bilateral UEs 4/5, no drift. Bilaterally LEs 3+/5, no drift. Sensation symmetrical bilaterally, b/l FTN intact, gait not tested.   Etiology for patient's symptoms not for certain, however concerning for seizure from subacute subdural hematoma.  Put on low-dose Keppra.  EEG within normal limits.  TIA still within DDx.  BP still elevated, on Cleviprex, wean off as able, increase home HCTZ and lisinopril continue Norvasc.  Creatinine mildly elevated, encourage p.o. intake, close monitoring.  LDL elevated, low-dose Crestor.  PT/OT recommend home health OT.  For detailed assessment and plan, please refer to above/below as I have made changes wherever appropriate.   Marvel Plan, MD PhD Stroke Neurology 06/17/2022 5:54 PM  This patient is critically ill due to subdural hematoma, possible seizure, hypertensive emergency and at significant risk of neurological worsening, death form status epilepticus, worsening subdural hematoma, hypertensive encephalopathy. This patient's care requires constant monitoring of vital signs, hemodynamics, respiratory and cardiac monitoring, review of multiple databases, neurological assessment, discussion with family, other specialists and medical decision making of high complexity. I spent 40 minutes of neurocritical care time in the care of this patient.    To contact Stroke Continuity provider, please refer to [email or web address]. After hours, contact General Neurology

## 2022-06-17 NOTE — Evaluation (Signed)
Physical Therapy Evaluation Patient Details Name: Amber Bolton MRN: [id number] DOB: [birth year] Today's Date: 06/17/2022  History of Present Illness  89 y.o. female presents to ED on 4/16 via EMS as a Code Stroke after acute onset of left sided weakness at home, resolved in transit. Pt found to have chronic R subdural hematoma. Suspect TIA PMHx of hypercalcemia, arthritis, HLD, HTN and sleep apnea   Clinical Impression  Pt very pleasant and cooperative. L sided weakness and dizziness has resolved and pt near baseline level of function. Ambulation limited today by elevated systolic BP in 170s with goal being <160. Pt with good home set up and support. Pt requiring minA for bathing and use RW at baseline. Suspect once BP within target range pt will progress well and be safe to return home with 24/7 assist from family.       Recommendations for follow up therapy are one component of a multi-disciplinary discharge planning process, led by the attending physician.  Recommendations may be updated based on patient status, additional functional criteria and insurance authorization.  Follow Up Recommendations       Assistance Recommended at Discharge Frequent or constant Supervision/Assistance  Patient can return home with the following       Equipment Recommendations None recommended by PT  Recommendations for Other Services       Functional Status Assessment Patient has had a recent decline in their functional status and demonstrates the ability to make significant improvements in function in a reasonable and predictable amount of time.     Precautions / Restrictions Precautions Precautions: Fall;Other (comment) Precaution Comments: keep Systolic BP <160 Restrictions Weight Bearing Restrictions: No      Mobility  Bed Mobility Overal bed mobility: Needs Assistance Bed Mobility: Supine to Sit     Supine to sit: Min guard     General bed mobility comments: HOB elevated,  increased time, min guard for safety due to elevated bed height    Transfers Overall transfer level: Needs assistance Equipment used: 1 person hand held assist Transfers: Sit to/from Stand, Bed to chair/wheelchair/BSC Sit to Stand: Min assist   Step pivot transfers: Min assist       General transfer comment: minA to power up and then steady during step transfer to [facility], pt used L UE on handrail of BSC and then this PT provided R HHA, pt then used RW with min guard assist for safety and line management to complete step pvt to recliner from [facility]    Ambulation/Gait               General Gait Details: unable to ambulate this date due to systolic BP 174, RN started IV BP meds, will attempt amb when appropriate/able  Stairs            Wheelchair Mobility    Modified Rankin (Stroke Patients Only) Modified Rankin (Stroke Patients Only) Pre-Morbid Rankin Score: Moderate disability Modified Rankin: Moderate disability     Balance Overall balance assessment: Needs assistance Sitting-balance support: Feet supported, No upper extremity supported Sitting balance-Leahy Scale: Fair     Standing balance support: Reliant on assistive device for balance, During functional activity, Bilateral upper extremity supported Standing balance-Leahy Scale: Fair Standing balance comment: pt reliants on RW for ambulation and [place] HHA for sit to stand                             Pertinent Vitals/Pain Pain Assessment Pain  Assessment: No/denies pain    Home Living Family/patient expects to be discharged to:: Private residence Living Arrangements: Children Available Help at Discharge: Family;Available PRN/intermittently (can provide 24/7 if needed) Type of Home: House Home Access: Level entry       Home Layout: One level Home Equipment: Agricultural consultant (2 wheels);BSC/3in1      Prior Function Prior Level of Function : Needs assist             Mobility Comments:  uses RW, dtr helps with transfers in/out of tub ADLs Comments: pt does majority of dressing/bathing, dtr/son fixes meals, pt able to feed self     Hand Dominance   Dominant Hand: Right    Extremity/Trunk Assessment   Upper Extremity Assessment Upper Extremity Assessment: Generalized weakness    Lower Extremity Assessment Lower Extremity Assessment: Generalized weakness    Cervical / Trunk Assessment Cervical / Trunk Assessment: Normal  Communication   Communication: HOH  Cognition Arousal/Alertness: Awake/alert Behavior During Therapy: WFL for tasks assessed/performed Overall Cognitive Status: Within Functional Limits for tasks assessed                                 General Comments: HOH causing increased response time        General Comments General comments (skin integrity, edema, etc.): pt with 2 small abrasion on L knee from fall during TIA incident at home, BP remains elevated however RN started IV pain meds, assisted to [facility], minA to complete hygiene    Exercises     Assessment/Plan    PT Assessment Patient needs continued PT services  PT Problem List Decreased range of motion;Decreased strength;Decreased activity tolerance;Decreased balance;Decreased mobility       PT Treatment Interventions DME instruction;Gait training;Functional mobility training;Therapeutic activities;Therapeutic exercise;Balance training;Neuromuscular re-education    PT Goals (Current goals can be found in the Care Plan section)  Acute Rehab PT Goals Patient Stated Goal: home PT Goal Formulation: With patient Time For Goal Achievement: 07/01/22 Potential to Achieve Goals: Good    Frequency Min 4X/week     Co-evaluation               AM-PAC PT "6 Clicks" Mobility  Outcome Measure Help needed turning from your back to your side while in a flat bed without using bedrails?: None Help needed moving from lying on your back to sitting on the side of a flat bed  without using bedrails?: None Help needed moving to and from a bed to a chair (including a wheelchair)?: A Little Help needed standing up from a chair using your arms (e.g., wheelchair or bedside chair)?: A Little Help needed to walk in hospital room?: A Little Help needed climbing 3-5 steps with a railing? : A Little 6 Click Score: 20    End of Session   Activity Tolerance: Patient tolerated treatment well Patient left: in chair;with call bell/phone within reach (OT present) Nurse Communication: Mobility status PT Visit Diagnosis: Unsteadiness on feet (R26.81);Muscle weakness (generalized) (M62.81);Difficulty in walking, not elsewhere classified (R26.2)    Time: 1610-9604 PT Time Calculation (min) (ACUTE ONLY): 40 min   Charges:   PT Evaluation $PT Eval Moderate Complexity: 1 Mod PT Treatments $Therapeutic Activity: 23-37 mins        Lewis Shock, PT, DPT Acute Rehabilitation Services Secure chat preferred Office #: [phone]   Iona Hansen 06/17/2022, 9:42 AM

## 2022-06-17 NOTE — Evaluation (Signed)
Occupational Therapy Evaluation Patient Details Name: Amber Bolton MRN: [id number] DOB: [birth year] Today's Date: 06/17/2022   History of Present Illness 89 y.o. female presents to ED on 4/16 via EMS as a Code Stroke after acute onset of left sided weakness at home, resolved in transit. 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 Suspect TIA PMHx of hypercalcemia, arthritis, HLD, HTN and sleep apnea   Clinical Impression   Pt currently min to min guard for simulated selfcare tasks sit to stand and functional transfers with use of the RW for support BP slightly elevated at 132/105 and 129/117 in sitting.  HR at 96-105 BPM with O2 at 96% on room air.  Pt has assist at home from her son, who lives with her and then the daughter who comes in frequently.  Feel she will benefit from acute care OT at this time to help progress back to supervision/modified independent baseline with use of the RW.  Will continue to follow.        Recommendations for follow up therapy are one component of a multi-disciplinary discharge planning process, led by the attending physician.  Recommendations may be updated based on patient status, additional functional criteria and insurance authorization.   Assistance Recommended at Discharge Frequent or constant Supervision/Assistance  Patient can return home with the following A little help with bathing/dressing/bathroom;Assistance with cooking/housework;Assist for transportation;Help with stairs or ramp for entrance    Functional Status Assessment  Patient has had a recent decline in their functional status and demonstrates the ability to make significant improvements in function in a reasonable and predictable amount of time.  Equipment Recommendations  Tub/shower bench;Other (comment) (family may purchase from outside source)       Precautions / Restrictions Precautions Precautions: Fall;Other (comment) Precaution Comments: keep Systolic BP  <160 Restrictions Weight Bearing Restrictions: No      Mobility Bed Mobility                    Transfers Overall transfer level: Needs assistance Equipment used: 1 person hand held assist Transfers: Sit to/from Stand, Bed to chair/wheelchair/BSC Sit to Stand: Min assist     Step pivot transfers: Min assist     General transfer comment: Pt needed min instructional cueing for hand placement with sit to stand from the recliner.      Balance Overall balance assessment: Needs assistance Sitting-balance support: Feet supported Sitting balance-Leahy Scale: Fair     Standing balance support: Reliant on assistive device for balance, During functional activity, Bilateral upper extremity supported Standing balance-Leahy Scale: Poor Standing balance comment: Pt needs UE support for balance with mobility                           ADL either performed or assessed with clinical judgement   ADL Overall ADL's : Needs assistance/impaired Eating/Feeding: Independent;Sitting   Grooming: Wash/dry hands;Wash/dry face;Sitting;Set up   Upper Body Bathing: Supervision/ safety;Sitting Upper Body Bathing Details (indicate cue type and reason): simulated Lower Body Bathing: Sit to/from stand;Min guard Lower Body Bathing Details (indicate cue type and reason): simulated     Lower Body Dressing: Sit to/from stand;Minimal assistance Lower Body Dressing Details (indicate cue type and reason): simulated Toilet Transfer: Min guard;Stand-pivot;Rolling walker (2 wheels) Toilet Transfer Details (indicate cue type and reason): simulated Toileting- Clothing Manipulation and Hygiene: Minimal assistance;Sit to/from stand Toileting - Clothing Manipulation Details (indicate cue type and reason): simulated to bedside chair  Functional mobility during ADLs: Minimal assistance General ADL Comments: Pt has son that lives with her and her daughter comes in to assist with preparing some  meals.  She also has meals on wheels and heats them up with the microwave.  Discussed benefit and need for a tub bench which patient will discuss with family.     Vision Baseline Vision/History: 1 Wears glasses Ability to See in Adequate Light: 0 Adequate Patient Visual Report: No change from baseline Vision Assessment?: No apparent visual deficits     Perception  WFL   Praxis  [facility]    Pertinent Vitals/Pain Pain Assessment Pain Assessment: Faces Faces Pain Scale: No hurt     Hand Dominance Right   Extremity/Trunk Assessment Upper Extremity Assessment Upper Extremity Assessment: Generalized weakness (strength 4/5 in shoulders and elbows.  Arthritic changes in digits with grip 4/5 on the right and 3+/5 on the left.  Decreased FM bilaterally)   Lower Extremity Assessment Lower Extremity Assessment: Defer to PT evaluation   Cervical / Trunk Assessment Cervical / Trunk Assessment: Normal   Communication Communication Communication: HOH   Cognition Arousal/Alertness: Awake/alert Behavior During Therapy: WFL for tasks assessed/performed Overall Cognitive Status: Within Functional Limits for tasks assessed                                 General Comments: Pt oriented to place and situation.  Not aware of hx of B SDH but MD may not have told her yet.     General Comments  pt with 2 small abrasion on L knee from fall during TIA incident at home, BP remains elevated however RN started IV pain meds, assisted to [facility], minA to complete hygiene            Home Living Family/patient expects to be discharged to:: Private residence Living Arrangements: Children Available Help at Discharge: Family;Available PRN/intermittently (can provide 24/7 if needed) Type of Home: House Home Access: Level entry     Home Layout: One level     Bathroom Shower/Tub: Chief Strategy Officer: Handicapped height     Home Equipment: Agricultural consultant (2  wheels);BSC/3in1;Shower seat          Prior Functioning/Environment Prior Level of Function : Needs assist             Mobility Comments: uses RW, dtr helps with transfers in/out of tub ADLs Comments: pt does majority of dressing/bathing they help with getting in and out of the shower, dtr/son fixes meals, pt able to feed self        OT Problem List: Decreased strength;Impaired balance (sitting and/or standing);Decreased coordination;Decreased knowledge of use of DME or AE      OT Treatment/Interventions: Self-care/ADL training;Patient/family education;Balance training;Therapeutic activities;DME and/or AE instruction;Neuromuscular education    OT Goals(Current goals can be found in the care plan section) Acute Rehab OT Goals Patient Stated Goal: Pt did not state but agreeable to therapy OT Goal Formulation: With patient Time For Goal Achievement: 07/01/22 Potential to Achieve Goals: Good  OT Frequency: Min 2X/week       AM-PAC OT "6 Clicks" Daily Activity     Outcome Measure Help from another person eating meals?: None Help from another person taking care of personal grooming?: A Little Help from another person toileting, which includes using toliet, bedpan, or urinal?: A Little Help from another person bathing (including washing, rinsing, drying)?: A Little Help from another  person to put on and taking off regular upper body clothing?: A Little Help from another person to put on and taking off regular lower body clothing?: A Little 6 Click Score: 19   End of Session Equipment Utilized During Treatment: Rolling walker (2 wheels) Nurse Communication: Mobility status  Activity Tolerance: Patient tolerated treatment well Patient left: in chair;with call bell/phone within reach  OT Visit Diagnosis: Unsteadiness on feet (R26.81);Muscle weakness (generalized) (M62.81)                Time: 1610-9604 OT Time Calculation (min): 32 min Charges:  OT General Charges $OT  Visit: 1 Visit OT Evaluation $OT Eval Moderate Complexity: 1 Mod OT Treatments $Self Care/Home Management : 8-22 mins  Perrin Maltese, OTR/L Acute Rehabilitation Services  Office [phone] 06/17/2022

## 2022-06-17 NOTE — Evaluation (Signed)
Speech Language Pathology Evaluation Patient Details Name: Amber Bolton MRN: [id number] DOB: 1929/06/07 Today's Date: 06/17/2022 Time: 4098-1191 SLP Time Calculation (min) (ACUTE ONLY): 34 min  Problem List:  Patient Active Problem List   Diagnosis Date Noted   Subdural hematoma 06/16/2022   Resistant hypertension 03/09/2022   Expected blood loss anemia 08/24/2012   Obesity (BMI 30-39.9) 08/24/2012   S/P right THA, AA 08/23/2012   Past Medical History:  Past Medical History:  Diagnosis Date   Arthritis    IN RIGHT HIP AND FINGERS AND BACK   Hypercalcemia    Hyperlipemia    Resistant hypertension 03/09/2022   Sleep apnea    STATES SHE HAS CPAP - BUT HAS NOT USED IN THE LAST 2 YRS   Past Surgical History:  Past Surgical History:  Procedure Laterality Date   ABDOMINAL HYSTERECTOMY  1978   HEMORRHOID SURGERY  1960'S   TOTAL HIP ARTHROPLASTY Right 08/23/2012   Procedure: RIGHT TOTAL HIP ARTHROPLASTY ANTERIOR APPROACH;  Surgeon: [name], MD;  Location: [facility];  Service: Orthopedics;  Laterality: Right;   HPI:  Pt is a 89 y.o. female who presented via EMS with acute onset of left sided weakness. MRI brain 4/17: Positive for small bilateral subdural hematomas, right side (3-4 mm) more extensive than left (up to 3 mm). PMH: hypercalcemia, arthritis, HLD, HTN and sleep apnea.   Assessment / Plan / Recommendation Clinical Impression  Pt participated in speech-language-cognition evaluation with her daughter present for the latter part of the evaluation. 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 She denied any baseline or acute deficits in speech, language, or cognition. Pt's daughter stated that the pt has been demonstrating difficulty with memory and that she has been managing the pt's medications and finances. [facility] Mental Status Examination was completed to evaluate the pt's cognitive-linguistic skills. She achieved a score of 17/30  which is below the normal limits of 27 or more out of 30. She exhibited difficulty in the areas of attention, memory and executive function as it relates to awareness and complex problem solving. However, pt's daughter indicated that she believed the pt's performance is at baseline and it is certainly functional considering the level of support she currently has at home. Motor speech and language skills were [facility]. Further acute skilled SLP services are not clinically indicated at this time. Pt and her daughter were educated regarding results and recommendations; both parties verbalized understanding as well as agreement with plan of care.    SLP Assessment  SLP Recommendation/Assessment: Patient does not need any further Speech Lanaguage Pathology Services SLP Visit Diagnosis: Cognitive communication deficit (R41.841)    Recommendations for follow up therapy are one component of a multi-disciplinary discharge planning process, led by the attending physician.  Recommendations may be updated based on patient status, additional functional criteria and insurance authorization.    Follow Up Recommendations  No SLP follow up    Assistance Recommended at Discharge  Intermittent Supervision/Assistance  Functional Status Assessment Patient has not had a recent decline in their functional status  Frequency and Duration           SLP Evaluation Cognition  Overall Cognitive Status: History of cognitive impairments - at baseline Arousal/Alertness: Awake/alert Orientation Level: Oriented X4 Year: 2024 Month: April Day of Week: Correct Attention: Focused;Sustained Focused Attention: Appears intact Sustained Attention: Impaired Sustained Attention Impairment: Verbal complex Memory: Impaired Memory Impairment:  (Immediate: 5/5 with reptition x6; delayed:  4/5; with cue: 0/1) Awareness: Impaired Awareness Impairment: Intellectual impairment Problem Solving: Impaired Problem Solving Impairment: Verbal  complex (money: 1/3; time) Executive Function: Sequencing;Organizing Sequencing: Impaired Sequencing Impairment: Verbal complex (clock: 2/4) Organizing:  (backward digit span: 1/2; 2/2 with repetition)       Comprehension  Auditory Comprehension Overall Auditory Comprehension: Appears within functional limits for tasks assessed Yes/No Questions: Within Functional Limits Commands: Within Functional Limits Conversation: Complex    Expression Expression Primary Mode of Expression: Verbal Verbal Expression Overall Verbal Expression: Appears within functional limits for tasks assessed Initiation: No impairment Level of Generative/Spontaneous Verbalization: Conversation Repetition: No impairment Naming: No impairment Pragmatics: No impairment Written Expression Dominant Hand: Right   Oral / Motor  Motor Speech Overall Motor Speech: Appears within functional limits for tasks assessed Respiration: Within functional limits Phonation: Normal Resonance: Within functional limits Articulation: Within functional limitis Intelligibility: Intelligible Motor Planning: Witnin functional limits Motor Speech Errors: Not applicable           Amber Bolton Clock, MS, CCC-SLP Acute Rehabilitation Services Office number [phone]  Amber Bolton 06/17/2022, 1:02 PM

## 2022-06-17 NOTE — Progress Notes (Signed)
Clarified SBP goals with Dr. Wilford Corner. SBP less than 180 is goal.

## 2022-06-18 ENCOUNTER — Inpatient Hospital Stay (HOSPITAL_COMMUNITY): Payer: Medicare Other

## 2022-06-18 DIAGNOSIS — S065XAA Traumatic subdural hemorrhage with loss of consciousness status unknown, initial encounter: Secondary | ICD-10-CM | POA: Diagnosis not present

## 2022-06-18 DIAGNOSIS — I6389 Other cerebral infarction: Secondary | ICD-10-CM | POA: Diagnosis not present

## 2022-06-18 DIAGNOSIS — R569 Unspecified convulsions: Secondary | ICD-10-CM | POA: Diagnosis not present

## 2022-06-18 LAB — BASIC METABOLIC PANEL
Anion gap: 10 (ref 5–15)
BUN: 24 mg/dL — ABNORMAL HIGH (ref 8–23)
CO2: 25 mmol/L (ref 22–32)
Calcium: 9.6 mg/dL (ref 8.9–10.3)
Chloride: 101 mmol/L (ref 98–111)
Creatinine, Ser: 1.44 mg/dL — ABNORMAL HIGH (ref 0.44–1.00)
GFR, Estimated: 35 mL/min — ABNORMAL LOW (ref >60)
Glucose, Bld: 121 mg/dL — ABNORMAL HIGH (ref 70–99)
Potassium: 3.6 mmol/L (ref 3.5–5.1)
Sodium: 136 mmol/L (ref 135–145)

## 2022-06-18 LAB — CBC
HCT: 46.9 % — ABNORMAL HIGH (ref 36.0–46.0)
Hemoglobin: 14.6 g/dL (ref 12.0–15.0)
MCH: 29.6 pg (ref 26.0–34.0)
MCHC: 31.1 g/dL (ref 30.0–36.0)
MCV: 95.1 fL (ref 80.0–100.0)
Platelets: 154 10*3/uL (ref 150–400)
RBC: 4.93 MIL/uL (ref 3.87–5.11)
RDW: 12.1 % (ref 11.5–15.5)
WBC: 4.8 10*3/uL (ref 4.0–10.5)
nRBC: 0 % (ref 0.0–0.2)

## 2022-06-18 LAB — ECHOCARDIOGRAM COMPLETE
AR max vel: 3.4 cm2
AV Area VTI: 4.02 cm2
AV Area mean vel: 3.66 cm2
AV Mean grad: 2 mmHg
AV Peak grad: 4.7 mmHg
Ao pk vel: 1.08 m/s
Area-P 1/2: 4.36 cm2
Calc EF: 33.6 %
Height: 63 in
S' Lateral: 2.8 cm
Single Plane A2C EF: 34 %
Single Plane A4C EF: 37.2 %
Weight: 2130.53 oz

## 2022-06-18 MED ORDER — LISINOPRIL 20 MG PO TABS: 40.0000 mg | ORAL_TABLET | Freq: Every day | ORAL | Status: DC

## 2022-06-18 MED ORDER — LEVETIRACETAM 250 MG PO TABS: 250.0000 mg | ORAL_TABLET | Freq: Two times a day (BID) | ORAL | 0 refills | Status: DC

## 2022-06-18 MED ORDER — LISINOPRIL 40 MG PO TABS: 40.0000 mg | ORAL_TABLET | Freq: Every day | ORAL | 0 refills | Status: DC

## 2022-06-18 MED ORDER — HYDROCHLOROTHIAZIDE 25 MG PO TABS: 25.0000 mg | ORAL_TABLET | Freq: Every day | ORAL | 0 refills | Status: DC

## 2022-06-18 MED ORDER — ROSUVASTATIN CALCIUM 10 MG PO TABS: 10.0000 mg | ORAL_TABLET | Freq: Every day | ORAL | 0 refills | Status: DC

## 2022-06-18 NOTE — Progress Notes (Signed)
Physical Therapy Treatment Patient Details Name: Amber Bolton MRN: [id number] DOB: 04-16-1929 Today's Date: 06/18/2022   History of Present Illness 89 y.o. female presents to ED on 4/16 via EMS as a Code Stroke after acute onset of left sided weakness at home, resolved in transit. Pt found to have chronic R subdural hematoma. Suspect TIA PMHx of hypercalcemia, arthritis, HLD, HTN and sleep apnea    PT Comments    Pt was able to progress to ambulating in the hallway with a RW at a min guard assist level without LOB. Educated pt's daughter on how to guard pt and provided her with a gait belt for home use. Pt appears to be close to her baseline. Will continue to follow acutely.   Recommendations for follow up therapy are one component of a multi-disciplinary discharge planning process, led by the attending physician.  Recommendations may be updated based on patient status, additional functional criteria and insurance authorization.  Follow Up Recommendations       Assistance Recommended at Discharge Frequent or constant Supervision/Assistance  Patient can return home with the following A little help with bathing/dressing/bathroom;Assistance with cooking/housework;Assist for transportation;A little help with walking and/or transfers;Direct supervision/assist for medications management;Direct supervision/assist for financial management   Equipment Recommendations  None recommended by PT    Recommendations for Other Services       Precautions / Restrictions Precautions Precautions: Fall;Other (comment) Precaution Comments: keep Systolic BP <160 Restrictions Weight Bearing Restrictions: No     Mobility  Bed Mobility Overal bed mobility: Needs Assistance Bed Mobility: Supine to Sit, Sit to Supine     Supine to sit: Min guard, HOB elevated Sit to supine: Min guard, HOB elevated   General bed mobility comments: HOB elevated, increased time, min guard for safety     Transfers Overall transfer level: Needs assistance Equipment used: Rolling walker (2 wheels) Transfers: Sit to/from Stand Sit to Stand: Min guard           General transfer comment: Extra time to power up to stand, min guard for safety, 1x from EOB and 1x from toilet    Ambulation/Gait Ambulation/Gait assistance: Min guard Gait Distance (Feet): 140 Feet (x2 bouts of ~20 ft > ~140 ft) Assistive device: Rolling walker (2 wheels) Gait Pattern/deviations: Step-through pattern, Decreased stride length, Trunk flexed Gait velocity: reduced Gait velocity interpretation: <1.8 ft/sec, indicate of risk for recurrent falls   General Gait Details: Pt takes slow, small steps with a flexed posture, but no overt LOB. MIn guard for safety. Cues to remain proximal to RW   Stairs             Wheelchair Mobility    Modified Rankin (Stroke Patients Only) Modified Rankin (Stroke Patients Only) Pre-Morbid Rankin Score: Moderate disability Modified Rankin: Moderate disability     Balance Overall balance assessment: Needs assistance Sitting-balance support: Feet supported, No upper extremity supported Sitting balance-Leahy Scale: Fair     Standing balance support: Reliant on assistive device for balance, During functional activity, Bilateral upper extremity supported, No upper extremity supported Standing balance-Leahy Scale: Fair Standing balance comment: Able to wash hands at sink, no UE support, reliant on RW to ambulate                            Cognition Arousal/Alertness: Awake/alert Behavior During Therapy: WFL for tasks assessed/performed Overall Cognitive Status: Within Functional Limits for tasks assessed  General Comments: HOH causing increased response time        Exercises      General Comments General comments (skin integrity, edema, etc.): daughter present, educated her on guarding pt and provided  gait belt for home use      Pertinent Vitals/Pain Pain Assessment Pain Assessment: Faces Faces Pain Scale: No hurt Pain Intervention(s): Monitored during session    Home Living                          Prior Function            PT Goals (current goals can now be found in the care plan section) Acute Rehab PT Goals Patient Stated Goal: home PT Goal Formulation: With patient/family Time For Goal Achievement: 07/01/22 Potential to Achieve Goals: Good Progress towards PT goals: Progressing toward goals    Frequency    Min 4X/week      PT Plan Current plan remains appropriate    Co-evaluation              AM-PAC PT "6 Clicks" Mobility   Outcome Measure  Help needed turning from your back to your side while in a flat bed without using bedrails?: None Help needed moving from lying on your back to sitting on the side of a flat bed without using bedrails?: None Help needed moving to and from a bed to a chair (including a wheelchair)?: A Little Help needed standing up from a chair using your arms (e.g., wheelchair or bedside chair)?: A Little Help needed to walk in hospital room?: A Little Help needed climbing 3-5 steps with a railing? : A Little 6 Click Score: 20    End of Session Equipment Utilized During Treatment: Gait belt Activity Tolerance: Patient tolerated treatment well Patient left: with call bell/phone within reach;in bed;with bed alarm set;with family/visitor present Nurse Communication: Mobility status (NT) PT Visit Diagnosis: Unsteadiness on feet (R26.81);Muscle weakness (generalized) (M62.81);Difficulty in walking, not elsewhere classified (R26.2)     Time: 1422-1450 PT Time Calculation (min) (ACUTE ONLY): 28 min  Charges:  $Gait Training: 8-22 mins $Therapeutic Activity: 8-22 mins                     Raymond Gurney, PT, DPT Acute Rehabilitation Services  Office: [phone]    Amber Bolton 06/18/2022, 3:47 PM

## 2022-06-18 NOTE — Progress Notes (Signed)
  Echocardiogram 2D Echocardiogram has been performed.  Mayuri Staples Wynn Banker 06/18/2022, 9:15 AM

## 2022-06-18 NOTE — Plan of Care (Signed)
  Problem: Education: Goal: Knowledge of disease or condition will improve Outcome: Progressing Goal: Knowledge of secondary prevention will improve (MUST DOCUMENT ALL) Outcome: Progressing Goal: Knowledge of patient specific risk factors will improve (Mark N/A or DELETE if not current risk factor) Outcome: Progressing   Problem: Intracerebral Hemorrhage Tissue Perfusion: Goal: Complications of Intracerebral Hemorrhage will be minimized Outcome: Progressing   Problem: Coping: Goal: Will verbalize positive feelings about self Outcome: Progressing Goal: Will identify appropriate support needs Outcome: Progressing   Problem: Health Behavior/Discharge Planning: Goal: Ability to manage health-related needs will improve Outcome: Progressing Goal: Goals will be collaboratively established with patient/family Outcome: Progressing   Problem: Self-Care: Goal: Ability to participate in self-care as condition permits will improve Outcome: Progressing Goal: Verbalization of feelings and concerns over difficulty with self-care will improve Outcome: Progressing Goal: Ability to communicate needs accurately will improve Outcome: Progressing   Problem: Nutrition: Goal: Risk of aspiration will decrease Outcome: Progressing Goal: Dietary intake will improve Outcome: Progressing   Problem: Education: Goal: Knowledge of General Education information will improve Description: Including pain rating scale, medication(s)/side effects and non-pharmacologic comfort measures Outcome: Progressing   Problem: Health Behavior/Discharge Planning: Goal: Ability to manage health-related needs will improve Outcome: Progressing   Problem: Clinical Measurements: Goal: Ability to maintain clinical measurements within normal limits will improve Outcome: Progressing Goal: Will remain free from infection Outcome: Progressing Goal: Diagnostic test results will improve Outcome: Progressing Goal:  Respiratory complications will improve Outcome: Progressing Goal: Cardiovascular complication will be avoided Outcome: Progressing   Problem: Activity: Goal: Risk for activity intolerance will decrease Outcome: Progressing   Problem: Nutrition: Goal: Adequate nutrition will be maintained Outcome: Progressing   Problem: Coping: Goal: Level of anxiety will decrease Outcome: Progressing   Problem: Elimination: Goal: Will not experience complications related to bowel motility Outcome: Progressing Goal: Will not experience complications related to urinary retention Outcome: Progressing   Problem: Pain Managment: Goal: General experience of comfort will improve Outcome: Progressing   Problem: Safety: Goal: Ability to remain free from injury will improve Outcome: Progressing   Problem: Skin Integrity: Goal: Risk for impaired skin integrity will decrease Outcome: Progressing   

## 2022-06-18 NOTE — TOC Transition Note (Signed)
Transition of Care [facility]) - CM/SW Discharge Note   Patient Details  Name: Amber Bolton MRN: [id number] Date of Birth: February 09, 1930  [facility]) CM/SW Contact:  Kermit Balo, RN Phone Number: 06/18/2022, 1:34 PM   Clinical Narrative:    Pt is discharging home with home health services through [facility]. Cory with [facility] accepted the referral. Information on the AVS. Pt has needed DME at home.  Daughter to provide transport home today.   Final next level of care: Home w Home Health Services Barriers to Discharge: No Barriers Identified   Patient Goals and CMS Choice      Discharge Placement                         Discharge Plan and Services Additional resources added to the After Visit Summary for                            HH Arranged: OT, PT HH Agency: [facility] Date [facility] Agency Contacted: 06/18/22   Representative spoke with at [facility] Agency: Kandee Keen  Social Determinants of Health (SDOH) Interventions SDOH Screenings   Food Insecurity: No Food Insecurity (06/17/2022)  Housing: Low Risk  (06/17/2022)  Transportation Needs: No Transportation Needs (06/17/2022)  Utilities: Not At Risk (06/17/2022)  Tobacco Use: Low Risk  (06/16/2022)     Readmission Risk Interventions     No data to display

## 2022-06-18 NOTE — Discharge Summary (Signed)
Stroke Discharge Summary  Patient ID: Amber Bolton   MRN: [id number]      DOB: 01/09/1930  Date of Admission: 06/16/2022 Date of Discharge: 06/18/2022  Attending Physician:  No att. providers found, Stroke MD Patient's PCP:  [facility]  DISCHARGE DIAGNOSIS:  Bilateral subacute subdural hematoma Possible seizure  Active problems Hypertension Hyperlipidemia Diabetes OSA History of stroke/TIA AKI History of hypercalcemia Polycythemia, resolved   Allergies as of 06/18/2022       Reactions   Penicillins Rash   Shellfish Allergy Itching   Mouth itches        Medication List     TAKE these medications    ALPRAZolam 0.5 MG tablet Commonly known as: XANAX Take 0.25 mg by mouth at bedtime as needed for sleep.   amLODipine 10 MG tablet Commonly known as: NORVASC Take 10 mg by mouth daily.   hydrochlorothiazide 25 MG tablet Commonly known as: HYDRODIURIL Take 1 tablet (25 mg total) by mouth daily. Start taking on: June 19, 2022 What changed:  medication strength how much to take   levETIRAcetam 250 MG tablet Commonly known as: KEPPRA Take 1 tablet (250 mg total) by mouth 2 (two) times daily.   lisinopril 40 MG tablet Commonly known as: ZESTRIL Take 1 tablet (40 mg total) by mouth daily. Start taking on: June 19, 2022 What changed:  medication strength how much to take   ondansetron 4 MG tablet Commonly known as: Zofran Take 1 tablet (4 mg total) by mouth every 8 (eight) hours as needed for nausea or vomiting.   rosuvastatin 10 MG tablet Commonly known as: CRESTOR Take 1 tablet (10 mg total) by mouth daily. Start taking on: June 19, 2022   traMADol 50 MG tablet Commonly known as: ULTRAM Take 50 mg by mouth 2 (two) times daily as needed for moderate pain or severe pain.   triamcinolone cream 0.1 % Commonly known as: KENALOG Apply 1 Application topically daily as needed (rash).        LABORATORY STUDIES CBC     Component Value Date/Time   WBC 4.8 06/18/2022 0321   RBC 4.93 06/18/2022 0321   HGB 14.6 06/18/2022 0321   HCT 46.9 (H) 06/18/2022 0321   PLT 154 06/18/2022 0321   MCV 95.1 06/18/2022 0321   MCH 29.6 06/18/2022 0321   MCHC 31.1 06/18/2022 0321   RDW 12.1 06/18/2022 0321   LYMPHSABS 2.1 06/16/2022 1225   MONOABS 0.3 06/16/2022 1225   EOSABS 0.1 06/16/2022 1225   BASOSABS 0.0 06/16/2022 1225   CMP    Component Value Date/Time   NA 136 06/18/2022 0321   K 3.6 06/18/2022 0321   CL 101 06/18/2022 0321   CO2 25 06/18/2022 0321   GLUCOSE 121 (H) 06/18/2022 0321   BUN 24 (H) 06/18/2022 0321   CREATININE 1.44 (H) 06/18/2022 0321   CALCIUM 9.6 06/18/2022 0321   PROT 6.6 06/16/2022 1225   ALBUMIN 3.5 06/16/2022 1225   AST 17 06/16/2022 1225   ALT 11 06/16/2022 1225   ALKPHOS 61 06/16/2022 1225   BILITOT 1.1 06/16/2022 1225   GFRNONAA 35 (L) 06/18/2022 0321   GFRAA 52 (L) 12/31/2015 1623   COAGS Lab Results  Component Value Date   INR 1.0 06/16/2022   INR 1.00 08/12/2012   Lipid Panel    Component Value Date/Time   CHOL 204 (H) 06/17/2022 0227   TRIG 117 06/17/2022 0227   HDL 44 06/17/2022 0227  CHOLHDL 4.6 06/17/2022 0227   VLDL 23 06/17/2022 0227   LDLCALC 137 (H) 06/17/2022 0227   HgbA1C  Lab Results  Component Value Date   HGBA1C 7.3 (H) 06/17/2022   Urinalysis    Component Value Date/Time   COLORURINE YELLOW 06/16/2022 1507   APPEARANCEUR CLEAR 06/16/2022 1507   LABSPEC 1.036 (H) 06/16/2022 1507   PHURINE 6.0 06/16/2022 1507   GLUCOSEU NEGATIVE 06/16/2022 1507   HGBUR NEGATIVE 06/16/2022 1507   BILIRUBINUR NEGATIVE 06/16/2022 1507   KETONESUR 20 (A) 06/16/2022 1507   PROTEINUR 100 (A) 06/16/2022 1507   UROBILINOGEN 0.2 12/05/2016 1626   NITRITE NEGATIVE 06/16/2022 1507   LEUKOCYTESUR NEGATIVE 06/16/2022 1507   Urine Drug Screen     Component Value Date/Time   LABOPIA NONE DETECTED 06/16/2022 1507   COCAINSCRNUR NONE DETECTED 06/16/2022 1507    LABBENZ NONE DETECTED 06/16/2022 1507   AMPHETMU NONE DETECTED 06/16/2022 1507   THCU NONE DETECTED 06/16/2022 1507   LABBARB NONE DETECTED 06/16/2022 1507    Alcohol Level    Component Value Date/Time   ETH <10 06/16/2022 1225     SIGNIFICANT DIAGNOSTIC STUDIES ECHOCARDIOGRAM COMPLETE  Result Date: 06/18/2022    ECHOCARDIOGRAM REPORT   Patient Name:   Amber Bolton Date of Exam: 06/18/2022 Medical Rec #:  [id number]       Height:       63.0 in Accession #:    [id number]      Weight:       133.2 lb Date of Birth:  22-Dec-1929       BSA:          1.627 m Patient Age:    89 years        BP:           155/81 mmHg Patient Gender: F               HR:           84 bpm. Exam Location:  Inpatient Procedure: 2D Echo, Cardiac Doppler and Color Doppler Indications:    Stroke  History:        Patient has no prior history of Echocardiogram examinations.                 Risk Factors:Hypertension.  Sonographer:    Lucy Antigua Referring Phys: [phone] ERIC LINDZEN IMPRESSIONS  1. Left ventricular ejection fraction, by estimation, is 35 to 40%. Left ventricular ejection fraction by PLAX is 38 %. The left ventricle has moderately decreased function. The left ventricle demonstrates global hypokinesis. There is severe concentric left ventricular hypertrophy. Left ventricular diastolic parameters are consistent with Grade I diastolic dysfunction (impaired relaxation).  2. Right ventricular systolic function is normal. The right ventricular size is normal.  3. The mitral valve is abnormal. Trivial mitral valve regurgitation.  4. The aortic valve is tricuspid. Aortic valve regurgitation is trivial. Aortic valve sclerosis is present, with no evidence of aortic valve stenosis.  5. The inferior vena cava is normal in size with greater than 50% respiratory variability, suggesting right atrial pressure of 3 mmHg. Comparison(s): No prior Echocardiogram. FINDINGS  Left Ventricle: Left ventricular ejection fraction, by estimation, is  35 to 40%. Left ventricular ejection fraction by PLAX is 38 %. The left ventricle has moderately decreased function. The left ventricle demonstrates global hypokinesis. The left ventricular internal cavity size was normal in size. There is severe concentric left ventricular hypertrophy. Abnormal (paradoxical) septal motion, consistent with left bundle branch block. Left  ventricular diastolic parameters are consistent with Grade I diastolic dysfunction (impaired relaxation). Indeterminate filling pressures. Right Ventricle: The right ventricular size is normal. No increase in right ventricular wall thickness. Right ventricular systolic function is normal. Left Atrium: Left atrial size was normal in size. Right Atrium: Right atrial size was normal in size. Pericardium: Trivial pericardial effusion is present. The pericardial effusion is posterior to the left ventricle. Mitral Valve: The mitral valve is abnormal. Mild mitral annular calcification. Trivial mitral valve regurgitation. Tricuspid Valve: The tricuspid valve is grossly normal. Tricuspid valve regurgitation is trivial. Aortic Valve: The aortic valve is tricuspid. Aortic valve regurgitation is trivial. Aortic valve sclerosis is present, with no evidence of aortic valve stenosis. Aortic valve mean gradient measures 2.0 mmHg. Aortic valve peak gradient measures 4.7 mmHg. Aortic valve area, by VTI measures 4.02 cm. Pulmonic Valve: The pulmonic valve was normal in structure. Pulmonic valve regurgitation is not visualized. Aorta: The aortic root and ascending aorta are structurally normal, with no evidence of dilitation. Venous: The inferior vena cava is normal in size with greater than 50% respiratory variability, suggesting right atrial pressure of 3 mmHg. IAS/Shunts: The interatrial septum was not well visualized.  LEFT VENTRICLE PLAX 2D LV EF:         Left            Diastology                ventricular     LV e' medial:    3.15 cm/s                ejection         LV E/e' medial:  14.8                fraction by     LV e' lateral:   7.51 cm/s                PLAX is 38      LV E/e' lateral: 6.2                %. LVIDd:         3.40 cm LVIDs:         2.80 cm LV PW:         1.50 cm LV IVS:        1.60 cm LVOT diam:     2.10 cm LV SV:         58 LV SV Index:   36 LVOT Area:     3.46 cm  LV Volumes (MOD) LV vol d, MOD    79.4 ml A2C: LV vol d, MOD    63.6 ml A4C: LV vol s, MOD    52.4 ml A2C: LV vol s, MOD    40.0 ml A4C: LV SV MOD A2C:   27.0 ml LV SV MOD A4C:   63.6 ml LV SV MOD BP:    23.9 ml RIGHT VENTRICLE RV S prime:     14.00 cm/s TAPSE (M-mode): 2.2 cm LEFT ATRIUM             Index        RIGHT ATRIUM           Index LA Vol (A2C):   30.9 ml 19.00 ml/m  RA Area:     15.70 cm LA Vol (A4C):   26.3 ml 16.17 ml/m  RA Volume:   37.80 ml  23.24 ml/m LA Biplane Vol: 28.9 ml 17.77 ml/m  AORTIC VALVE AV Area (Vmax):    3.40 cm AV Area (Vmean):   3.66 cm AV Area (VTI):     4.02 cm AV Vmax:           108.00 cm/s AV Vmean:          68.600 cm/s AV VTI:            0.144 m AV Peak Grad:      4.7 mmHg AV Mean Grad:      2.0 mmHg LVOT Vmax:         106.00 cm/s LVOT Vmean:        72.400 cm/s LVOT VTI:          0.167 m LVOT/AV VTI ratio: 1.16  AORTA Ao Root diam: 3.10 cm Ao Asc diam:  3.10 cm MITRAL VALVE MV Area (PHT): 4.36 cm    SHUNTS MV Decel Time: 174 msec    Systemic VTI:  0.17 m MV E velocity: 46.70 cm/s  Systemic Diam: 2.10 cm MV A velocity: 99.80 cm/s MV E/A ratio:  0.47 Zoila Shutter MD Electronically signed by Zoila Shutter MD Signature Date/Time: 06/18/2022/12:09:40 PM    Final    EEG adult  Result Date: 06/17/2022 Charlsie Quest, MD     06/17/2022  8:59 AM Patient Name: Amber Bolton MRN: [id number] Epilepsy Attending: Charlsie Quest Referring Physician/Provider: Marvel Plan, MD Date: 06/17/2022 Duration: 24 mins Patient history: 89 y.o. female with a PMHx of hypercalcemia, arthritis, HLD, HTN and sleep apnea who presents via EMS as a Code Stroke after acute  onset of left sided weakness at home. EEG to evaluate for seizure. Level of alertness: Awake AEDs during EEG study: None Technical aspects: This EEG study was done with scalp electrodes positioned according to the 10-20 International system of electrode placement. Electrical activity was reviewed with band pass filter of 1-70Hz , sensitivity of 7 uV/mm, display speed of 80mm/sec with a 60Hz  notched filter applied as appropriate. EEG data were recorded continuously and digitally stored.  Video monitoring was available and reviewed as appropriate. Description: The posterior dominant rhythm consists of 8 Hz activity of moderate voltage (25-35 uV) seen predominantly in posterior head regions, symmetric and reactive to eye opening and eye closing. Hyperventilation and photic stimulation were not performed.   IMPRESSION: This study is within normal limits. No seizures or epileptiform discharges were seen throughout the recording. A normal interictal EEG does not exclude the diagnosis of epilepsy. Charlsie Quest   MR BRAIN WO CONTRAST  Result Date: 06/17/2022 CLINICAL DATA:  89 year old female code stroke presentation yesterday. Right side subdural hematoma. Left side weakness. EXAM: MRI HEAD WITHOUT CONTRAST TECHNIQUE: Multiplanar, multiecho pulse sequences of the brain and surrounding structures were obtained without intravenous contrast. COMPARISON:  CT head, CTA head and neck yesterday. FINDINGS: Brain: Bilateral right greater than left small volume subdural hematoma. More extensive right side subdural hematoma is 3-4 mm in thickness (series 9, image 14 and series 6, image 26). More localized posterior superior left side subdural hematoma is up to 3 mm in thickness. No midline shift. No significant intracranial mass effect. Basilar cisterns remain normal. No intraventricular hemorrhage or ventriculomegaly. No restricted diffusion to suggest acute infarction. Cervicomedullary junction and pituitary are within  normal limits. Patchy and scattered bilateral cerebral white matter T2 and FLAIR hyperintensity, mild to moderate for age. No cortical encephalomalacia or definite chronic cerebral blood products. Deep gray matter nuclei, brainstem and cerebellum are normal for age. Vascular: Major intracranial  vascular flow voids are preserved with dominant appearing distal right vertebral artery. Skull and upper cervical spine: Negative for age visible cervical spine. Visualized bone marrow signal is within normal limits. Sinuses/Orbits: Postoperative changes to both globes, otherwise negative orbits. Paranasal Visualized paranasal sinuses and mastoids are stable and well aerated. Other: Grossly normal visible internal auditory structures. No discrete scalp hematoma. IMPRESSION: 1. Positive for small Bilateral Subdural Hematomas, right side (3-4 mm) more extensive than left (up to 3 mm). No significant intracranial mass effect. 2. Negative for acute infarct and no other acute intracranial abnormality. Mild to moderate for age cerebral white matter signal changes most commonly due to chronic small vessel disease. Electronically Signed   By: Odessa Fleming M.D.   On: 06/17/2022 06:15   DG Knee Complete 4 Views Left  Result Date: 06/16/2022 CLINICAL DATA:  Fall EXAM: LEFT KNEE - COMPLETE 4 VIEW COMPARISON:  None Available. FINDINGS: No evidence of fracture, dislocation, or joint effusion. Calcific density within the joint space could be seen in the setting of CPPD arthropathy. Vascular calcifications. Suprapatellar enthesophyte. IMPRESSION: 1. No fracture or dislocation. 2. Findings usggestive of CPPD arthropathy. Electronically Signed   By: Lorenza Cambridge M.D.   On: 06/16/2022 16:10   CT ANGIO HEAD NECK W WO CM (CODE STROKE)  Result Date: 06/16/2022 CLINICAL DATA:  Neuro deficit, acute, stroke suspected. EXAM: CT ANGIOGRAPHY HEAD AND NECK WITH AND WITHOUT CONTRAST TECHNIQUE: Multidetector CT imaging of the head and neck was  performed using the standard protocol during bolus administration of intravenous contrast. Multiplanar CT image reconstructions and MIPs were obtained to evaluate the vascular anatomy. Carotid stenosis measurements (when applicable) are obtained utilizing NASCET criteria, using the distal internal carotid diameter as the denominator. RADIATION DOSE REDUCTION: This exam was performed according to the departmental dose-optimization program which includes automated exposure control, adjustment of the mA and/or kV according to patient size and/or use of iterative reconstruction technique. CONTRAST:  75mL OMNIPAQUE IOHEXOL 350 MG/ML SOLN COMPARISON:  Head CT 06/16/2022. FINDINGS: CTA NECK FINDINGS Aortic arch: Incompletely imaged. Visualized arch vessel origins are patent. Right carotid system: Irregular narrowing of the mid and distal right cervical ICA, consistent with fibromuscular dysplasia. Left carotid system: Irregular narrowing of the mid and distal left cervical ICA, consistent with fibromuscular dysplasia. Vertebral arteries: Right dominant vertebral artery. Both vessels are patent to the skull base. Skeleton: Extensive degenerative changes of the cervical spine with at least moderate stenosis at C5-6. Other neck: Unremarkable. Upper chest: Unremarkable. Review of the MIP images confirms the above findings CTA HEAD FINDINGS Anterior circulation: Intracranial ICAs are patent without stenosis or aneurysm. The proximal ACAs and MCAs are patent without stenosis or aneurysm. Distal branches are symmetric. Posterior circulation: The left vertebral artery functionally terminates in PICA. The right vertebral artery and basilar arteries are patent without stenosis or aneurysm. The SCAs, AICAs and PICAs are patent proximally. The PCAs are patent proximally without stenosis or aneurysm. Distal branches are symmetric. Venous sinuses: As permitted by contrast timing, patent. Anatomic variants: Persistent fetal origin of the  left PCA with hypoplastic left P1 segment. Review of the MIP images confirms the above findings IMPRESSION: 1. No large vessel occlusion or significant stenosis of the head or neck vessels. 2. 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 Electronically Signed   By: Orvan Falconer M.D.   On: 06/16/2022 13:06   CT HEAD CODE STROKE WO CONTRAST`  Result Date: 06/16/2022 CLINICAL DATA:  Code stroke. Neuro deficit with acute stroke  suspected EXAM: CT HEAD WITHOUT CONTRAST TECHNIQUE: Contiguous axial images were obtained from the base of the skull through the vertex without intravenous contrast. RADIATION DOSE REDUCTION: This exam was performed according to the departmental dose-optimization program which includes automated exposure control, adjustment of the mA and/or kV according to patient size and/or use of iterative reconstruction technique. COMPARISON:  11/21/2020 FINDINGS: Brain: Thin isodense subdural collection along the bilateral cerebral convexity measuring up to 4 mm thickness on the right and 3 mm in the left, see coronal reformats. Chronic small vessel ischemia in the cerebral white matter. Generalized cerebral volume loss. Vascular: No hyperdense vessel or unexpected calcification. Skull: Normal. Negative for fracture or focal lesion. Sinuses/Orbits: No acute finding. Other: These results were called by telephone at the time of interpretation on 06/16/2022 at 12:40 pm to provider [facility] , who verbally acknowledged these results. ASPECTS [facility] Stroke Program Early CT Score) Not scored given the above. IMPRESSION: 1. Nonacute/isodense subdural hematoma along the bilateral cerebral convexity measuring up to 4 mm in thickness on the right. 2. Chronic small vessel ischemia and atrophy. Electronically Signed   By: Tiburcio Pea M.D.   On: 06/16/2022 12:46      HISTORY OF PRESENT ILLNESS Amber Bolton is a 89 y.o. female with history of PMHx of hypercalcemia, arthritis,  HLD, HTN and sleep apnea who was BIB EMS as a Code Stroke after acute onset of left sided weakness at home. She suddenly felt "swimmy headed" and fell to the floor. She called out for a family member who noted the patient was weak on the left. EMS was called and on their arrival she was almost completely flaccid on the left and unable to speak. Initial BP was 260/110 which improved to 214/100 en route. Her left sided weakness started to improve during transport to the ED and speech also started to return. Exam in ED showed rapidly improving deficit    HOSPITAL COURSE Possible seizure given bilateral SDH vs TIA Bilateral subacute SDH, R>L Code Stroke CT head: Nonacute/isodense subdural hematoma along the bilateral cerebral convexity measuring up to 4 mm in thickness on the right. Chronic small vessel ischemia and atrophy. CTA head & neck: No large vessel occlusion or significant stenosis of the head or neck vessels. MRI: Negative for acute infarct and no other acute intracranial abnormality. Positive for small Bilateral Subdural Hematomas, right side (3-4 mm) more extensive than left (up to 3 mm). No significant intracranial mass effect. 2D Echo: LVEF 35 to 40%, global hypokinesis, severe concentric left ventricular hypertrophy, grade 1 diastolic dysfunction, trivial mitral valve regurgitation, trivial aortic valve regurgitation. EEG: negative LDL 137 HgbA1c 7.3 UDS negative VTE prophylaxis -heparin subcu No antithrombotic prior to admission, now on No antithrombotic due to subdural hematomas.  Add low-dose Keppra 250 twice daily, continue at discharge.  Therapy recommendations: Home health OT/PT Disposition:  home with home health   Hypertension Home meds:   lisinopril 20mg , amlodipine 10mg , HCTZ 12.5 SBP goal <160, Stable with PO medications Lisinopril increased to 40mg  HCTZ increased to 25 Continue home amlodipine 10 Long-term BP goal normotensive   Hyperlipidemia Home meds:  none LDL  137, goal < 70 On Crestor 10 High intensity statin not indicated given advanced age Continue statin on discharge   Diabetes type II, new diagnosis HgbA1c 7.3, goal < 7.0 CBGs SSI Close outpatient PCP follow-up   Other Stroke Risk Factors Advanced Age >/= 74   Hx stroke/TIA Obstructive sleep apnea  Other acute issues  History of hypercalcemia AKI creatinine 1.13-1.00-1.25-1.44, PCP follow-up Polycythemia, hemoglobin 16.2-17.0-15.2-14.6  DISCHARGE EXAM Blood pressure 138/82, pulse 66, temperature 98.7 F (37.1 C), temperature source Oral, resp. rate 17, height  (1.6 m), weight 60.4 kg, SpO2 99 %.  Patient sitting in chair, awake, alert, eyes open, orientated to age, place, time.  No aphasia, fluent language, following all simple commands. Able to name and repeat.  No gaze palsy, tracking bilaterally, visual field full, PERRL.  No facial droop. Tongue midline.  Bilateral UEs 4/5, no drift. Bilaterally LEs 34-/5, no drift.  Sensation symmetrical bilaterally, b/l FTN intact, gait not tested.    Discharge Diet       There are no active orders of the following types: Diet, Nourishments.   liquids  DISCHARGE PLAN Disposition:  home with home health OT No antithrombotic due to subdural hematomas. Ongoing stroke risk factor control by Primary Care Physician at time of discharge Follow-up PCP [facility] in 2 weeks. Follow-up in [facility], Dr. Pearlean Brownie,  in 2-3 weeks, office to schedule an appointment.   35 minutes were spent preparing discharge.   Pt seen by Neuro NP/APP and later by MD. Note/plan to be edited by MD as needed.    Lynnae January, DNP, AGACNP-BC [facility] [phone] for Stroke APP   ATTENDING NOTE: I reviewed above note and agree with the assessment and plan. Pt was seen and examined.   Daughter at bedside.  Patient sitting in chair, awake alert orientated, in good spirit,  interactive.  Moving all extremities.  Started low-dose Keppra yesterday, tolerating well.  PT started recommend home health.  Patient will be discharged in good condition.  Follow-up with Dr. Darien Ramus in 2 to 3 weeks.  For detailed assessment and plan, please refer to above/below as I have made changes wherever appropriate.   Marvel Plan, MD PhD Stroke Neurology 06/18/2022 10:29 PM

## 2022-06-18 NOTE — Progress Notes (Signed)
Occupational Therapy Treatment Patient Details Name: Amber Bolton MRN: [id number] DOB: 06-16-1929 Today's Date: 06/18/2022   History of present illness 89 y.o. female presents to ED on 4/16 via EMS as a Code Stroke after acute onset of left sided weakness at home, resolved in transit. 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 Suspect TIA PMHx of hypercalcemia, arthritis, HLD, HTN and sleep apnea   OT comments  Pt was seen for OT ADL retraining session with focus on bed mobility, functional transfers, hygiene and standing at sink for grooming. Pt is overall Min guard for tansfers using RW and benefits from vc's for safety and hand placement especially when transferring to/from toilet and when standing at sink. Pt completed toilet transfer and 2 grooming tasks before returning to bed. She is likely approaching her baseline level and should benefit from continued skilled acute OT to assist in maximizing independence with self care and functional mobility related to ADL's.   Recommendations for follow up therapy are one component of a multi-disciplinary discharge planning process, led by the attending physician.  Recommendations may be updated based on patient status, additional functional criteria and insurance authorization.    Assistance Recommended at Discharge Frequent or constant Supervision/Assistance  Patient can return home with the following  A little help with bathing/dressing/bathroom;Assistance with cooking/housework;Assist for transportation;Help with stairs or ramp for entrance   Equipment Recommendations  Tub/shower bench;Other (comment) (family may purchase from outside source)    Recommendations for Other Services      Precautions / Restrictions Precautions Precautions: Fall;Other (comment) Precaution Comments: keep Systolic BP <160 Restrictions Weight Bearing Restrictions: No       Mobility Bed Mobility Overal bed mobility: Needs Assistance Bed Mobility: Supine  to Sit, Sit to Supine     Supine to sit: Supervision, HOB elevated Sit to supine: Min guard   General bed mobility comments: HOB elevated, increased time, min guard for safety due to elevated bed height    Transfers Overall transfer level: Needs assistance Equipment used: Rolling walker (2 wheels) Transfers: Sit to/from Stand, Bed to chair/wheelchair/BSC Sit to Stand: Min guard     Step pivot transfers: Min guard, Min assist     General transfer comment: Pt needed min instructional cueing for hand placement with sit to stand from bed and toilet in pt bathroom     Balance Overall balance assessment: Needs assistance Sitting-balance support: Feet supported, No upper extremity supported Sitting balance-Leahy Scale: Fair   Standing balance support: Reliant on assistive device for balance, During functional activity, Bilateral upper extremity supported Standing balance-Leahy Scale: Poor Standing balance comment: Pt needs UE support for balance with mobility     ADL either performed or assessed with clinical judgement   ADL Overall ADL's : Needs assistance/impaired Eating/Feeding: Independent;Sitting   Grooming: Wash/dry hands;Wash/dry face;Standing;Min guard   Lower Body Bathing: Sit to/from stand;Sitting/lateral leans;Min guard   Upper Body Dressing : Min guard;Sitting   Toilet Transfer: Min guard;Ambulation;Regular Toilet;Rolling walker (2 wheels) Toilet Transfer Details (indicate cue type and reason): Use of grab bar. VC's for hand placement during transfers Toileting- Architect and Hygiene: Min guard;Sit to/from stand;Sitting/lateral lean Toileting - Clothing Manipulation Details (indicate cue type and reason): performed after bm on toilet   Functional mobility during ADLs: Min guard;Rolling walker (2 wheels);Cueing for safety;Cueing for sequencing General ADL Comments: Pt was seen for OT ADL retraining session with focus on bed mobility, functional  transfers, hygiene and standing at sink for grooming. Pt is overall Min guard  for tansfers using RW and benefits from vc's for safety and hand placement especially when transferring to/from toilet and when standing at sink. Pt completed toilet transfer and 2 grooming tasks before returning to bed. She is likely approaching her baseline level and should benefit from continued skilled acute OT to assist in maximizing independence with self care and functional mobility related to ADL's.    Extremity/Trunk Assessment Upper Extremity Assessment Upper Extremity Assessment: Generalized weakness;Defer to OT evaluation   Lower Extremity Assessment Lower Extremity Assessment: Defer to PT evaluation   Cervical / Trunk Assessment Cervical / Trunk Assessment: Normal    Vision Baseline Vision/History: 1 Wears glasses Ability to See in Adequate Light: 0 Adequate Patient Visual Report: No change from baseline Vision Assessment?: No apparent visual deficits          Cognition Arousal/Alertness: Awake/alert Behavior During Therapy: WFL for tasks assessed/performed Overall Cognitive Status: History of cognitive impairments - at baseline              General Comments  Pt reported stomach pain that was relieved after having had bm. RN was made aware and assessed.     Pertinent Vitals/ Pain       Pain Assessment Pain Assessment: Faces Faces Pain Scale: Hurts little more Pain Location: Stomach pain before BM, resolved after using bathroom Pain Descriptors / Indicators: Aching, Discomfort, Grimacing, Moaning Pain Intervention(s): Limited activity within patient's tolerance, Monitored during session, Repositioned, Other (comment) (RN notified and aware)  Home Living  Please refer to initial OT assessment for home situation. Lives with family    Prior Functioning/Environment  Please refer to initial OT assessment for prior level of function.   Frequency  Min 2X/week        Progress Toward  Goals  OT Goals(current goals can now be found in the care plan section)  Progress towards OT goals: Progressing toward goals  Acute Rehab OT Goals Patient Stated Goal: None stated OT Goal Formulation: With patient Time For Goal Achievement: 07/01/22 Potential to Achieve Goals: Good  Plan Discharge plan remains appropriate       AM-PAC OT "6 Clicks" Daily Activity     Outcome Measure   Help from another person eating meals?: None Help from another person taking care of personal grooming?: A Little Help from another person toileting, which includes using toliet, bedpan, or urinal?: A Little Help from another person bathing (including washing, rinsing, drying)?: A Little Help from another person to put on and taking off regular upper body clothing?: A Little Help from another person to put on and taking off regular lower body clothing?: A Little 6 Click Score: 19    End of Session Equipment Utilized During Treatment: Rolling walker (2 wheels)  OT Visit Diagnosis: Unsteadiness on feet (R26.81);Muscle weakness (generalized) (M62.81)   Activity Tolerance Patient tolerated treatment well   Patient Left in bed;with call bell/phone within reach;with bed alarm set   Nurse Communication Mobility status;Other (comment) (Pt c/o stomach pain when in bathroom during bm)        Time: 1610-9604 OT Time Calculation (min): 36 min  Charges: OT General Charges $OT Visit: 1 Visit OT Treatments $Self Care/Home Management : 23-37 mins   Arine Foley Beth Dixon, OTR/L 06/18/2022, 11:28 AM

## 2022-06-30 ENCOUNTER — Inpatient Hospital Stay: Payer: Medicare Other | Admitting: Neurology

## 2022-07-02 ENCOUNTER — Encounter: Payer: Self-pay | Admitting: Neurology

## 2022-07-02 ENCOUNTER — Ambulatory Visit: Payer: Medicare Other | Admitting: Neurology

## 2022-07-02 VITALS — BP 126/80 | HR 80 | Ht 59.0 in | Wt 124.8 lb

## 2022-07-02 DIAGNOSIS — G459 Transient cerebral ischemic attack, unspecified: Secondary | ICD-10-CM

## 2022-07-02 DIAGNOSIS — S065XAA Traumatic subdural hemorrhage with loss of consciousness status unknown, initial encounter: Secondary | ICD-10-CM | POA: Diagnosis not present

## 2022-07-02 DIAGNOSIS — R569 Unspecified convulsions: Secondary | ICD-10-CM

## 2022-07-02 DIAGNOSIS — G3184 Mild cognitive impairment, so stated: Secondary | ICD-10-CM

## 2022-07-02 NOTE — Patient Instructions (Addendum)
I had a long discussion with the patient and her daughter regarding her recent episode of difficulty speaking and weakness possible TIA versus seizure discussed results of recent hospital admission and answered questions.  I recommend continuing Keppra 250 mg twice daily for now repeating EEG study.  We also discussed memory compensation strategies and advised her to increase participation in cognitively challenging activities like solving crossword puzzles, playing bridge and sudoku.  She was advised to use her walker at all times discussed fall safety precautions.  Plan for follow-up in the future in 4 months or call earlier if necessary  Memory Compensation Strategies  Use "WARM" strategy.  W= write it down  A= associate it  R= repeat it  M= make a mental note  2.   You can keep a Glass blower/designer.  Use a 3-ring notebook with sections for the following: calendar, important names and phone numbers,  medications, doctors' names/phone numbers, lists/reminders, and a section to journal what you did  each day.   3.    Use a calendar to write appointments down.  4.    Write yourself a schedule for the day.  This can be placed on the calendar or in a separate section of the Memory Notebook.  Keeping a  regular schedule can help memory.  5.    Use medication organizer with sections for each day or morning/evening pills.  You may need help loading it  6.    Keep a basket, or pegboard by the door.  Place items that you need to take out with you in the basket or on the pegboard.  You may also want to  include a message board for reminders.  7.    Use sticky notes.  Place sticky notes with reminders in a place where the task is performed.  For example: " turn off the  stove" placed by the stove, "lock the door" placed on the door at eye level, " take your medications" on  the bathroom mirror or by the place where you normally take your medications.  8.    Use alarms/timers.  Use while cooking to  remind yourself to check on food or as a reminder to take your medicine, or as a  reminder to make a call, or as a reminder to perform another task, etc.

## 2022-07-02 NOTE — Progress Notes (Addendum)
[facility] [address] [place]. [place] [address] [phone]       OFFICE CONSULT NOTE  [name] Date of Birth:  1929/03/18 Medical Record Number:  [id number]   Referring MD: Marvel Plan  Reason for Referral: TIA versus seizure  HPI: Amber Bolton is a pleasant 89 year old African-American lady seen today for initial office consultation visit.  She is accompanied by her daughter.  History is obtained from them and review of electronic medical records.  I personally reviewed pertinent available imaging films in PACS.Amber Bolton is a 89 y.o. female with history of PMHx of hypercalcemia, arthritis, HLD, HTN and sleep apnea who was BIB EMS as a Code Stroke after acute onset of left sided weakness at home. She suddenly felt "swimmy headed" and fell to the floor. She called out for a family member who noted the patient was weak on the left. EMS was called and on their arrival she was almost completely flaccid on the left and unable to speak. Initial BP was 260/110 which improved to 214/100 en route. Her left sided weakness started to improve during transport to the ED and speech also started to return. Exam in ED showed rapidly improving deficit code stroke CT head showed nonacute isodense subdural hematoma along bilateral cerebral convexity measuring 4 mm in thickness on the right.  CT angiogram of the head and neck shows no large vessel stenosis or occlusion.  MRI scan of the brain was negative for acute infarct.  Small bilateral subdural hematomas were unchanged.  Echocardiogram showed diminished ejection fraction of 35 to 40% with global hypokinesis.  EEG was negative for seizures.  LDL cholesterol 137 mg percent.  Hemoglobin A1c was 7.3.  Urine drug screen was negative.  Patient was started on Keppra 250 mg twice daily for seizure prophylaxis.  She states she has done well since then.  She has had no further episodes of dizziness, weakness or seizure-like activity.   She is tolerating Keppra well without any side effects.  She is living at home with her daughter.  He is able to ambulate with a walker by herself.  She has memory and cognitive difficulties which are longstanding and change.  He remembers having had a fall in the kitchen 3 years ago when she hit her head but at that time did not seek medical attention or get a brain scan done.    ROS:   14 system review of systems is positive for left-sided weakness, speech difficulty, dizziness, memory loss and all other systems negative  PMH:  Past Medical History:  Diagnosis Date   Arthritis    IN RIGHT HIP AND FINGERS AND BACK   Hypercalcemia    Hyperlipemia    Resistant hypertension 03/09/2022   Sleep apnea    STATES SHE HAS CPAP - BUT HAS NOT USED IN THE LAST 2 YRS    Social History:  Social History   Socioeconomic History   Marital status: Married    Spouse name: Not on file   Number of children: Not on file   Years of education: Not on file   Highest education level: Not on file  Occupational History   Not on file  Tobacco Use   Smoking status: Never   Smokeless tobacco: Never  Vaping Use   Vaping Use: Never used  Substance and Sexual Activity   Alcohol use: No   Drug use: No   Sexual activity: Not on file  Other Topics Concern  Not on file  Social History Narrative   Not on file   Social Determinants of Health   Financial Resource Strain: Not on file  Food Insecurity: No Food Insecurity (06/17/2022)   Hunger Vital Sign    Worried About Running Out of Food in the Last Year: Never true    Ran Out of Food in the Last Year: Never true  Transportation Needs: No Transportation Needs (06/17/2022)   PRAPARE - Administrator, Civil Service (Medical): No    Lack of Transportation (Non-Medical): No  Physical Activity: Not on file  Stress: Not on file  Social Connections: Not on file  Intimate Partner Violence: Not At Risk (06/17/2022)   Humiliation, Afraid, Rape, and  Kick questionnaire    Fear of Current or Ex-Partner: No    Emotionally Abused: No    Physically Abused: No    Sexually Abused: No    Medications:   Current Outpatient Medications on File Prior to Visit  Medication Sig Dispense Refill   ALPRAZolam (XANAX) 0.5 MG tablet Take 0.25 mg by mouth at bedtime as needed for sleep.      amLODipine (NORVASC) 10 MG tablet Take 10 mg by mouth daily.     hydrochlorothiazide (HYDRODIURIL) 25 MG tablet Take 1 tablet (25 mg total) by mouth daily. 30 tablet 0   levETIRAcetam (KEPPRA) 250 MG tablet Take 1 tablet (250 mg total) by mouth 2 (two) times daily. 60 tablet 0   lisinopril (ZESTRIL) 40 MG tablet Take 1 tablet (40 mg total) by mouth daily. 30 tablet 0   ondansetron (ZOFRAN) 4 MG tablet Take 1 tablet (4 mg total) by mouth every 8 (eight) hours as needed for nausea or vomiting. 20 tablet 0   rosuvastatin (CRESTOR) 10 MG tablet Take 1 tablet (10 mg total) by mouth daily. 30 tablet 0   traMADol (ULTRAM) 50 MG tablet Take 50 mg by mouth 2 (two) times daily as needed for moderate pain or severe pain.     triamcinolone cream (KENALOG) 0.1 % Apply 1 Application topically daily as needed (rash).     No current facility-administered medications on file prior to visit.    Allergies:   Allergies  Allergen Reactions   Penicillins Rash   Shellfish Allergy Itching    Mouth itches    Physical Exam General: well developed, well nourished, pleasant elderly African-American lady seated, in no evident distress Head: head normocephalic and atraumatic.   Neck: supple with no carotid or supraclavicular bruits Cardiovascular: regular rate and rhythm, no murmurs Musculoskeletal: no deformity Skin:  no rash/petichiae Vascular:  Normal pulses all extremities  Neurologic Exam Mental Status: Awake and fully alert. Oriented to place and time. Recent and remote memory intact. Attention span, concentration and fund of knowledge appropriate. Mood and affect appropriate.   Diminished recall 1/3.  Able to name only 4 animals which can walk on 4 legs.  Clock drawing 3/4.  Mental status exam not done Cranial Nerves: Fundoscopic exam reveals sharp disc margins. Pupils equal, briskly reactive to light. Extraocular movements full without nystagmus. Visual fields full to confrontation. Hearing significantly diminished bilaterally facial sensation intact. Face, tongue, palate moves normally and symmetrically.  Motor: Normal bulk and tone. Normal strength in all tested extremity muscles. Sensory.: intact to touch , pinprick , position and vibratory sensation.  Coordination: Rapid alternating movements normal in all extremities. Finger-to-nose and heel-to-shin performed accurately bilaterally. Gait and Station: Arises from chair without difficulty. Stance is stooped. Gait cautious steps but  steady.  Not able to heel, toe and tandem walk without difficulty.  Reflexes: 1+ and symmetric. Toes downgoing.   NIHSS  0 Modified Rankin  2      No data to display           ASSESSMENT: 89 year old African-American lady with transient episode of speech difficulties and left-sided weakness possible TIA versus seizure in the setting of bilateral chronic subdural hematomas.  She also has mild cognitive impairment     PLAN:I had a long discussion with the patient and her daughter regarding her recent episode of difficulty speaking and weakness possible TIA versus seizure discussed results of recent hospital admission and answered questions.  I recommend continuing Keppra 250 mg twice daily for now repeating EEG study.  We also discussed memory compensation strategies and advised her to increase participation in cognitively challenging activities like solving crossword puzzles, playing bridge and sudoku.  She was advised to use her walker at all times discussed fall safety precautions.  Plan for follow-up in the future in 4 months or call earlier if necessary.  Greater than 50% time  during this 50-minute visit was spent on counseling and coordination of care about obstructive edema,, possible seizure versus TIA but mild cognitive impairment.   Delia Heady, MD Note: This document was prepared with digital dictation and possible smart phrase technology. Any transcriptional errors that result from this process are unintentional.

## 2022-07-09 ENCOUNTER — Emergency Department (HOSPITAL_COMMUNITY): Payer: Medicare Other

## 2022-07-09 ENCOUNTER — Emergency Department (HOSPITAL_COMMUNITY)
Admission: EM | Admit: 2022-07-09 | Discharge: 2022-07-09 | Disposition: A | Discharge status: Home / Self Care | Payer: Medicare Other | Attending: Emergency Medicine | Admitting: Emergency Medicine

## 2022-07-09 DIAGNOSIS — R531 Weakness: Secondary | ICD-10-CM | POA: Insufficient documentation

## 2022-07-09 DIAGNOSIS — Z8673 Personal history of transient ischemic attack (TIA), and cerebral infarction without residual deficits: Secondary | ICD-10-CM | POA: Diagnosis not present

## 2022-07-09 LAB — CK: Total CK: 30 U/L — ABNORMAL LOW (ref 38–234)

## 2022-07-09 LAB — CBC WITH DIFFERENTIAL/PLATELET
Abs Immature Granulocytes: 0.02 10*3/uL (ref 0.00–0.07)
Basophils Absolute: 0.1 10*3/uL (ref 0.0–0.1)
Basophils Relative: 1 %
Eosinophils Absolute: 0.1 10*3/uL (ref 0.0–0.5)
Eosinophils Relative: 2 %
HCT: 48.5 % — ABNORMAL HIGH (ref 36.0–46.0)
Hemoglobin: 15.8 g/dL — ABNORMAL HIGH (ref 12.0–15.0)
Immature Granulocytes: 0 %
Lymphocytes Relative: 27 %
Lymphs Abs: 1.5 10*3/uL (ref 0.7–4.0)
MCH: 29.9 pg (ref 26.0–34.0)
MCHC: 32.6 g/dL (ref 30.0–36.0)
MCV: 91.7 fL (ref 80.0–100.0)
Monocytes Absolute: 0.6 10*3/uL (ref 0.1–1.0)
Monocytes Relative: 11 %
Neutro Abs: 3.3 10*3/uL (ref 1.7–7.7)
Neutrophils Relative %: 59 %
Platelets: 143 10*3/uL — ABNORMAL LOW (ref 150–400)
RBC: 5.29 MIL/uL — ABNORMAL HIGH (ref 3.87–5.11)
RDW: 11.9 % (ref 11.5–15.5)
WBC: 5.5 10*3/uL (ref 4.0–10.5)
nRBC: 0 % (ref 0.0–0.2)

## 2022-07-09 LAB — COMPREHENSIVE METABOLIC PANEL
ALT: 12 U/L (ref 0–44)
AST: 16 U/L (ref 15–41)
Albumin: 3.2 g/dL — ABNORMAL LOW (ref 3.5–5.0)
Alkaline Phosphatase: 50 U/L (ref 38–126)
Anion gap: 11 (ref 5–15)
BUN: 27 mg/dL — ABNORMAL HIGH (ref 8–23)
CO2: 24 mmol/L (ref 22–32)
Calcium: 10.4 mg/dL — ABNORMAL HIGH (ref 8.9–10.3)
Chloride: 102 mmol/L (ref 98–111)
Creatinine, Ser: 1.41 mg/dL — ABNORMAL HIGH (ref 0.44–1.00)
GFR, Estimated: 35 mL/min — ABNORMAL LOW (ref >60)
Glucose, Bld: 164 mg/dL — ABNORMAL HIGH (ref 70–99)
Potassium: 3.5 mmol/L (ref 3.5–5.1)
Sodium: 137 mmol/L (ref 135–145)
Total Bilirubin: 0.9 mg/dL (ref 0.3–1.2)
Total Protein: 6.1 g/dL — ABNORMAL LOW (ref 6.5–8.1)

## 2022-07-09 LAB — URINALYSIS, ROUTINE W REFLEX MICROSCOPIC
Bacteria, UA: NONE SEEN
Bilirubin Urine: NEGATIVE
Glucose, UA: NEGATIVE mg/dL
Hgb urine dipstick: NEGATIVE
Ketones, ur: NEGATIVE mg/dL
Leukocytes,Ua: NEGATIVE
Nitrite: NEGATIVE
Protein, ur: NEGATIVE mg/dL
Specific Gravity, Urine: 1.009 (ref 1.005–1.030)
pH: 7 (ref 5.0–8.0)

## 2022-07-09 LAB — TROPONIN I (HIGH SENSITIVITY)
Troponin I (High Sensitivity): 10 ng/L (ref <18)
Troponin I (High Sensitivity): 9 ng/L (ref <18)

## 2022-07-09 LAB — TSH: TSH: 2.543 u[IU]/mL (ref 0.350–4.500)

## 2022-07-09 NOTE — ED Notes (Signed)
Per provider pt able to have food. Pts family assisting with meal at this time

## 2022-07-09 NOTE — ED Triage Notes (Signed)
PT BIB EMS from home for x3 days weakness. Pt usually able to walk around, but has not been able to get out of bed for 3 days.   EMS VS 114/63 81 HR 96% RA BG 172

## 2022-07-09 NOTE — ED Notes (Signed)
X-ray at bedside

## 2022-07-09 NOTE — ED Provider Notes (Signed)
Amber Bolton EMERGENCY DEPARTMENT AT [facility] Provider Note   CSN: [id number] Arrival date & time: 07/09/22  1421     History Chief Complaint  Patient presents with   Weakness    HPI Amber Bolton is a 89 y.o. female presenting for episodic confusion and dizziness.  She was seen 3 weeks ago thought to have a TIA with seizure episodes had been having intermittent visual symptoms and dizziness over the last 3 days.  Had another round of symptoms today that are now resolved.  She denies fevers chills nausea vomiting syncope shortness of breath but she is arranged for outpatient follow-up with neurology in the next week postop to come back with any new symptoms.    Patient's recorded medical, surgical, social, medication list and allergies were reviewed in the Snapshot window as part of the initial history.   Review of Systems   Review of Systems  Constitutional:  Negative for chills and fever.  HENT:  Negative for ear pain and sore throat.   Eyes:  Positive for visual disturbance. Negative for pain.  Respiratory:  Negative for cough and shortness of breath.   Cardiovascular:  Negative for chest pain and palpitations.  Gastrointestinal:  Negative for abdominal pain and vomiting.  Genitourinary:  Negative for dysuria and hematuria.  Musculoskeletal:  Negative for arthralgias and back pain.  Skin:  Negative for color change and rash.  Neurological:  Positive for dizziness. Negative for seizures and syncope.  All other systems reviewed and are negative.   Physical Exam Updated Vital Signs BP 104/72   Pulse 64   Temp 98.2 F (36.8 C)   Resp 16   Ht 4\' 11"  (1.499 m)   Wt 56.6 kg   SpO2 97%   BMI 25.20 kg/m  Physical Exam Vitals and nursing note reviewed.  Constitutional:      General: She is not in acute distress.    Appearance: She is well-developed.  HENT:     Head: Normocephalic and atraumatic.  Eyes:     Conjunctiva/sclera: Conjunctivae normal.   Cardiovascular:     Rate and Rhythm: Normal rate and regular rhythm.     Heart sounds: No murmur heard. Pulmonary:     Effort: Pulmonary effort is normal. No respiratory distress.     Breath sounds: Normal breath sounds.  Abdominal:     General: There is no distension.     Palpations: Abdomen is soft.     Tenderness: There is no abdominal tenderness. There is no right CVA tenderness or left CVA tenderness.  Musculoskeletal:        General: No swelling or tenderness. Normal range of motion.     Cervical back: Neck supple.  Skin:    General: Skin is warm and dry.  Neurological:     General: No focal deficit present.     Mental Status: She is alert and oriented to person, place, and time. Mental status is at baseline.     Cranial Nerves: No cranial nerve deficit.      ED Course/ Medical Decision Making/ A&P Clinical Course as of 07/09/22 2122  Thu Jul 09, 2022  1607 3 weeks ago had a dizziness and syncope episode thought to be a TIA and seizure More episodes similar today  Visual symptoms and dizziness today  BLLE numbness  All symptoms intermittent since noon yesterday. [CC]    Clinical Course User Index [CC] Glyn Ade, MD    Procedures Procedures   Medications Ordered in  ED Medications - No data to display Medical Decision Making:   Amber Bolton is a 89 y.o. female who presented to the ED today with altered mental status detailed above.    Additional history discussed with patient's family/caregivers.  Patient placed on continuous vitals and telemetry monitoring while in ED which was reviewed periodically.  Complete initial physical exam performed, notably the patient  was asymptomatic at the time of arrival.  Observed for 6 hours with no further symptoms.    Reviewed and confirmed nursing documentation for past medical history, family history, social history.    Initial Assessment:   With the patient's presentation of altered mental status, most likely  diagnosis is delerium 2/2 infectious etiology (UTI/CAP/URI) vs metabolic abnormality (Na/K/Mg/Ca) vs nonspecific etiology. Other diagnoses were considered including (but not limited to) CVA, ICH, intracranial mass, critical dehydration, heptatic dysfunction, uremia, hypercarbia, intoxication, endrocrine abnormality, toxidrome. These are considered less likely due to history of present illness and physical exam findings.   This is most consistent with an acute life/limb threatening illness complicated by underlying chronic conditions.  Initial Plan:  CTH to evaluate for intracranial etiology of patient's symptoms  Screening labs including CBC and Metabolic panel to evaluate for infectious or metabolic etiology of disease.  Urinalysis with reflex culture ordered to evaluate for UTI or relevant urologic/nephrologic pathology.  CXR to evaluate for structural/infectious intrathoracic pathology.  TSH for evaluation for endrocrine etiology Troponin/EKG to evaluate for cardiac pathology Objective evaluation as below reviewed   Initial Study Results:   Laboratory  All laboratory results reviewed without evidence of clinically relevant pathology.     EKG EKG was reviewed independently. Rate, rhythm, axis, intervals all examined and without medically relevant abnormality. ST segments without concerns for elevations.    Radiology:  All images reviewed independently. Agree with radiology report at this time.   CT HEAD WO CONTRAST ( )  Result Date: 07/09/2022 CLINICAL DATA:  Mental status change, unknown cause Weakness. EXAM: CT HEAD WITHOUT CONTRAST TECHNIQUE: Contiguous axial images were obtained from the base of the skull through the vertex without intravenous contrast. RADIATION DOSE REDUCTION: This exam was performed according to the departmental dose-optimization program which includes automated exposure control, adjustment of the mA and/or kV according to patient size and/or use of iterative  reconstruction technique. COMPARISON:  Head CT and brain MRI 06/16/2022 and 06/17/2022 FINDINGS: Brain: Stable size of the right subdural collection which is nonacute/isodense. This measures up to 4 mm series 5, image 33. Stable size of the left subdural collection that is nonacute/iodense. This measures 3 mm, series 5, image 40. No acute subdural bleed. No associated mass effect. No midline shift. No other acute intracranial hemorrhage. Stable ventricular size. No evidence of acute infarct. Periventricular chronic small vessel ischemic changes again seen. Vascular: No hyperdense vessel or unexpected calcification. Skull: No acute fracture. Stable right frontal bone island. No suspicious calvarial lesion. Sinuses/Orbits: No acute findings. Bilateral cataract resection. No mastoid effusion. Other: None. IMPRESSION: 1. No acute intracranial abnormality. 2. Stable size of bilateral subdural collections since imaging last month which are nonacute/iodense. No associated mass effect or midline shift. 3. Chronic small vessel ischemic changes. Electronically Signed   By: Narda Rutherford M.D.   On: 07/09/2022 16:12   DG Chest Portable 1 View  Result Date: 07/09/2022 CLINICAL DATA:  Altered mental status EXAM: PORTABLE CHEST 1 VIEW COMPARISON:  X-ray 12/28/2021 FINDINGS: Dilated tortuous and ectatic aorta. Underinflation with enlarged heart. No pneumothorax, effusion or edema. No consolidation. Bronchovascular  crowding. Overlapping cardiac leads. Osteopenia and degenerative changes of the skeleton. IMPRESSION: Underinflation with bronchovascular crowding. Enlarged heart with a tortuous and ectatic and dilated aorta. Electronically Signed   By: Karen Kays M.D.   On: 07/09/2022 15:50   ECHOCARDIOGRAM COMPLETE  Result Date: 06/18/2022    ECHOCARDIOGRAM REPORT   Patient Name:   DAYNE PASSARELLA Date of Exam: 06/18/2022 Medical Rec #:  [id number]       Height:       63.0 in Accession #:    [id number]      Weight:        133.2 lb Date of Birth:  March 13, 1929       BSA:          1.627 m Patient Age:    89 years        BP:           155/81 mmHg Patient Gender: F               HR:           84 bpm. Exam Location:  Inpatient Procedure: 2D Echo, Cardiac Doppler and Color Doppler Indications:    Stroke  History:        Patient has no prior history of Echocardiogram examinations.                 Risk Factors:Hypertension.  Sonographer:    Lucy Antigua Referring Phys: [phone] ERIC LINDZEN IMPRESSIONS  1. Left ventricular ejection fraction, by estimation, is 35 to 40%. Left ventricular ejection fraction by PLAX is 38 %. The left ventricle has moderately decreased function. The left ventricle demonstrates global hypokinesis. There is severe concentric left ventricular hypertrophy. Left ventricular diastolic parameters are consistent with Grade I diastolic dysfunction (impaired relaxation).  2. Right ventricular systolic function is normal. The right ventricular size is normal.  3. The mitral valve is abnormal. Trivial mitral valve regurgitation.  4. The aortic valve is tricuspid. Aortic valve regurgitation is trivial. Aortic valve sclerosis is present, with no evidence of aortic valve stenosis.  5. The inferior vena cava is normal in size with greater than 50% respiratory variability, suggesting right atrial pressure of 3 mmHg. Comparison(s): No prior Echocardiogram. FINDINGS  Left Ventricle: Left ventricular ejection fraction, by estimation, is 35 to 40%. Left ventricular ejection fraction by PLAX is 38 %. The left ventricle has moderately decreased function. The left ventricle demonstrates global hypokinesis. The left ventricular internal cavity size was normal in size. There is severe concentric left ventricular hypertrophy. Abnormal (paradoxical) septal motion, consistent with left bundle branch block. Left ventricular diastolic parameters are consistent with Grade I diastolic dysfunction (impaired relaxation). Indeterminate filling pressures.  Right Ventricle: The right ventricular size is normal. No increase in right ventricular wall thickness. Right ventricular systolic function is normal. Left Atrium: Left atrial size was normal in size. Right Atrium: Right atrial size was normal in size. Pericardium: Trivial pericardial effusion is present. The pericardial effusion is posterior to the left ventricle. Mitral Valve: The mitral valve is abnormal. Mild mitral annular calcification. Trivial mitral valve regurgitation. Tricuspid Valve: The tricuspid valve is grossly normal. Tricuspid valve regurgitation is trivial. Aortic Valve: The aortic valve is tricuspid. Aortic valve regurgitation is trivial. Aortic valve sclerosis is present, with no evidence of aortic valve stenosis. Aortic valve mean gradient measures 2.0 mmHg. Aortic valve peak gradient measures 4.7 mmHg. Aortic valve area, by VTI measures 4.02 cm. Pulmonic Valve: The pulmonic valve was normal in structure. Pulmonic  valve regurgitation is not visualized. Aorta: The aortic root and ascending aorta are structurally normal, with no evidence of dilitation. Venous: The inferior vena cava is normal in size with greater than 50% respiratory variability, suggesting right atrial pressure of 3 mmHg. IAS/Shunts: The interatrial septum was not well visualized.  LEFT VENTRICLE PLAX 2D LV EF:         Left            Diastology                ventricular     LV e' medial:    3.15 cm/s                ejection        LV E/e' medial:  14.8                fraction by     LV e' lateral:   7.51 cm/s                PLAX is 38      LV E/e' lateral: 6.2                %. LVIDd:         3.40 cm LVIDs:         2.80 cm LV PW:         1.50 cm LV IVS:        1.60 cm LVOT diam:     2.10 cm LV SV:         58 LV SV Index:   36 LVOT Area:     3.46 cm  LV Volumes (MOD) LV vol d, MOD    79.4 ml A2C: LV vol d, MOD    63.6 ml A4C: LV vol s, MOD    52.4 ml A2C: LV vol s, MOD    40.0 ml A4C: LV SV MOD A2C:   27.0 ml LV SV MOD A4C:    63.6 ml LV SV MOD BP:    23.9 ml RIGHT VENTRICLE RV S prime:     14.00 cm/s TAPSE (M-mode): 2.2 cm LEFT ATRIUM             Index        RIGHT ATRIUM           Index LA Vol (A2C):   30.9 ml 19.00 ml/m  RA Area:     15.70 cm LA Vol (A4C):   26.3 ml 16.17 ml/m  RA Volume:   37.80 ml  23.24 ml/m LA Biplane Vol: 28.9 ml 17.77 ml/m  AORTIC VALVE AV Area (Vmax):    3.40 cm AV Area (Vmean):   3.66 cm AV Area (VTI):     4.02 cm AV Vmax:           108.00 cm/s AV Vmean:          68.600 cm/s AV VTI:            0.144 m AV Peak Grad:      4.7 mmHg AV Mean Grad:      2.0 mmHg LVOT Vmax:         106.00 cm/s LVOT Vmean:        72.400 cm/s LVOT VTI:          0.167 m LVOT/AV VTI ratio: 1.16  AORTA Ao Root diam: 3.10 cm Ao Asc diam:  3.10 cm MITRAL VALVE MV Area (PHT): 4.36 cm    SHUNTS  MV Decel Time: 174 msec    Systemic VTI:  0.17 m MV E velocity: 46.70 cm/s  Systemic Diam: 2.10 cm MV A velocity: 99.80 cm/s MV E/A ratio:  0.47 Zoila Shutter MD Electronically signed by Zoila Shutter MD Signature Date/Time: 06/18/2022/12:09:40 PM    Final    EEG adult  Result Date: 06/17/2022 Charlsie Quest, MD     06/17/2022  8:59 AM Patient Name: BETTYANN PLILER MRN: [id number] Epilepsy Attending: Charlsie Quest Referring Physician/Provider: Marvel Plan, MD Date: 06/17/2022 Duration: 24 mins Patient history: 89 y.o. female with a PMHx of hypercalcemia, arthritis, HLD, HTN and sleep apnea who presents via EMS as a Code Stroke after acute onset of left sided weakness at home. EEG to evaluate for seizure. Level of alertness: Awake AEDs during EEG study: None Technical aspects: This EEG study was done with scalp electrodes positioned according to the 10-20 International system of electrode placement. Electrical activity was reviewed with band pass filter of 1-70Hz , sensitivity of 7 uV/mm, display speed of 18mm/sec with a 60Hz  notched filter applied as appropriate. EEG data were recorded continuously and digitally stored.  Video monitoring  was available and reviewed as appropriate. Description: The posterior dominant rhythm consists of 8 Hz activity of moderate voltage (25-35 uV) seen predominantly in posterior head regions, symmetric and reactive to eye opening and eye closing. Hyperventilation and photic stimulation were not performed.   IMPRESSION: This study is within normal limits. No seizures or epileptiform discharges were seen throughout the recording. A normal interictal EEG does not exclude the diagnosis of epilepsy. Charlsie Quest   MR BRAIN WO CONTRAST  Result Date: 06/17/2022 CLINICAL DATA:  89 year old female code stroke presentation yesterday. Right side subdural hematoma. Left side weakness. EXAM: MRI HEAD WITHOUT CONTRAST TECHNIQUE: Multiplanar, multiecho pulse sequences of the brain and surrounding structures were obtained without intravenous contrast. COMPARISON:  CT head, CTA head and neck yesterday. FINDINGS: Brain: Bilateral right greater than left small volume subdural hematoma. More extensive right side subdural hematoma is 3-4 mm in thickness (series 9, image 14 and series 6, image 26). More localized posterior superior left side subdural hematoma is up to 3 mm in thickness. No midline shift. No significant intracranial mass effect. Basilar cisterns remain normal. No intraventricular hemorrhage or ventriculomegaly. No restricted diffusion to suggest acute infarction. Cervicomedullary junction and pituitary are within normal limits. Patchy and scattered bilateral cerebral white matter T2 and FLAIR hyperintensity, mild to moderate for age. No cortical encephalomalacia or definite chronic cerebral blood products. Deep gray matter nuclei, brainstem and cerebellum are normal for age. Vascular: Major intracranial vascular flow voids are preserved with dominant appearing distal right vertebral artery. Skull and upper cervical spine: Negative for age visible cervical spine. Visualized bone marrow signal is within normal limits.  Sinuses/Orbits: Postoperative changes to both globes, otherwise negative orbits. Paranasal Visualized paranasal sinuses and mastoids are stable and well aerated. Other: Grossly normal visible internal auditory structures. No discrete scalp hematoma. IMPRESSION: 1. Positive for small Bilateral Subdural Hematomas, right side (3-4 mm) more extensive than left (up to 3 mm). No significant intracranial mass effect. 2. Negative for acute infarct and no other acute intracranial abnormality. Mild to moderate for age cerebral white matter signal changes most commonly due to chronic small vessel disease. Electronically Signed   By: Odessa Fleming M.D.   On: 06/17/2022 06:15   DG Knee Complete 4 Views Left  Result Date: 06/16/2022 CLINICAL DATA:  Fall EXAM: LEFT KNEE - COMPLETE 4  VIEW COMPARISON:  None Available. FINDINGS: No evidence of fracture, dislocation, or joint effusion. Calcific density within the joint space could be seen in the setting of CPPD arthropathy. Vascular calcifications. Suprapatellar enthesophyte. IMPRESSION: 1. No fracture or dislocation. 2. Findings usggestive of CPPD arthropathy. Electronically Signed   By: Lorenza Cambridge M.D.   On: 06/16/2022 16:10   CT ANGIO HEAD NECK W WO CM (CODE STROKE)  Result Date: 06/16/2022 CLINICAL DATA:  Neuro deficit, acute, stroke suspected. EXAM: CT ANGIOGRAPHY HEAD AND NECK WITH AND WITHOUT CONTRAST TECHNIQUE: Multidetector CT imaging of the head and neck was performed using the standard protocol during bolus administration of intravenous contrast. Multiplanar CT image reconstructions and MIPs were obtained to evaluate the vascular anatomy. Carotid stenosis measurements (when applicable) are obtained utilizing NASCET criteria, using the distal internal carotid diameter as the denominator. RADIATION DOSE REDUCTION: This exam was performed according to the departmental dose-optimization program which includes automated exposure control, adjustment of the mA and/or kV  according to patient size and/or use of iterative reconstruction technique. CONTRAST:  75mL OMNIPAQUE IOHEXOL 350 MG/ML SOLN COMPARISON:  Head CT 06/16/2022. FINDINGS: CTA NECK FINDINGS Aortic arch: Incompletely imaged. Visualized arch vessel origins are patent. Right carotid system: Irregular narrowing of the mid and distal right cervical ICA, consistent with fibromuscular dysplasia. Left carotid system: Irregular narrowing of the mid and distal left cervical ICA, consistent with fibromuscular dysplasia. Vertebral arteries: Right dominant vertebral artery. Both vessels are patent to the skull base. Skeleton: Extensive degenerative changes of the cervical spine with at least moderate stenosis at C5-6. Other neck: Unremarkable. Upper chest: Unremarkable. Review of the MIP images confirms the above findings CTA HEAD FINDINGS Anterior circulation: Intracranial ICAs are patent without stenosis or aneurysm. The proximal ACAs and MCAs are patent without stenosis or aneurysm. Distal branches are symmetric. Posterior circulation: The left vertebral artery functionally terminates in PICA. The right vertebral artery and basilar arteries are patent without stenosis or aneurysm. The SCAs, AICAs and PICAs are patent proximally. The PCAs are patent proximally without stenosis or aneurysm. Distal branches are symmetric. Venous sinuses: As permitted by contrast timing, patent. Anatomic variants: Persistent fetal origin of the left PCA with hypoplastic left P1 segment. Review of the MIP images confirms the above findings IMPRESSION: 1. No large vessel occlusion or significant stenosis of the head or neck vessels. 2. 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 Electronically Signed   By: Orvan Falconer M.D.   On: 06/16/2022 13:06   CT HEAD CODE STROKE WO CONTRAST`  Result Date: 06/16/2022 CLINICAL DATA:  Code stroke. Neuro deficit with acute stroke suspected EXAM: CT HEAD WITHOUT CONTRAST TECHNIQUE: Contiguous  axial images were obtained from the base of the skull through the vertex without intravenous contrast. RADIATION DOSE REDUCTION: This exam was performed according to the departmental dose-optimization program which includes automated exposure control, adjustment of the mA and/or kV according to patient size and/or use of iterative reconstruction technique. COMPARISON:  11/21/2020 FINDINGS: Brain: Thin isodense subdural collection along the bilateral cerebral convexity measuring up to 4 mm thickness on the right and 3 mm in the left, see coronal reformats. Chronic small vessel ischemia in the cerebral white matter. Generalized cerebral volume loss. Vascular: No hyperdense vessel or unexpected calcification. Skull: Normal. Negative for fracture or focal lesion. Sinuses/Orbits: No acute finding. Other: These results were called by telephone at the time of interpretation on 06/16/2022 at 12:40 pm to provider ERIC [employer] , who verbally acknowledged these results. ASPECTS [facility] Stroke  Program Early CT Score) Not scored given the above. IMPRESSION: 1. Nonacute/isodense subdural hematoma along the bilateral cerebral convexity measuring up to 4 mm in thickness on the right. 2. Chronic small vessel ischemia and atrophy. Electronically Signed   By: Tiburcio Pea M.D.   On: 06/16/2022 12:46     Final Assessment and Plan:   Reevaluated bedside.  Symptoms remain resolved during her observation window.  Had a shared medical decision making with patient patient's family.  Ultimately given resolution of symptoms they would like to be discharged to follow-up in the outpatient setting with neurology as scheduled.  She is already had a full meal has been ambulatory without difficulty. Given resolution of symptoms of believe patient stable for outpatient care management.  Underlying etiology remains nonspecific for her episodes of confusion and dizziness but focal seizures, TIA during on the differential.  She is already had an  extensive workup for these and can continue to follow-up in the outpatient setting with neurology with strict return precautions reinforced.  Disposition:  I have considered need for hospitalization, however, considering all of the above, I believe this patient is stable for discharge at this time.  Patient/family educated about specific return precautions for given chief complaint and symptoms.  Patient/family educated about follow-up with PCP.     Patient/family expressed understanding of return precautions and need for follow-up. Patient spoken to regarding all imaging and laboratory results and appropriate follow up for these results. All education provided in verbal form with additional information in written form. Time was allowed for answering of patient questions. Patient discharged.    Emergency Department Medication Summary:   Medications - No data to display         Clinical Impression:  1. Weakness      Discharge   Final Clinical Impression(s) / ED Diagnoses Final diagnoses:  Weakness    Rx / DC Orders ED Discharge Orders     None         Glyn Ade, MD 07/09/22 2122

## 2022-07-21 ENCOUNTER — Ambulatory Visit (INDEPENDENT_AMBULATORY_CARE_PROVIDER_SITE_OTHER): Payer: Medicare Other | Admitting: Neurology

## 2022-07-21 DIAGNOSIS — R569 Unspecified convulsions: Secondary | ICD-10-CM

## 2022-08-13 NOTE — Progress Notes (Signed)
Kindly inform the patient that EEG or brainwave study was normal

## 2022-08-17 ENCOUNTER — Telehealth: Payer: Self-pay

## 2022-08-17 NOTE — Telephone Encounter (Signed)
-----   Message from Micki Riley, MD sent at 08/13/2022  5:54 PM EDT ----- Amber Bolton inform the patient that EEG or brainwave study was normal.

## 2022-08-17 NOTE — Telephone Encounter (Signed)
Called patient and spoke with here daughter Nettie Elm and inform her per Dr. Lance Muss "Kindly inform the patient that EEG or brainwave study was normal. " Pt verbalized understanding. Pt had no questions at this time but was encouraged to call back if questions arise.

## 2022-11-05 ENCOUNTER — Observation Stay (HOSPITAL_COMMUNITY)
Admission: EM | Admit: 2022-11-05 | Discharge: 2022-11-09 | Disposition: A | Discharge status: Home / Self Care | Payer: Medicare Other | Attending: Internal Medicine | Admitting: Internal Medicine

## 2022-11-05 ENCOUNTER — Encounter (HOSPITAL_COMMUNITY): Payer: Self-pay | Admitting: Emergency Medicine

## 2022-11-05 ENCOUNTER — Emergency Department (HOSPITAL_COMMUNITY): Payer: Medicare Other

## 2022-11-05 ENCOUNTER — Other Ambulatory Visit: Payer: Self-pay

## 2022-11-05 DIAGNOSIS — Z96641 Presence of right artificial hip joint: Secondary | ICD-10-CM | POA: Diagnosis not present

## 2022-11-05 DIAGNOSIS — Z1152 Encounter for screening for COVID-19: Secondary | ICD-10-CM | POA: Diagnosis not present

## 2022-11-05 DIAGNOSIS — R55 Syncope and collapse: Principal | ICD-10-CM | POA: Diagnosis present

## 2022-11-05 DIAGNOSIS — R112 Nausea with vomiting, unspecified: Secondary | ICD-10-CM | POA: Diagnosis not present

## 2022-11-05 DIAGNOSIS — Z79899 Other long term (current) drug therapy: Secondary | ICD-10-CM | POA: Insufficient documentation

## 2022-11-05 DIAGNOSIS — R531 Weakness: Secondary | ICD-10-CM

## 2022-11-05 DIAGNOSIS — I5022 Chronic systolic (congestive) heart failure: Secondary | ICD-10-CM | POA: Insufficient documentation

## 2022-11-05 DIAGNOSIS — I130 Hypertensive heart and chronic kidney disease with heart failure and stage 1 through stage 4 chronic kidney disease, or unspecified chronic kidney disease: Secondary | ICD-10-CM | POA: Diagnosis not present

## 2022-11-05 DIAGNOSIS — N1832 Chronic kidney disease, stage 3b: Secondary | ICD-10-CM | POA: Diagnosis not present

## 2022-11-05 DIAGNOSIS — I5032 Chronic diastolic (congestive) heart failure: Secondary | ICD-10-CM | POA: Insufficient documentation

## 2022-11-05 DIAGNOSIS — E1122 Type 2 diabetes mellitus with diabetic chronic kidney disease: Secondary | ICD-10-CM | POA: Insufficient documentation

## 2022-11-05 DIAGNOSIS — Z23 Encounter for immunization: Secondary | ICD-10-CM | POA: Diagnosis not present

## 2022-11-05 DIAGNOSIS — K225 Diverticulum of esophagus, acquired: Secondary | ICD-10-CM | POA: Insufficient documentation

## 2022-11-05 DIAGNOSIS — S065XAA Traumatic subdural hemorrhage with loss of consciousness status unknown, initial encounter: Secondary | ICD-10-CM | POA: Diagnosis present

## 2022-11-05 HISTORY — DX: Transient cerebral ischemic attack, unspecified: G45.9

## 2022-11-05 HISTORY — DX: Type 2 diabetes mellitus without complications: E11.9

## 2022-11-05 HISTORY — DX: Traumatic subdural hemorrhage with loss of consciousness status unknown, initial encounter: S06.5XAA

## 2022-11-05 HISTORY — DX: Unspecified systolic (congestive) heart failure: I50.20

## 2022-11-05 LAB — URINALYSIS, ROUTINE W REFLEX MICROSCOPIC
Bacteria, UA: NONE SEEN
Bilirubin Urine: NEGATIVE
Glucose, UA: NEGATIVE mg/dL
Hgb urine dipstick: NEGATIVE
Ketones, ur: NEGATIVE mg/dL
Nitrite: NEGATIVE
Protein, ur: NEGATIVE mg/dL
Specific Gravity, Urine: 1.009 (ref 1.005–1.030)
pH: 7 (ref 5.0–8.0)

## 2022-11-05 LAB — COMPREHENSIVE METABOLIC PANEL
ALT: 14 U/L (ref 0–44)
AST: 17 U/L (ref 15–41)
Albumin: 3.1 g/dL — ABNORMAL LOW (ref 3.5–5.0)
Alkaline Phosphatase: 47 U/L (ref 38–126)
Anion gap: 13 (ref 5–15)
BUN: 21 mg/dL (ref 8–23)
CO2: 26 mmol/L (ref 22–32)
Calcium: 10.3 mg/dL (ref 8.9–10.3)
Chloride: 100 mmol/L (ref 98–111)
Creatinine, Ser: 1.25 mg/dL — ABNORMAL HIGH (ref 0.44–1.00)
GFR, Estimated: 41 mL/min — ABNORMAL LOW (ref >60)
Glucose, Bld: 170 mg/dL — ABNORMAL HIGH (ref 70–99)
Potassium: 3.8 mmol/L (ref 3.5–5.1)
Sodium: 139 mmol/L (ref 135–145)
Total Bilirubin: 0.3 mg/dL (ref 0.3–1.2)
Total Protein: 6.1 g/dL — ABNORMAL LOW (ref 6.5–8.1)

## 2022-11-05 LAB — CBC
HCT: 45.6 % (ref 36.0–46.0)
Hemoglobin: 14.9 g/dL (ref 12.0–15.0)
MCH: 29.9 pg (ref 26.0–34.0)
MCHC: 32.7 g/dL (ref 30.0–36.0)
MCV: 91.6 fL (ref 80.0–100.0)
Platelets: 182 10*3/uL (ref 150–400)
RBC: 4.98 MIL/uL (ref 3.87–5.11)
RDW: 11.9 % (ref 11.5–15.5)
WBC: 7.3 10*3/uL (ref 4.0–10.5)
nRBC: 0 % (ref 0.0–0.2)

## 2022-11-05 LAB — RESP PANEL BY RT-PCR (RSV, FLU A&B, COVID)  RVPGX2
Influenza A by PCR: NEGATIVE
Influenza B by PCR: NEGATIVE
Resp Syncytial Virus by PCR: NEGATIVE
SARS Coronavirus 2 by RT PCR: NEGATIVE

## 2022-11-05 LAB — TSH: TSH: 2.399 u[IU]/mL (ref 0.350–4.500)

## 2022-11-05 LAB — CBG MONITORING, ED: Glucose-Capillary: 167 mg/dL — ABNORMAL HIGH (ref 70–99)

## 2022-11-05 LAB — TROPONIN I (HIGH SENSITIVITY)
Troponin I (High Sensitivity): 8 ng/L (ref <18)
Troponin I (High Sensitivity): 9 ng/L (ref <18)

## 2022-11-05 LAB — BRAIN NATRIURETIC PEPTIDE: B Natriuretic Peptide: 45.5 pg/mL (ref 0.0–100.0)

## 2022-11-05 MED ORDER — ONDANSETRON HCL 4 MG/2ML IJ SOLN
4.0000 mg | Freq: Once | INTRAMUSCULAR | Status: AC
  Administered 2022-11-05: 4 mg via INTRAVENOUS
  Filled 2022-11-05: qty 2

## 2022-11-05 MED ORDER — ONDANSETRON 4 MG PO TBDP: 4.0000 mg | ORAL_TABLET | Freq: Three times a day (TID) | ORAL | 0 refills | Status: DC | PRN

## 2022-11-05 NOTE — Progress Notes (Signed)
PT consult pending. TOC will attempt to speak with family in the morning. Patient previously had [facility] for home health services.

## 2022-11-05 NOTE — ED Triage Notes (Signed)
Pt BIB EMS from home for generalized weakness, fatigue, and failure to thrive x2 weeks, states she "feels tired". Able to complete ADL's, family reports poor PO intake. Pt reports she still has an appetite but has not been eating as much. Ambulatory on scene.  114/68 86 RR 16 99% RA CBG 235 97.6

## 2022-11-05 NOTE — ED Provider Notes (Signed)
[facility] EMERGENC[facility] DEPARTMENT AT [facility] Provider Note   CSN: [id number] Arrival date & time: 11/05/22  1623     History  Chief Complaint  Patient presents with   Weakness    Failure to Thrive    Amber Bolton is a 89 y.o. female.  89 year old female with a history of hypertension, hyperlipidemia, diabetes, CHF (EF 35 to 40%), and bilateral subdural hematomas who presents to the emergency department with generalized weakness.  Reports that over the month has felt generally weak and has been eating less due to some persistent nausea.  No vomiting at all.  Says that when she eats it does help her to feel less weak.  Says that she occasionally feels lightheaded.  No new medications that she knows of.  Says that she was told by her family that she needed to come in to be evaluated.  Denies any runny nose, sore throat, fever, cough, shortness of breath, chest pain, diarrhea, dysuria or frequency.  Attempted to call daughter without success.        Home Medications Prior to Admission medications   Medication Sig Start Date End Date Taking? Authorizing Provider  ALPRAZolam Prudy Feeler) 0.5 MG tablet Take 0.25 mg by mouth at bedtime as needed for sleep.    Yes [provider]  amLODipine (NORVASC) 10 MG tablet Take 10 mg by mouth daily.   Yes [provider]  hydrochlorothiazide (HYDRODIURIL) 25 MG tablet Take 1 tablet (25 mg total) by mouth daily. 06/19/22  Yes Lynnae January, NP  levETIRAcetam (KEPPRA) 250 MG tablet Take 1 tablet (250 mg total) by mouth 2 (two) times daily. 06/18/22  Yes Hetty Blend C, NP  lisinopril (ZESTRIL) 40 MG tablet Take 1 tablet (40 mg total) by mouth daily. 06/19/22  Yes Hetty Blend C, NP  ondansetron (ZOFRAN-ODT) 4 MG disintegrating tablet Take 1 tablet (4 mg total) by mouth every 8 (eight) hours as needed for nausea or vomiting. 11/05/22  Yes Rondel Baton, MD  traMADol (ULTRAM) 50 MG tablet Take 50 mg by mouth 2 (two) times  daily as needed for moderate pain or severe pain.   Yes [provider]  ondansetron (ZOFRAN) 4 MG tablet Take 1 tablet (4 mg total) by mouth every 8 (eight) hours as needed for nausea or vomiting. 12/28/21   Darrick Grinder, PA-C  rosuvastatin (CRESTOR) 10 MG tablet Take 1 tablet (10 mg total) by mouth daily. 06/19/22   Lynnae January, NP  triamcinolone cream (KENALOG) 0.1 % Apply 1 Application topically daily as needed (rash).    [provider]      Allergies    Penicillins and Shellfish allergy    Review of Systems   Review of Systems  Physical Exam Updated Vital Signs BP (!) 141/74 (BP Location: Left Arm)   Pulse 88   Temp 98 F (36.7 C) (Oral)   Resp 18   Ht 4\' 11"  (1.499 m)   Wt 56.6 kg   SpO2 (!) 85%   BMI 25.20 kg/m  Physical Exam Vitals and nursing note reviewed.  Constitutional:      General: She is not in acute distress.    Appearance: She is well-developed.  HENT:     Head: Normocephalic and atraumatic.     Right Ear: External ear normal.     Left Ear: External ear normal.     Nose: Nose normal.  Eyes:     Extraocular Movements: Extraocular movements intact.  Conjunctiva/sclera: Conjunctivae normal.     Pupils: Pupils are equal, round, and reactive to light.  Cardiovascular:     Rate and Rhythm: Normal rate and regular rhythm.     Heart sounds: No murmur heard. Pulmonary:     Effort: Pulmonary effort is normal. No respiratory distress.     Breath sounds: Normal breath sounds.  Abdominal:     General: Abdomen is flat. There is no distension.     Palpations: Abdomen is soft. There is no mass.     Tenderness: There is no abdominal tenderness. There is no guarding.  Musculoskeletal:     Cervical back: Normal range of motion and neck supple.     Right lower leg: No edema.     Left lower leg: No edema.  Skin:    General: Skin is warm and dry.  Neurological:     Mental Status: She is alert and oriented to person, place, and time.  Mental status is at baseline.  Psychiatric:        Mood and Affect: Mood normal.     ED Results / Procedures / Treatments   Labs (all labs ordered are listed, but only abnormal results are displayed) Labs Reviewed  URINALYSIS, ROUTINE W REFLEX MICROSCOPIC - Abnormal; Notable for the following components:      Result Value   Leukocytes,Ua TRACE (*)    All other components within normal limits  COMPREHENSIVE METABOLIC PANEL - Abnormal; Notable for the following components:   Glucose, Bld 170 (*)    Creatinine, Ser 1.25 (*)    Total Protein 6.1 (*)    Albumin 3.1 (*)    GFR, Estimated 41 (*)    All other components within normal limits  CBG MONITORING, ED - Abnormal; Notable for the following components:   Glucose-Capillary 167 (*)    All other components within normal limits  RESP PANEL BY RT-PCR (RSV, FLU A&B, COVID)  RVPGX2  CBC  TSH  BRAIN NATRIURETIC PEPTIDE  TROPONIN I (HIGH SENSITIVITY)  TROPONIN I (HIGH SENSITIVITY)  TROPONIN I (HIGH SENSITIVITY)  TROPONIN I (HIGH SENSITIVITY)    EKG EKG Interpretation Date/Time:  Friday November 06 2022 06:59:12 EDT Ventricular Rate:  77 PR Interval:  173 QRS Duration:  144 QT Interval:  452 QTC Calculation: 512 R Axis:   -84  Text Interpretation: Sinus rhythm Atrial premature complex Probable left atrial enlargement Nonspecific IVCD with LAD Left ventricular hypertrophy Left bundle branch block Since last tracing of earlier today No significant change was found Confirmed by Elayne Snare ([id number]) on 11/06/2022 7:05:50 AM  Radiology CT ABDOMEN PELVIS W CONTRAST  Result Date: 11/06/2022 CLINICAL DATA:  Abdominal pain, vomiting and weakness. EXAM: CT ABDOMEN AND PELVIS WITH CONTRAST TECHNIQUE: Multidetector CT imaging of the abdomen and pelvis was performed using the standard protocol following bolus administration of intravenous contrast. RADIATION DOSE REDUCTION: This exam was performed according to the departmental  dose-optimization program which includes automated exposure control, adjustment of the mA and/or kV according to patient size and/or use of iterative reconstruction technique. CONTRAST:  75mL OMNIPAQUE IOHEXOL 350 MG/ML SOLN COMPARISON:  08/24/2021 FINDINGS: Lower chest: No acute abnormality. Hepatobiliary: No focal liver abnormality is seen. No gallstones, gallbladder wall thickening, or biliary dilatation. Pancreas: Unremarkable. No pancreatic ductal dilatation or surrounding inflammatory changes. Spleen: Normal in size without focal abnormality. Adrenals/Urinary Tract: Adrenal glands are unremarkable. Kidneys are normal, without renal calculi, focal lesion, or hydronephrosis. Bladder is unremarkable. Stomach/Bowel: Bowel shows no evidence of obstruction, ileus, inflammation  or lesion. The appendix is not discretely visualized. No free intraperitoneal air. Stable diverticulosis of the colon without evidence of acute diverticulitis. Vascular/Lymphatic: Atherosclerosis of the aorta noted with stable mild fusiform dilatation of the visualized distal thoracic aorta measuring up to 3.2 cm with associated slightly more prominent smoothly contoured anterior mural thrombus associated with atherosclerotic plaque. No evidence of abdominal aortic aneurysm. No lymphadenopathy identified. Reproductive: Status post hysterectomy. No adnexal masses. Other: No abdominal wall hernia or abnormality. No abdominopelvic ascites. Musculoskeletal: Stable diffuse degenerative disc disease throughout the spine with associated mild scoliosis. Stable appearance right hip arthroplasty IMPRESSION: 1. No acute findings in the abdomen or pelvis. 2. Stable mild fusiform dilatation of the visualized distal thoracic aorta measuring up to 3.2 cm with associated slightly more prominent smoothly contoured anterior mural thrombus associated with atherosclerotic plaque. 3. Stable diverticulosis of the colon without evidence of acute diverticulitis. 4.  Aortic atherosclerosis. Aortic Atherosclerosis (ICD10-I70.0). Electronically Signed   By: Irish Lack M.D.   On: 11/06/2022 08:43   DG Chest 2 View  Result Date: 11/05/2022 CLINICAL DATA:  Weakness.  Fatigue. EXAM: CHEST - 2 VIEW COMPARISON:  07/09/2022 FINDINGS: Chronic cardiomegaly. Unchanged mediastinal contours. Normal pulmonary vasculature. No pleural effusion, focal airspace disease, or pneumothorax. Chronic bilateral shoulder arthropathy. IMPRESSION: Chronic cardiomegaly. No acute chest findings. Electronically Signed   By: Narda Rutherford M.D.   On: 11/05/2022 19:47   CT Head Wo Contrast  Result Date: 11/05/2022 CLINICAL DATA:  history of sdh, n/v, weak EXAM: CT HEAD WITHOUT CONTRAST TECHNIQUE: Contiguous axial images were obtained from the base of the skull through the vertex without intravenous contrast. RADIATION DOSE REDUCTION: This exam was performed according to the departmental dose-optimization program which includes automated exposure control, adjustment of the mA and/or kV according to patient size and/or use of iterative reconstruction technique. COMPARISON:  Head CT 07/09/2022 FINDINGS: Brain: Chronic isodense subdural collections. On the right, this measures up to 4 mm and is unchanged, series 5, image 26. On the left this has slightly increased in size measuring 5 mm, series 5, image 19, previously 3 mm. No hyperdense or acute hemorrhagic component. No associated mass effect. Stable degree of atrophy and chronic small vessel ischemia. No evidence of acute ischemia. No hydrocephalus. Vascular: No hyperdense vessel or unexpected calcification. Skull: No fracture or focal lesion. Sinuses/Orbits: No acute finding.  Bilateral cataract resection. Other: None. IMPRESSION: 1. Chronic isodense subdural collections. On the right, this measures up to 4 mm and is unchanged from May exam. On the left this has slightly increased in size measuring 5 mm, previously 3 mm. No hyperdense or acute  hemorrhagic component. No associated mass effect. 2. Stable atrophy and chronic small vessel ischemia. Electronically Signed   By: Narda Rutherford M.D.   On: 11/05/2022 19:46    Procedures Procedures    Medications Ordered in ED Medications  levETIRAcetam (KEPPRA) tablet 250 mg (250 mg Oral Given 11/06/22 1001)  traMADol (ULTRAM) tablet 50 mg (has no administration in time range)  rosuvastatin (CRESTOR) tablet 10 mg (10 mg Oral Given 11/06/22 1000)  docusate sodium (COLACE) capsule 100 mg (100 mg Oral Given 11/06/22 1028)  polyethylene glycol (MIRALAX / GLYCOLAX) packet 17 g (has no administration in time range)  ondansetron (ZOFRAN) injection 4 mg (4 mg Intravenous Given 11/05/22 1825)  ondansetron (ZOFRAN) injection 4 mg (4 mg Intravenous Given 11/05/22 2134)  sodium chloride 0.9 % bolus 250 mL (0 mLs Intravenous Stopped 11/06/22 0949)  ondansetron (ZOFRAN) injection 4 mg (  4 mg Intravenous Given 11/06/22 0731)  iohexol (OMNIPAQUE) 350 MG/ML injection 75 mL (75 mLs Intravenous Contrast Given 11/06/22 0746)  lactated ringers bolus 500 mL (0 mLs Intravenous Stopped 11/06/22 1112)    ED Course/ Medical Decision Making/ A&P Clinical Course as of 11/06/22 1223  Thu Nov 05, 2022  1955 Creatinine(!): 1.25 At baseline [RP]  1956 CT Head Wo Contrast Chronic isodense subdural collections. On the right, this measures up to 4 mm and is unchanged from May exam. On the left this has slightly increased in size measuring 5 mm, previously 3 mm. [RP]    Clinical Course User Index [RP] Rondel Baton, MD                                 Medical Decision Making Amount and/or Complexity of Data Reviewed Labs: ordered. Decision-making details documented in ED Course. Radiology: ordered. Decision-making details documented in ED Course.  Risk Prescription drug management. Decision regarding hospitalization.   AYDIN SEFTON is a 89 y.o. female with comorbidities that complicate the patient evaluation  including hypertension, hyperlipidemia, diabetes, CHF (EF 35 to 40%), and bilateral subdural hematomas who presents to the emergency department with generalized weakness.    Initial Ddx:  Decreased p.o. intake, depression, dehydration, pneumonia, URI, UTI, chronic deconditioning, MI  MDM/Course:  Patient presents emergency department with a month of decreased p.o. intake and generalized weakness.  Denies any abdominal pain.  Does have some nausea but no vomiting.  No abdominal tenderness to palpation on exam.  Does not have any other infectious symptoms at this time.  Initially did have a broad differential for the patient which included occult infection, MI, and electrolyte abnormalities.  She had a workup for MRI that was unremarkable.  No signs of heart failure on her chest x-ray or BNP.  No pneumonia on her chest x-ray.  COVID and flu are negative.  No electrolyte abnormalities.  Did have a head CT which shows chronic subdural collections that appear to be grossly stable.   Was given some Zofran and was able to tolerate p.o. afterwards and was feeling better.  Her daughter came to the room and we discussed her current situation and they are concerned about her going home due to her deconditioning.  Will have physical therapy see her.  Patient places in ED border.  This patient presents to the ED for concern of complaints listed in HPI, this involves an extensive number of treatment options, and is a complaint that carries with it a high risk of complications and morbidity. Disposition including potential need for admission considered.   Dispo: TOC border  Additional history obtained from daughter Records reviewed Outpatient Clinic Notes The following labs were independently interpreted: Chemistry and show no acute abnormality I independently reviewed the following imaging with scope of interpretation limited to determining acute life threatening conditions related to emergency care: Chest x-ray  and agree with the radiologist interpretation with the following exceptions: none I personally reviewed and interpreted cardiac monitoring: normal sinus rhythm  I personally reviewed and interpreted the pt's EKG: see above for interpretation  I have reviewed the patients home medications and made adjustments as needed Consults: TOC Social Determinants of health:  Elderly         Final Clinical Impression(s) / ED Diagnoses Final diagnoses:  Weakness  Nausea and vomiting, unspecified vomiting type  Syncope, unspecified syncope type    Rx / DC  Orders ED Discharge Orders          Ordered    ondansetron (ZOFRAN-ODT) 4 MG disintegrating tablet  Every 8 hours PRN        11/05/22 2152              Rondel Baton, MD 11/06/22 1223

## 2022-11-06 ENCOUNTER — Emergency Department (HOSPITAL_COMMUNITY): Payer: Medicare Other

## 2022-11-06 ENCOUNTER — Encounter (HOSPITAL_COMMUNITY): Payer: Self-pay | Admitting: Internal Medicine

## 2022-11-06 DIAGNOSIS — R55 Syncope and collapse: Secondary | ICD-10-CM | POA: Diagnosis present

## 2022-11-06 DIAGNOSIS — R112 Nausea with vomiting, unspecified: Secondary | ICD-10-CM | POA: Insufficient documentation

## 2022-11-06 LAB — TROPONIN I (HIGH SENSITIVITY)
Troponin I (High Sensitivity): 9 ng/L (ref <18)
Troponin I (High Sensitivity): 6 ng/L (ref <18)

## 2022-11-06 MED ORDER — ROSUVASTATIN CALCIUM 5 MG PO TABS
10.0000 mg | ORAL_TABLET | Freq: Every day | ORAL | Status: DC
  Administered 2022-11-06 – 2022-11-09 (×4): 10 mg via ORAL
  Filled 2022-11-06 (×4): qty 2

## 2022-11-06 MED ORDER — LACTATED RINGERS IV BOLUS
500.0000 mL | Freq: Once | INTRAVENOUS | Status: AC
  Administered 2022-11-06: 500 mL via INTRAVENOUS

## 2022-11-06 MED ORDER — DOCUSATE SODIUM 100 MG PO CAPS
100.0000 mg | ORAL_CAPSULE | Freq: Two times a day (BID) | ORAL | Status: DC
  Administered 2022-11-06 – 2022-11-09 (×7): 100 mg via ORAL
  Filled 2022-11-06 (×7): qty 1

## 2022-11-06 MED ORDER — SODIUM CHLORIDE 0.9 % IV BOLUS
250.0000 mL | Freq: Once | INTRAVENOUS | Status: AC
  Administered 2022-11-06: 250 mL via INTRAVENOUS

## 2022-11-06 MED ORDER — ONDANSETRON 4 MG PO TBDP
4.0000 mg | ORAL_TABLET | Freq: Three times a day (TID) | ORAL | Status: DC | PRN
  Administered 2022-11-06: 4 mg via ORAL
  Filled 2022-11-06: qty 1

## 2022-11-06 MED ORDER — POLYETHYLENE GLYCOL 3350 17 G PO PACK: 17.0000 g | PACK | Freq: Every day | ORAL | Status: DC | PRN

## 2022-11-06 MED ORDER — ONDANSETRON HCL 4 MG/2ML IJ SOLN
4.0000 mg | Freq: Once | INTRAMUSCULAR | Status: AC
  Administered 2022-11-06: 4 mg via INTRAVENOUS
  Filled 2022-11-06: qty 2

## 2022-11-06 MED ORDER — TRAMADOL HCL 50 MG PO TABS: 50.0000 mg | ORAL_TABLET | Freq: Two times a day (BID) | ORAL | Status: DC | PRN

## 2022-11-06 MED ORDER — LEVETIRACETAM 250 MG PO TABS
250.0000 mg | ORAL_TABLET | Freq: Two times a day (BID) | ORAL | Status: DC
  Administered 2022-11-06 – 2022-11-09 (×7): 250 mg via ORAL
  Filled 2022-11-06 (×8): qty 1

## 2022-11-06 MED ORDER — IOHEXOL 350 MG/ML SOLN
75.0000 mL | Freq: Once | INTRAVENOUS | Status: AC | PRN
  Administered 2022-11-06: 75 mL via INTRAVENOUS

## 2022-11-06 MED ORDER — PNEUMOCOCCAL 20-VAL CONJ VACC 0.5 ML IM SUSY
0.5000 mL | PREFILLED_SYRINGE | INTRAMUSCULAR | Status: AC
  Administered 2022-11-07: 0.5 mL via INTRAMUSCULAR
  Filled 2022-11-06: qty 0.5

## 2022-11-06 NOTE — ED Notes (Signed)
NAD noted at this time, pt resting in bed, respirations even and unlabored, skin warm and dry, bed in low position, call light within reach.  Will continue to monitor.

## 2022-11-06 NOTE — H&P (Incomplete)
Date: 11/06/2022         Patient Name:  Amber Bolton MRN: [id number]  DOB: [birth year] Age / Sex: 89 y.o., female   PCP: Judd Lien, PA-C         Medical Service: Internal Medicine Teaching Service         Attending Physician: Dr. Theresia Lo, Cecile Sheerer, DO    First Contact: Dr. Marland Kitchen Pager: [phone]  Second Contact: Dr. Marland Kitchen Pager: [phone]       After Hours (After 5p/  First Contact Pager: [phone]  weekends / holidays): Second Contact Pager: [phone]   Chief Concern: Nausea and dizziness.  History of Present Illness: Yesterday became dizzy, somewhat sick to her stomach. She was moving around her bedroom when this happened. Did not fall. Sat down to rest. Abdominal pain is unusual for her. Daughter is a Engineer, civil (consulting) and recommended going to emergency department via ambulance.   Review of Systems  Constitutional:  Negative for fever.  Respiratory:  Negative for cough.   Cardiovascular:  Negative for chest pain.  Psychiatric/Behavioral:  Positive for memory loss.   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   Past Medical History:  Diagnosis Date   Arthritis    IN RIGHT HIP AND FINGERS AND BACK   Hypercalcemia    Hyperlipemia    Resistant hypertension 03/09/2022   Sleep apnea    STATES SHE HAS CPAP - BUT HAS NOT USED IN THE LAST 2 YRS   ***  Medications: Current Outpatient Medications  Medication Instructions   ALPRAZolam (XANAX) 0.25 mg, Oral, At bedtime PRN   amLODipine (NORVASC) 10 mg, Oral, Daily   hydrochlorothiazide (HYDRODIURIL) 25 mg, Oral, Daily   levETIRAcetam (KEPPRA) 250 mg, Oral, 2 times daily   lisinopril (ZESTRIL) 40 mg,  Oral, Daily   ondansetron (ZOFRAN) 4 mg, Oral, Every 8 hours PRN   ondansetron (ZOFRAN-ODT) 4 mg, Oral, Every 8 hours PRN   rosuvastatin (CRESTOR) 10 mg, Oral, Daily   traMADol (ULTRAM) 50 mg, Oral, 2 times daily PRN   triamcinolone cream (KENALOG) 0.1 % 1 Application, Topical, Daily PRN   ***  Surgical History: Past Surgical History:  Procedure Laterality Date   ABDOMINAL HYSTERECTOMY  1978   HEMORRHOID SURGERY  1960'S   TOTAL HIP ARTHROPLASTY Right 08/23/2012   Procedure: RIGHT TOTAL HIP ARTHROPLASTY ANTERIOR APPROACH;  Surgeon: [name], MD;  Location: [facility];  Service: Orthopedics;  Laterality: Right;   ***  Family History:  History reviewed. No pertinent family history. ***  Social History:  Social History   Socioeconomic History   Marital status: Married    Spouse name: Not on file   Number of children: Not on file   Years of education: Not on file   Highest education level: Not on file  Occupational History   Not on file  Tobacco Use   Smoking status: Never   Smokeless tobacco: Never  Vaping Use   Vaping status: Never Used  Substance and Sexual Activity   Alcohol use: No   Drug use: No   Sexual activity: Not on file  Other Topics Concern  Not on file  Social History Narrative   Not on file   Social Determinants of Health   Financial Resource Strain: Not on file  Food Insecurity: No Food Insecurity (06/17/2022)   Hunger Vital Sign    Worried About Running Out of Food in the Last Year: Never true    Ran Out of Food in the Last Year: Never true  Transportation Needs: No Transportation Needs (06/17/2022)   PRAPARE - Administrator, Civil Service (Medical): No    Lack of Transportation (Non-Medical): No  Physical Activity: Not on file  Stress: Not on file  Social Connections: Not on file  Intimate Partner Violence: Not At Risk (06/17/2022)   Humiliation, Afraid, Rape, and Kick questionnaire    Fear of Current or Ex-Partner: No     Emotionally Abused: No    Physically Abused: No    Sexually Abused: No   ***  Physical Exam: Blood pressure 134/78, pulse 66, temperature 98.3 F (36.8 C), temperature source Oral, resp. rate 13, height 4\' 11"  (1.499 m), weight 56.6 kg, SpO2 100%.  ***  EKG:  ***  Labs: ***  Images and other studies: ***  Assessment & Plan:  BRECKYN WENSTROM is a 89 y.o. ***  Active Problems:   * No active hospital problems. *  ***  Level of care: *** Diet: *** IVF: *** VTE:  Code: *** Surrogate: ***  Signed: Marrianne Mood MD 11/06/2022, 9:30 AM

## 2022-11-06 NOTE — ED Notes (Addendum)
Assumed care of patient, NAD noted at this time, pt resting in bed, respirations even and unlabored, skin warm and dry, bed in low position, call light within reach. Comfort measures offered. Will continue to monitor.

## 2022-11-06 NOTE — ED Notes (Signed)
ED TO INPATIENT HANDOFF REPORT  ED Nurse Name and Phone #: [phone]  S Name/Age/Gender Amber Bolton 89 y.o. female Room/Bed: 034C/034C  Code Status   Code Status: Do not attempt resuscitation (DNR) PRE-ARREST INTERVENTIONS DESIRED  Home/SNF/Other Home Patient oriented to: self, place, time, and situation Is this baseline? Yes   Triage Complete: Triage complete  Chief Complaint Syncope [R55]  Triage Note Pt BIB EMS from home for generalized weakness, fatigue, and failure to thrive x2 weeks, states she "feels tired". Able to complete ADL's, family reports poor PO intake. Pt reports she still has an appetite but has not been eating as much. Ambulatory on scene.  114/68 86 RR 16 99% RA CBG 235 97.6    Allergies Allergies  Allergen Reactions   Penicillins Rash   Shellfish Allergy Itching    Mouth itches    Level of Care/Admitting Diagnosis ED Disposition     ED Disposition  Admit   Condition  --   Comment  Hospital Area: [facility] [[id number]]  Level of Care: Telemetry Medical [104]  May place patient in observation at [facility] or [facility] if equivalent level of care is available:: No  Covid Evaluation: Confirmed COVID Negative  Diagnosis: Syncope [206001]  Admitting Physician: Inez Catalina [phone]  Attending Physician: Inez Catalina [phone]          B Medical/Surgery History Past Medical History:  Diagnosis Date   Arthritis    IN RIGHT HIP AND FINGERS AND BACK   Hypercalcemia    Hyperlipemia    Resistant hypertension 03/09/2022   Sleep apnea    STATES SHE HAS CPAP - BUT HAS NOT USED IN THE LAST 2 YRS   Past Surgical History:  Procedure Laterality Date   ABDOMINAL HYSTERECTOMY  1978   HEMORRHOID SURGERY  1960'S   TOTAL HIP ARTHROPLASTY Right 08/23/2012   Procedure: RIGHT TOTAL HIP ARTHROPLASTY ANTERIOR APPROACH;  Surgeon: [name], MD;  Location: [facility];  Service: Orthopedics;  Laterality: Right;     A IV  Location/Drains/Wounds Patient Lines/Drains/Airways Status     Active Line/Drains/Airways     Name Placement date Placement time Site Days   Peripheral IV 11/05/22 20 G Anterior;Right Forearm 11/05/22  1800  Forearm  1            Intake/Output Last 24 hours  Intake/Output Summary (Last 24 hours) at 11/06/2022 1023 Last data filed at 11/06/2022 9629 Gross per 24 hour  Intake 250 ml  Output --  Net 250 ml    Labs/Imaging Results for orders placed or performed during the hospital encounter of 11/05/22 (from the past 48 hour(s))  CBG monitoring, ED     Status: Abnormal   Collection Time: 11/05/22  5:48 PM  Result Value Ref Range   Glucose-Capillary 167 (H) 70 - 99 mg/dL    Comment: Glucose reference range applies only to samples taken after fasting for at least 8 hours.  CBC     Status: None   Collection Time: 11/05/22  5:53 PM  Result Value Ref Range   WBC 7.3 4.0 - 10.5 K/uL   RBC 4.98 3.87 - 5.11 MIL/uL   Hemoglobin 14.9 12.0 - 15.0 g/dL   HCT 52.8 41.3 - 24.4 %   MCV 91.6 80.0 - 100.0 fL   MCH 29.9 26.0 - 34.0 pg   MCHC 32.7 30.0 - 36.0 g/dL   RDW 01.0 27.2 - 53.6 %   Platelets 182 150 - 400  K/uL   nRBC 0.0 0.0 - 0.2 %    Comment: Performed at [facility], [address] [place], [place] [address]  TSH     Status: None   Collection Time: 11/05/22  5:53 PM  Result Value Ref Range   TSH 2.399 0.350 - 4.500 uIU/mL    Comment: Performed by a 3rd Generation assay with a functional sensitivity of <=0.01 uIU/mL. Performed at [facility], [address], [place], [place] [address]   Troponin I (High Sensitivity)     Status: None   Collection Time: 11/05/22  5:53 PM  Result Value Ref Range   Troponin I (High Sensitivity) 8 <18 ng/L    Comment: (NOTE) Elevated high sensitivity troponin I (hsTnI) values and significant  changes across serial measurements may suggest ACS but many other  chronic and acute conditions are known to elevate hsTnI results.  Refer  to the "Links" section for chest pain algorithms and additional  guidance. Performed at [facility], [address] [place], [place] [address]   Comprehensive metabolic panel     Status: Abnormal   Collection Time: 11/05/22  5:53 PM  Result Value Ref Range   Sodium 139 135 - 145 mmol/L   Potassium 3.8 3.5 - 5.1 mmol/L   Chloride 100 98 - 111 mmol/L   CO2 26 22 - 32 mmol/L   Glucose, Bld 170 (H) 70 - 99 mg/dL    Comment: Glucose reference range applies only to samples taken after fasting for at least 8 hours.   BUN 21 8 - 23 mg/dL   Creatinine, Ser 8.84 (H) 0.44 - 1.00 mg/dL   Calcium 16.6 8.9 - 06.3 mg/dL   Total Protein 6.1 (L) 6.5 - 8.1 g/dL   Albumin 3.1 (L) 3.5 - 5.0 g/dL   AST 17 15 - 41 U/L   ALT 14 0 - 44 U/L   Alkaline Phosphatase 47 38 - 126 U/L   Total Bilirubin 0.3 0.3 - 1.2 mg/dL   GFR, Estimated 41 (L) >60 mL/min    Comment: (NOTE) Calculated using the CKD-EPI Creatinine Equation (2021)    Anion gap 13 5 - 15    Comment: Performed at [facility], [address], [place], [place] [address]  Urinalysis, Routine w reflex microscopic -Urine, Clean Catch     Status: Abnormal   Collection Time: 11/05/22  8:30 PM  Result Value Ref Range   Color, Urine YELLOW YELLOW   APPearance CLEAR CLEAR   Specific Gravity, Urine 1.009 1.005 - 1.030   pH 7.0 5.0 - 8.0   Glucose, UA NEGATIVE NEGATIVE mg/dL   Hgb urine dipstick NEGATIVE NEGATIVE   Bilirubin Urine NEGATIVE NEGATIVE   Ketones, ur NEGATIVE NEGATIVE mg/dL   Protein, ur NEGATIVE NEGATIVE mg/dL   Nitrite NEGATIVE NEGATIVE   Leukocytes,Ua TRACE (A) NEGATIVE   RBC / HPF 0-5 0 - 5 RBC/hpf   WBC, UA 0-5 0 - 5 WBC/hpf   Bacteria, UA NONE SEEN NONE SEEN   Squamous Epithelial / HPF 0-5 0 - 5 /HPF    Comment: Performed at [facility], [address] [place], [place] [address]  Resp panel by RT-PCR (RSV, Flu A&B, Covid) Anterior Nasal Swab     Status: None   Collection Time: 11/05/22  8:30 PM    Specimen: Anterior Nasal Swab  Result Value Ref Range   SARS Coronavirus 2 by RT PCR NEGATIVE NEGATIVE   Influenza A  by PCR NEGATIVE NEGATIVE   Influenza B by PCR NEGATIVE NEGATIVE    Comment: (NOTE) The Xpert Xpress SARS-CoV-2/FLU/RSV plus assay is intended as an aid in the diagnosis of influenza from Nasopharyngeal swab specimens and should not be used as a sole basis for treatment. Nasal washings and aspirates are unacceptable for Xpert Xpress SARS-CoV-2/FLU/RSV testing.  Fact Sheet for Patients: BloggerCourse.com  Fact Sheet for Healthcare Providers: SeriousBroker.it  This test is not yet approved or cleared by the [place] FDA and has been authorized for detection and/or diagnosis of SARS-CoV-2 by FDA under an Emergency Use Authorization (EUA). This EUA will remain in effect (meaning this test can be used) for the duration of the COVID-19 declaration under Section 564(b)(1) of the Act, 21 U.S.C. section 360bbb-3(b)(1), unless the authorization is terminated or revoked.     Resp Syncytial Virus by PCR NEGATIVE NEGATIVE    Comment: (NOTE) Fact Sheet for Patients: BloggerCourse.com  Fact Sheet for Healthcare Providers: SeriousBroker.it  This test is not yet approved or cleared by the [place] FDA and has been authorized for detection and/or diagnosis of SARS-CoV-2 by FDA under an Emergency Use Authorization (EUA). This EUA will remain in effect (meaning this test can be used) for the duration of the COVID-19 declaration under Section 564(b)(1) of the Act, 21 U.S.C. section 360bbb-3(b)(1), unless the authorization is terminated or revoked.  Performed at [facility], [address], [place], [place] [address]   Brain natriuretic peptide     Status: None   Collection Time: 11/05/22  8:30 PM  Result Value Ref Range   B Natriuretic Peptide 45.5 0.0 - 100.0  pg/mL    Comment: Performed at [facility], [address], [place], [place] [address]  Troponin I (High Sensitivity)     Status: None   Collection Time: 11/05/22  8:30 PM  Result Value Ref Range   Troponin I (High Sensitivity) 9 <18 ng/L    Comment: (NOTE) Elevated high sensitivity troponin I (hsTnI) values and significant  changes across serial measurements may suggest ACS but many other  chronic and acute conditions are known to elevate hsTnI results.  Refer to the "Links" section for chest pain algorithms and additional  guidance. Performed at [facility], [address] [place], [place] [address]   Troponin I (High Sensitivity)     Status: None   Collection Time: 11/06/22  7:34 AM  Result Value Ref Range   Troponin I (High Sensitivity) 9 <18 ng/L    Comment: (NOTE) Elevated high sensitivity troponin I (hsTnI) values and significant  changes across serial measurements may suggest ACS but many other  chronic and acute conditions are known to elevate hsTnI results.  Refer to the "Links" section for chest pain algorithms and additional  guidance. Performed at [facility], [address], [place], [place] [address]    CT ABDOMEN PELVIS W CONTRAST  Result Date: 11/06/2022 CLINICAL DATA:  Abdominal pain, vomiting and weakness. EXAM: CT ABDOMEN AND PELVIS WITH CONTRAST TECHNIQUE: Multidetector CT imaging of the abdomen and pelvis was performed using the standard protocol following bolus administration of intravenous contrast. RADIATION DOSE REDUCTION: This exam was performed according to the departmental dose-optimization program which includes automated exposure control, adjustment of the mA and/or kV according to patient size and/or use of iterative reconstruction technique. CONTRAST:  75mL OMNIPAQUE IOHEXOL 350 MG/ML SOLN COMPARISON:  08/24/2021 FINDINGS: Lower chest: No acute abnormality. Hepatobiliary: No focal liver abnormality is  seen. No gallstones,  gallbladder wall thickening, or biliary dilatation. Pancreas: Unremarkable. No pancreatic ductal dilatation or surrounding inflammatory changes. Spleen: Normal in size without focal abnormality. Adrenals/Urinary Tract: Adrenal glands are unremarkable. Kidneys are normal, without renal calculi, focal lesion, or hydronephrosis. Bladder is unremarkable. Stomach/Bowel: Bowel shows no evidence of obstruction, ileus, inflammation or lesion. The appendix is not discretely visualized. No free intraperitoneal air. Stable diverticulosis of the colon without evidence of acute diverticulitis. Vascular/Lymphatic: Atherosclerosis of the aorta noted with stable mild fusiform dilatation of the visualized distal thoracic aorta measuring up to 3.2 cm with associated slightly more prominent smoothly contoured anterior mural thrombus associated with atherosclerotic plaque. No evidence of abdominal aortic aneurysm. No lymphadenopathy identified. Reproductive: Status post hysterectomy. No adnexal masses. Other: No abdominal wall hernia or abnormality. No abdominopelvic ascites. Musculoskeletal: Stable diffuse degenerative disc disease throughout the spine with associated mild scoliosis. Stable appearance right hip arthroplasty IMPRESSION: 1. No acute findings in the abdomen or pelvis. 2. Stable mild fusiform dilatation of the visualized distal thoracic aorta measuring up to 3.2 cm with associated slightly more prominent smoothly contoured anterior mural thrombus associated with atherosclerotic plaque. 3. Stable diverticulosis of the colon without evidence of acute diverticulitis. 4. Aortic atherosclerosis. Aortic Atherosclerosis (ICD10-I70.0). Electronically Signed   By: Irish Lack M.D.   On: 11/06/2022 08:43   DG Chest 2 View  Result Date: 11/05/2022 CLINICAL DATA:  Weakness.  Fatigue. EXAM: CHEST - 2 VIEW COMPARISON:  07/09/2022 FINDINGS: Chronic cardiomegaly. Unchanged mediastinal contours. Normal pulmonary vasculature. No  pleural effusion, focal airspace disease, or pneumothorax. Chronic bilateral shoulder arthropathy. IMPRESSION: Chronic cardiomegaly. No acute chest findings. Electronically Signed   By: Narda Rutherford M.D.   On: 11/05/2022 19:47   CT Head Wo Contrast  Result Date: 11/05/2022 CLINICAL DATA:  history of sdh, n/v, weak EXAM: CT HEAD WITHOUT CONTRAST TECHNIQUE: Contiguous axial images were obtained from the base of the skull through the vertex without intravenous contrast. RADIATION DOSE REDUCTION: This exam was performed according to the departmental dose-optimization program which includes automated exposure control, adjustment of the mA and/or kV according to patient size and/or use of iterative reconstruction technique. COMPARISON:  Head CT 07/09/2022 FINDINGS: Brain: Chronic isodense subdural collections. On the right, this measures up to 4 mm and is unchanged, series 5, image 26. On the left this has slightly increased in size measuring 5 mm, series 5, image 19, previously 3 mm. No hyperdense or acute hemorrhagic component. No associated mass effect. Stable degree of atrophy and chronic small vessel ischemia. No evidence of acute ischemia. No hydrocephalus. Vascular: No hyperdense vessel or unexpected calcification. Skull: No fracture or focal lesion. Sinuses/Orbits: No acute finding.  Bilateral cataract resection. Other: None. IMPRESSION: 1. Chronic isodense subdural collections. On the right, this measures up to 4 mm and is unchanged from May exam. On the left this has slightly increased in size measuring 5 mm, previously 3 mm. No hyperdense or acute hemorrhagic component. No associated mass effect. 2. Stable atrophy and chronic small vessel ischemia. Electronically Signed   By: Narda Rutherford M.D.   On: 11/05/2022 19:46    Pending Labs Unresulted Labs (From admission, onward)    None       Vitals/Pain Today's Vitals   11/05/22 2000 11/05/22 2200 11/05/22 2315 11/05/22 2345  BP: 133/85 134/78     Pulse: 66 86 64 66  Resp: 13     Temp:      TempSrc:      SpO2: 100%  96% 100%  Weight:      Height:      PainSc:        Isolation Precautions No active isolations  Medications Medications  levETIRAcetam (KEPPRA) tablet 250 mg (250 mg Oral Given 11/06/22 1001)  traMADol (ULTRAM) tablet 50 mg (has no administration in time range)  rosuvastatin (CRESTOR) tablet 10 mg (10 mg Oral Given 11/06/22 1000)  docusate sodium (COLACE) capsule 100 mg (has no administration in time range)  polyethylene glycol (MIRALAX / GLYCOLAX) packet 17 g (has no administration in time range)  lactated ringers bolus 500 mL (has no administration in time range)  ondansetron (ZOFRAN) injection 4 mg (4 mg Intravenous Given 11/05/22 1825)  ondansetron (ZOFRAN) injection 4 mg (4 mg Intravenous Given 11/05/22 2134)  sodium chloride 0.9 % bolus 250 mL (0 mLs Intravenous Stopped 11/06/22 0949)  ondansetron (ZOFRAN) injection 4 mg (4 mg Intravenous Given 11/06/22 0731)  iohexol (OMNIPAQUE) 350 MG/ML injection 75 mL (75 mLs Intravenous Contrast Given 11/06/22 0746)    Mobility walks with device (not able to ambulate at this time due generalize weakness     Focused Assessments Cardiac Assessment Handoff:    Lab Results  Component Value Date   CKTOTAL 30 (L) 07/09/2022   No results found for: "DDIMER" Does the Patient currently have chest pain? No    R Recommendations: See Admitting Provider Note  Report given to:   Additional Notes:

## 2022-11-06 NOTE — ED Notes (Signed)
Pt co nausea and lower abdominal pain, reports that the nausea is in the "neck" area and that when she gets hungry she gets nauseated, reports that she "just don't feel good". Dr Blinda Leatherwood has been made aware of same.

## 2022-11-06 NOTE — ED Provider Notes (Addendum)
Patient initially seen for failure to thrive, nausea yesterday by Dr. Jarold Motto.  Patient was boarding for physical therapy and possible placement.  Overnight she has had persistent nausea, retching.  Was seen by Dr. Oletta Cohn who had placed Zofran orders.  I was called by nursing to reevaluate patient.  Patient actively dry heaving, nursing states patient had a syncopal episode.  They apparently went to set her up when she said she felt lightheaded and passed out according nursing staff.  When I saw patient at bedside she was awake, alert however stated she did not feel good.  Dry heaving.  Discussed with nursing staff to bring patient back to main ED for further workup and treatment.    Physical Exam  BP 107/77   Pulse 78   Temp 98.2 F (36.8 C) (Oral)   Resp 13   Ht 4\' 11"  (1.499 m)   Wt 56.6 kg   SpO2 96%   BMI 25.20 kg/m   Physical Exam Vitals and nursing note reviewed.  Constitutional:      General: She is not in acute distress.    Appearance: She is well-developed. She is not ill-appearing.     Comments: Actively dry heaving in room  HENT:     Head: Atraumatic.  Eyes:     Pupils: Pupils are equal, round, and reactive to light.  Cardiovascular:     Rate and Rhythm: Normal rate.     Pulses: Normal pulses.     Heart sounds: Normal heart sounds.  Pulmonary:     Effort: Pulmonary effort is normal. No respiratory distress.     Breath sounds: Normal breath sounds.  Abdominal:     General: Bowel sounds are normal. There is no distension.     Palpations: Abdomen is soft.     Tenderness: There is no abdominal tenderness. There is no guarding or rebound.  Musculoskeletal:        General: No swelling or tenderness. Normal range of motion.     Cervical back: Normal range of motion.  Skin:    General: Skin is warm and dry.     Capillary Refill: Capillary refill takes less than 2 seconds.  Neurological:     General: No focal deficit present.     Mental Status: She is alert.      Cranial Nerves: No cranial nerve deficit.     Motor: No weakness.  Psychiatric:        Mood and Affect: Mood normal.     Procedures  Procedures Labs Reviewed  URINALYSIS, ROUTINE W REFLEX MICROSCOPIC - Abnormal; Notable for the following components:      Result Value   Leukocytes,Ua TRACE (*)    All other components within normal limits  COMPREHENSIVE METABOLIC PANEL - Abnormal; Notable for the following components:   Glucose, Bld 170 (*)    Creatinine, Ser 1.25 (*)    Total Protein 6.1 (*)    Albumin 3.1 (*)    GFR, Estimated 41 (*)    All other components within normal limits  CBG MONITORING, ED - Abnormal; Notable for the following components:   Glucose-Capillary 167 (*)    All other components within normal limits  RESP PANEL BY RT-PCR (RSV, FLU A&B, COVID)  RVPGX2  CBC  TSH  BRAIN NATRIURETIC PEPTIDE  TROPONIN I (HIGH SENSITIVITY)  TROPONIN I (HIGH SENSITIVITY)  TROPONIN I (HIGH SENSITIVITY)  TROPONIN I (HIGH SENSITIVITY)   CT ABDOMEN PELVIS W CONTRAST  Result Date: 11/06/2022 CLINICAL DATA:  Abdominal  pain, vomiting and weakness. EXAM: CT ABDOMEN AND PELVIS WITH CONTRAST TECHNIQUE: Multidetector CT imaging of the abdomen and pelvis was performed using the standard protocol following bolus administration of intravenous contrast. RADIATION DOSE REDUCTION: This exam was performed according to the departmental dose-optimization program which includes automated exposure control, adjustment of the mA and/or kV according to patient size and/or use of iterative reconstruction technique. CONTRAST:  75mL OMNIPAQUE IOHEXOL 350 MG/ML SOLN COMPARISON:  08/24/2021 FINDINGS: Lower chest: No acute abnormality. Hepatobiliary: No focal liver abnormality is seen. No gallstones, gallbladder wall thickening, or biliary dilatation. Pancreas: Unremarkable. No pancreatic ductal dilatation or surrounding inflammatory changes. Spleen: Normal in size without focal abnormality. Adrenals/Urinary Tract:  Adrenal glands are unremarkable. Kidneys are normal, without renal calculi, focal lesion, or hydronephrosis. Bladder is unremarkable. Stomach/Bowel: Bowel shows no evidence of obstruction, ileus, inflammation or lesion. The appendix is not discretely visualized. No free intraperitoneal air. Stable diverticulosis of the colon without evidence of acute diverticulitis. Vascular/Lymphatic: Atherosclerosis of the aorta noted with stable mild fusiform dilatation of the visualized distal thoracic aorta measuring up to 3.2 cm with associated slightly more prominent smoothly contoured anterior mural thrombus associated with atherosclerotic plaque. No evidence of abdominal aortic aneurysm. No lymphadenopathy identified. Reproductive: Status post hysterectomy. No adnexal masses. Other: No abdominal wall hernia or abnormality. No abdominopelvic ascites. Musculoskeletal: Stable diffuse degenerative disc disease throughout the spine with associated mild scoliosis. Stable appearance right hip arthroplasty IMPRESSION: 1. No acute findings in the abdomen or pelvis. 2. Stable mild fusiform dilatation of the visualized distal thoracic aorta measuring up to 3.2 cm with associated slightly more prominent smoothly contoured anterior mural thrombus associated with atherosclerotic plaque. 3. Stable diverticulosis of the colon without evidence of acute diverticulitis. 4. Aortic atherosclerosis. Aortic Atherosclerosis (ICD10-I70.0). Electronically Signed   By: Irish Lack M.D.   On: 11/06/2022 08:43   DG Chest 2 View  Result Date: 11/05/2022 CLINICAL DATA:  Weakness.  Fatigue. EXAM: CHEST - 2 VIEW COMPARISON:  07/09/2022 FINDINGS: Chronic cardiomegaly. Unchanged mediastinal contours. Normal pulmonary vasculature. No pleural effusion, focal airspace disease, or pneumothorax. Chronic bilateral shoulder arthropathy. IMPRESSION: Chronic cardiomegaly. No acute chest findings. Electronically Signed   By: Narda Rutherford M.D.   On:  11/05/2022 19:47   CT Head Wo Contrast  Result Date: 11/05/2022 CLINICAL DATA:  history of sdh, n/v, weak EXAM: CT HEAD WITHOUT CONTRAST TECHNIQUE: Contiguous axial images were obtained from the base of the skull through the vertex without intravenous contrast. RADIATION DOSE REDUCTION: This exam was performed according to the departmental dose-optimization program which includes automated exposure control, adjustment of the mA and/or kV according to patient size and/or use of iterative reconstruction technique. COMPARISON:  Head CT 07/09/2022 FINDINGS: Brain: Chronic isodense subdural collections. On the right, this measures up to 4 mm and is unchanged, series 5, image 26. On the left this has slightly increased in size measuring 5 mm, series 5, image 19, previously 3 mm. No hyperdense or acute hemorrhagic component. No associated mass effect. Stable degree of atrophy and chronic small vessel ischemia. No evidence of acute ischemia. No hydrocephalus. Vascular: No hyperdense vessel or unexpected calcification. Skull: No fracture or focal lesion. Sinuses/Orbits: No acute finding.  Bilateral cataract resection. Other: None. IMPRESSION: 1. Chronic isodense subdural collections. On the right, this measures up to 4 mm and is unchanged from May exam. On the left this has slightly increased in size measuring 5 mm, previously 3 mm. No hyperdense or acute hemorrhagic component. No associated mass effect.  2. Stable atrophy and chronic small vessel ischemia. Electronically Signed   By: Narda Rutherford M.D.   On: 11/05/2022 19:46    ED Course / MDM   Clinical Course as of 11/06/22 1057  Thu Nov 05, 2022  1953 Creatinine(!): 1.25 At baseline [RP]  1956 CT Head Wo Contrast Chronic isodense subdural collections. On the right, this measures up to 4 mm and is unchanged from May exam. On the left this has slightly increased in size measuring 5 mm, previously 3 mm. [RP]    Clinical Course User Index [RP] Rondel Baton, MD    Patient initially seen for failure to thrive, nausea yesterday by Dr. Jarold Motto.  Patient was boarding for physical therapy and possible placement.  Overnight she has had persistent nausea, retching.  Was seen by Dr. Oletta Cohn who had placed Zofran orders.  I was called by nursing to reevaluate patient.  Patient actively dry heaving, nursing states patient had a syncopal episode.  They apparently went to sit her up when she said she felt lightheaded and passed out according nursing staff.  When I saw patient at bedside she was awake, alert however stated she did not feel good.  Dry heaving.  Discussed with nursing staff to bring patient back to main ED for further workup and treatment.  Repeat EKG and troponin within normal limits.  This states she had some abdominal pain, subsequently will get CT scan will give small fluid bolus, EF 35% on last echo  Patient reassessed.  CT scan does not show any acute process.  I went to reassess patient.  States she felt "slightly" better.  She attempted to eat breakfast however states she could not due to the way she felt.  She denies any nausea however then started dry heaving.  Seems to have some mild confusion which per nursing that had her yesterday stated this is unchanged.  Will touch base with hospitalist for failure to thrive, syncope, persistent nausea.  Discussed with teaching service who is agreeable to evaluate patient for admission  The patient appears reasonably stabilized for admission considering the current resources, flow, and capabilities available in the ED at this time, and I doubt any other [facility] requiring further screening and/or treatment in the ED prior to admission.         Medical Decision Making Amount and/or Complexity of Data Reviewed External Data Reviewed: labs, radiology, ECG and notes. Labs: ordered. Decision-making details documented in ED Course. Radiology: ordered and independent interpretation performed.  Decision-making details documented in ED Course. ECG/medicine tests: ordered and independent interpretation performed. Decision-making details documented in ED Course.  Risk OTC drugs. Prescription drug management. Parenteral controlled substances. Decision regarding hospitalization. Diagnosis or treatment significantly limited by social determinants of health.          Jahkeem Kurka A, PA-C 11/06/22 1057    [place], [place] 11/06/22 1529

## 2022-11-06 NOTE — ED Provider Notes (Signed)
To see patient by nursing staff.  She is awaiting PT evaluation and TOC eval.  Patient reports that she feels "sick".  Patient complaining of nausea.  Abdominal exam is benign, nontender.  Will order Zofran, reevaluate.   Gilda Crease, MD 11/06/22 [phone]

## 2022-11-06 NOTE — ED Notes (Signed)
Dr Blinda Leatherwood in with patient

## 2022-11-06 NOTE — ED Notes (Signed)
In with patient, pt co nausea, as I was speaking to patient, pt had syncopal episode, made provider aware and provider in with patient, pt moved to room 34, EKG completed and given to Dr Theresia Lo, pt on monitor, report given to [facility]

## 2022-11-06 NOTE — H&P (Cosign Needed Addendum)
Date: 11/06/2022               Patient Name:  Amber Bolton MRN: [id number]  DOB: Dec 15, 1929 Age / Sex: 89 y.o., female   PCP: Judd Lien, PA-C         Medical Service: Internal Medicine Teaching Service         Attending Physician: Dr. Inez Catalina, MD      First Contact: Dr. Manuela Neptune, MD Pager [phone]    Second Contact: Dr. Marrianne Mood, MD Pager [phone]         After Hours (After 5p/  First Contact Pager: [phone]  weekends / holidays): Second Contact Pager: [phone]   SUBJECTIVE   Chief Complaint:   Chief Complaint  Patient presents with   Weakness    Failure to Thrive   History of Present Illness:   Amber Bolton is a 89 y.o. with a PMH significant for a bilateral subdural hematomas R>L, HFrEF, possible seizures vs TIA, T2DM, hypercalcemia, arthritis, HLD, and HTN, and OSA.  Presents with increasing weakness. Part of history was gathered from interviewing the patient as well as her daughter, and chart review. Per daughter, she was in bed and attempted to get up. She started feeling nauseous and lightheaded gave a few steps to get to her walker and sat on the floor (did not fall). Amber Bolton does not seem to remember getting out of bed, intermittently during the interview she does not remember if she stood up or not. Does state that she got nauseous and sick to her stomach upon trying to turn around. Cannot clearly state if she was dizzy or not, denies the room spinning.  Per daughter, her memory loss has been gradually worsening.   She has a history of subdural hematomas that were discovered 06/2022 after she developed transient left sided weakness and inability to speak. Her weakness at that time improved in the ambulance on the way to the ED and her speech returned as well. At the time a Code Stroke CT head showed Nonacute/isodense subdural hematoma along the bilateral cerebral convexity measuring up to 4 mm in thickness on the right. Chronic small  vessel ischemia and atrophy. No large vessel occlusion or significant stenosis on CTA of the head and neck. An MRI was negative for acute infarct and no other acute intracranial abnormality. Positive for small Bilateral Subdural Hematomas, right side (3-4 mm) more extensive than left (up to 3 mm). No significant intracranial mass effect. She was started on Keppra for seizure prophylaxis and was recommended to start OT/PT.   She followed up with Neurology on 07/02/2022 for possible TIA vs Seizures. At that time, she was AAOx3, with recent and remote memory being intact. Attention span and concentration and fund of knowledge was appropriate. She did have a diminished recall 1/3. Clock drawing was 3/4. Mental status exam was not done. CN were intact except for diminished hearing. Her gait was cautious but steady. And EEG was done and it was normal. Since then, has been to the ED for episodic confusion and dizziness on 07/09/2022. However, this was not associated with nausea.  This was intermittent and resolved during her ED admission. After a period of observation, joint decision making was done to follow up with Neuro outpatient. On 5/21 she got an EEG was normal.   Per pt, the feeling she has today is new. Per daughter, she has been persistently nauseous over the past month. Per pt, nausea is new.  No vomiting. Not associated with fevers, chills but recently had diarrhea and also the flu. Does now know the timing of her diarrhea and flu. She denies SOB, chest palpitations or chest pain, denies abdominal pain, difficulty urinating, starting a stream, or dysuria. States that her family told her to come in to the hospital even though she did not want to.   Per daughter, the confusion is not new but has worsened now. She is able to ambulate with a walker but needs assistance with all her ADLs and IDLs. Her son lives with her and assists with her medications. She has slowly been decreasing food intake. Will eat chicken  dumplings, pasta. Baked potatoes. Fruits.   ED Course:  VS: Hypertensive  Electrolytes unremarkable: Na/K WNL, Calcium 10.3, LFTs WNL, BUN 21, Cr 1.25 (baseline) , Glu 170, No AG  Cardiac: BNP 45.5 Tops 9/6   EKG showed sinus rhythm Atrial premature complex Probable left atrial enlargement Nonspecific IVCD with LAD Left ventricular hypertrophy Left bundle branch block not significantly changed.   No signs of infection: WBC, CXR no acute findings. U/A trace leukocytes but no signs of a UTI.   CT Abdomen and pelvis was remarkable for no acute findings. However, she does have a stable mild fusiform dilatation of the visualized distal thoracic aorta measuring up to 3.2 cm with associated slightly more prominent smoothly contoured anterior mural thrombus associated with atherosclerotic plaque. 3. Stable diverticulosis of the colon without evidence of acute diverticulitis. 4. Aortic atherosclerosis. Aortic Atherosclerosis   Hgb/HH WNL.   CT Head: IMPRESSION: 1. Chronic isodense subdural collections. On the right, this measures up to 4 mm and is unchanged from May exam. On the left this has slightly increased in size measuring 5 mm, previously 3 mm. No hyperdense or acute hemorrhagic component. No associated mass effect. 2. Stable atrophy and chronic small vessel ischemia.  Past Medical History:  Diagnosis Date   Arthritis    IN RIGHT HIP AND FINGERS AND BACK   Bilateral subdural hematomas (HCC)    Diabetes (HCC)    HFrEF (heart failure with reduced ejection fraction) (HCC)    Hypercalcemia    Hyperlipemia    Resistant hypertension 03/09/2022   Sleep apnea    STATES SHE HAS CPAP - BUT HAS NOT USED IN THE LAST 2 YRS   TIA (transient ischemic attack)    Past Surgical History:  Procedure Laterality Date   ABDOMINAL HYSTERECTOMY  1978   HEMORRHOID SURGERY  1960'S   TOTAL HIP ARTHROPLASTY Right 08/23/2012   Procedure: RIGHT TOTAL HIP ARTHROPLASTY ANTERIOR APPROACH;  Surgeon: [name], MD;  Location: [facility];  Service: Orthopedics;  Laterality: Right;   Meds:  Current Meds  Medication Sig   ALPRAZolam (XANAX) 0.5 MG tablet Take 0.25 mg by mouth at bedtime as needed for sleep.    amLODipine (NORVASC) 10 MG tablet Take 10 mg by mouth daily.   hydrochlorothiazide (HYDRODIURIL) 25 MG tablet Take 1 tablet (25 mg total) by mouth daily.   levETIRAcetam (KEPPRA) 250 MG tablet Take 1 tablet (250 mg total) by mouth 2 (two) times daily.   lisinopril (ZESTRIL) 40 MG tablet Take 1 tablet (40 mg total) by mouth daily.   ondansetron (ZOFRAN-ODT) 4 MG disintegrating tablet Take 1 tablet (4 mg total) by mouth every 8 (eight) hours as needed for nausea or vomiting.   traMADol (ULTRAM) 50 MG tablet Take 50 mg by mouth 2 (two) times daily as needed for moderate pain or severe  pain.   Allergies  Allergen Reactions   Penicillins Rash   Shellfish Allergy Itching    Mouth itches   Social:   Lives With: Son  Occupation: Retired Engineer, civil (consulting) Support: Daughter and son Level of Function: Dependent on all ADLs and IDLs PCP: Hyman Hopes, Meghan H, PA-C Substances: No hx of smoking, occasionally drinks alcohol.   Family History:   Mother Died Breast Cancer  Father unknown  Review of Systems: A complete ROS was negative except as per HPI.   OBJECTIVE:   Physical Exam: Blood pressure (!) 141/74, pulse 88, temperature 98 F (36.7 C), temperature source Oral, resp. rate 18, height 4\' 11"  (1.499 m), weight 56.6 kg, SpO2 (!) 85%.  Constitutional: Elderly, frail, sitting in bed, in no acute distress.  HENT: normocephalic atraumatic, mucous membranes moist Eyes: conjunctiva non-erythematous Neck: supple Cardiovascular: regular rate and rhythm, no m/r/g No JVD or BLE edema. Pulmonary/Chest: normal work of breathing on room air, lungs clear to auscultation except at the left lower lung base with mild crackles. Abdominal: soft, non-tender, non-distended MSK: normal bulk and tone Neurological:  Alert and oriented to self and place but not to time. Negative pronator drift, CN2-11 intact, except she does report hearing loss that is not acute.  Skin: warm and dry  Labs: CBC    Component Value Date/Time   WBC 7.3 11/05/2022 1753   RBC 4.98 11/05/2022 1753   HGB 14.9 11/05/2022 1753   HCT 45.6 11/05/2022 1753   PLT 182 11/05/2022 1753   MCV 91.6 11/05/2022 1753   MCH 29.9 11/05/2022 1753   MCHC 32.7 11/05/2022 1753   RDW 11.9 11/05/2022 1753   LYMPHSABS 1.5 07/09/2022 1530   MONOABS 0.6 07/09/2022 1530   EOSABS 0.1 07/09/2022 1530   BASOSABS 0.1 07/09/2022 1530     CMP     Component Value Date/Time   NA 139 11/05/2022 1753   K 3.8 11/05/2022 1753   CL 100 11/05/2022 1753   CO2 26 11/05/2022 1753   GLUCOSE 170 (H) 11/05/2022 1753   BUN 21 11/05/2022 1753   CREATININE 1.25 (H) 11/05/2022 1753   CALCIUM 10.3 11/05/2022 1753   PROT 6.1 (L) 11/05/2022 1753   ALBUMIN 3.1 (L) 11/05/2022 1753   AST 17 11/05/2022 1753   ALT 14 11/05/2022 1753   ALKPHOS 47 11/05/2022 1753   BILITOT 0.3 11/05/2022 1753   GFRNONAA 41 (L) 11/05/2022 1753   GFRAA 52 (L) 12/31/2015 1623    Imaging:  CT ABDOMEN PELVIS W CONTRAST  Result Date: 11/06/2022 CLINICAL DATA:  Abdominal pain, vomiting and weakness. EXAM: CT ABDOMEN AND PELVIS WITH CONTRAST TECHNIQUE: Multidetector CT imaging of the abdomen and pelvis was performed using the standard protocol following bolus administration of intravenous contrast. RADIATION DOSE REDUCTION: This exam was performed according to the departmental dose-optimization program which includes automated exposure control, adjustment of the mA and/or kV according to patient size and/or use of iterative reconstruction technique. CONTRAST:  75mL OMNIPAQUE IOHEXOL 350 MG/ML SOLN COMPARISON:  08/24/2021 FINDINGS: Lower chest: No acute abnormality. Hepatobiliary: No focal liver abnormality is seen. No gallstones, gallbladder wall thickening, or biliary dilatation. Pancreas:  Unremarkable. No pancreatic ductal dilatation or surrounding inflammatory changes. Spleen: Normal in size without focal abnormality. Adrenals/Urinary Tract: Adrenal glands are unremarkable. Kidneys are normal, without renal calculi, focal lesion, or hydronephrosis. Bladder is unremarkable. Stomach/Bowel: Bowel shows no evidence of obstruction, ileus, inflammation or lesion. The appendix is not discretely visualized. No free intraperitoneal air. Stable diverticulosis of the colon without  evidence of acute diverticulitis. Vascular/Lymphatic: Atherosclerosis of the aorta noted with stable mild fusiform dilatation of the visualized distal thoracic aorta measuring up to 3.2 cm with associated slightly more prominent smoothly contoured anterior mural thrombus associated with atherosclerotic plaque. No evidence of abdominal aortic aneurysm. No lymphadenopathy identified. Reproductive: Status post hysterectomy. No adnexal masses. Other: No abdominal wall hernia or abnormality. No abdominopelvic ascites. Musculoskeletal: Stable diffuse degenerative disc disease throughout the spine with associated mild scoliosis. Stable appearance right hip arthroplasty IMPRESSION: 1. No acute findings in the abdomen or pelvis. 2. Stable mild fusiform dilatation of the visualized distal thoracic aorta measuring up to 3.2 cm with associated slightly more prominent smoothly contoured anterior mural thrombus associated with atherosclerotic plaque. 3. Stable diverticulosis of the colon without evidence of acute diverticulitis. 4. Aortic atherosclerosis. Aortic Atherosclerosis (ICD10-I70.0). Electronically Signed   By: Irish Lack M.D.   On: 11/06/2022 08:43   DG Chest 2 View  Result Date: 11/05/2022 CLINICAL DATA:  Weakness.  Fatigue. EXAM: CHEST - 2 VIEW COMPARISON:  07/09/2022 FINDINGS: Chronic cardiomegaly. Unchanged mediastinal contours. Normal pulmonary vasculature. No pleural effusion, focal airspace disease, or pneumothorax.  Chronic bilateral shoulder arthropathy. IMPRESSION: Chronic cardiomegaly. No acute chest findings. Electronically Signed   By: Narda Rutherford M.D.   On: 11/05/2022 19:47   CT Head Wo Contrast  Result Date: 11/05/2022 CLINICAL DATA:  history of sdh, n/v, weak EXAM: CT HEAD WITHOUT CONTRAST TECHNIQUE: Contiguous axial images were obtained from the base of the skull through the vertex without intravenous contrast. RADIATION DOSE REDUCTION: This exam was performed according to the departmental dose-optimization program which includes automated exposure control, adjustment of the mA and/or kV according to patient size and/or use of iterative reconstruction technique. COMPARISON:  Head CT 07/09/2022 FINDINGS: Brain: Chronic isodense subdural collections. On the right, this measures up to 4 mm and is unchanged, series 5, image 26. On the left this has slightly increased in size measuring 5 mm, series 5, image 19, previously 3 mm. No hyperdense or acute hemorrhagic component. No associated mass effect. Stable degree of atrophy and chronic small vessel ischemia. No evidence of acute ischemia. No hydrocephalus. Vascular: No hyperdense vessel or unexpected calcification. Skull: No fracture or focal lesion. Sinuses/Orbits: No acute finding.  Bilateral cataract resection. Other: None. IMPRESSION: 1. Chronic isodense subdural collections. On the right, this measures up to 4 mm and is unchanged from May exam. On the left this has slightly increased in size measuring 5 mm, previously 3 mm. No hyperdense or acute hemorrhagic component. No associated mass effect. 2. Stable atrophy and chronic small vessel ischemia. Electronically Signed   By: Narda Rutherford M.D.   On: 11/05/2022 19:46     ASSESSMENT & PLAN:   Assessment & Plan by Problem: Principal Problem:   Syncope Active Problems:   Subdural hematoma (HCC)   Nausea with vomiting   Amber Bolton is a 89 y.o. significant for a bilateral subdural hematomas R>L,  HFrEF, possible seizures vs TIA on 07/2022, T2DM, hypercalcemia, arthritis, HLD, and HTN, and OSA  #Syncope  #Weakness Positional nature likely orthostatic. Per daughter she was in her bed stood up and felt nauseous and lightheaded. She is thirsty. She also had near syncopal event in the ED related to postural changes. Hct slightly elevated at 45.5 Less likely cardiac or neurological.  EKG has LBB but not changed as this was present in April. Troponins flat. CT Head shows no acute intracranial abnormalities. Has bilateral subdural hematomas which have increased  in size since May. However, there are no neurological findings on exam. She is confused and it can also happen in the setting of dehydration. She did not look post-ictal. EEG was previously normal, she denies having BM or tongue biting associated with the events. She felt lightheaded and sat on the floor, did not fall or had LOC.  - S/p small LR bolus  - Encourage PO intake WITH CAUTION due to HF.  - Pt on telemetry  -PT/OT as tolerated  #Nausea Qtc . Would be against using antiemetics with pyramidal or antimuscarinic side effects.  - Zofran 4mg  q8hrs PRN -Pt on telemetry   #History of TIA possible seizures  -Continue home Keppra 250mg  BID -Cw rosuvastatin 10mg  daily   #HFrEF  Last echo (06/18/2022) 35-40% with global hypokinesis and severe concentric hypokinesis. And Grade I diastolic dysfunction.  Not on GDMT. BP control as below. Not on exacerbation. Mild crackles on LLL. Continue to monitor.  #HTN Amlodipine 10mg  daily, hydrochlorothiazide  25mg  daily, and Lisinopril 40mg  daily.  Holding due to orthostatics.   #T2DM  Hgb A1C 7.3 does not use any medications at home.  -Continue to monitor glucose.   #Hx of Insomnia On Alprazolam at home at bedtime PRN. Will not start this while inpatient.  -Delirium precautions.    Diet: Regular VTE: SCDs IVF: LR,Bolus Code: DNR/DNI  Prior to Admission Living Arrangement: Home  with son. Anticipated Discharge Location: Home Barriers to Discharge: medical workup  Dispo: Admit patient to Observation with expected length of stay less than 2 midnights.  Signed: [facility]  Internal Medicine Resident, PGY-1 [facility]  Pager: [phone]  11/06/2022, 2:20 PM

## 2022-11-06 NOTE — Evaluation (Signed)
Physical Therapy Evaluation Patient Details Name: Amber Bolton MRN: [id number] DOB: 03-09-1929 Today's Date: 11/06/2022  History of Present Illness  Pt is 89 year old presented [facility] on  [facility]/22 for weakness and FTT. Pt with syncopal episode with nursing on AM of 11/06/22. Pt in supine at the time.  PMH - TIA, HTN, arthritis, sleep apnea, rt hip fx  Clinical Impression  Pt presents to PT with weakness and decr mobility. Eval limited to pt sitting on edge of stretcher. Pt seemed very fatigued at that point with some sighing breaths. In light of syncopal episode earlier this AM didn't attempt further mobility. Pt typically is ambulatory on her own at home (per pt) and is not at that level currently. Patient will benefit from continued inpatient follow up therapy, <3 hours/day         If plan is discharge home, recommend the following: A lot of help with bathing/dressing/bathroom;A lot of help with walking and/or transfers;Assist for transportation   Can travel by private vehicle   No    Equipment Recommendations None recommended by PT  Recommendations for Other Services       Functional Status Assessment Patient has had a recent decline in their functional status and demonstrates the ability to make significant improvements in function in a reasonable and predictable amount of time.     Precautions / Restrictions Precautions Precautions: Fall;Other (comment) Precaution Comments: syncope      Mobility  Bed Mobility Overal bed mobility: Needs Assistance Bed Mobility: Supine to Sit, Sit to Supine     Supine to sit: Min assist Sit to supine: Min assist        Transfers                   General transfer comment: Did not attempt    Ambulation/Gait                  Stairs            Wheelchair Mobility     Tilt Bed    Modified Rankin (Stroke Patients Only)       Balance Overall balance assessment: Needs assistance Sitting-balance support:  No upper extremity supported, Feet unsupported Sitting balance-Leahy Scale: Fair                                       Pertinent Vitals/Pain Pain Assessment Pain Assessment: No/denies pain    Home Living Family/patient expects to be discharged to:: Private residence Living Arrangements: Children Available Help at Discharge: Family;Available PRN/intermittently Type of Home: House Home Access: Level entry       Home Layout: One level Home Equipment: Agricultural consultant (2 wheels);BSC/3in1;Shower seat      Prior Function Prior Level of Function : Needs assist             Mobility Comments: Modified independent with RW       Extremity/Trunk Assessment   Upper Extremity Assessment Upper Extremity Assessment: Generalized weakness    Lower Extremity Assessment Lower Extremity Assessment: Generalized weakness       Communication   Communication Communication: Hearing impairment  Cognition Arousal: Alert Behavior During Therapy: WFL for tasks assessed/performed Overall Cognitive Status: No family/caregiver present to determine baseline cognitive functioning  General Comments General comments (skin integrity, edema, etc.): VSS on RA.    Exercises     Assessment/Plan    PT Assessment Patient needs continued PT services  PT Problem List Decreased strength;Decreased activity tolerance;Decreased balance;Decreased mobility       PT Treatment Interventions DME instruction;Gait training;Functional mobility training;Therapeutic activities;Therapeutic exercise;Balance training;Patient/family education    PT Goals (Current goals can be found in the Care Plan section)  Acute Rehab PT Goals PT Goal Formulation: With patient Time For Goal Achievement: 11/20/22 Potential to Achieve Goals: Good    Frequency Min 1X/week     Co-evaluation               AM-PAC PT "6 Clicks" Mobility   Outcome Measure Help needed turning from your back to your side while in a flat bed without using bedrails?: A Little Help needed moving from lying on your back to sitting on the side of a flat bed without using bedrails?: A Little Help needed moving to and from a bed to a chair (including a wheelchair)?: A Lot Help needed standing up from a chair using your arms (e.g., wheelchair or bedside chair)?: A Lot Help needed to walk in hospital room?: Total Help needed climbing 3-5 steps with a railing? : Total 6 Click Score: 12    End of Session   Activity Tolerance: Patient limited by fatigue Patient left: in bed;with call bell/phone within reach;Other (comment) (MD present) Nurse Communication: Mobility status PT Visit Diagnosis: Other abnormalities of gait and mobility (R26.89);Muscle weakness (generalized) (M62.81);History of falling (Z91.81)    Time: 2440-1027 PT Time Calculation (min) (ACUTE ONLY): 14 min   Charges:   PT Evaluation $PT Eval Moderate Complexity: 1 Mod   PT General Charges $$ ACUTE PT VISIT: 1 Visit         [facility] PT Acute Rehabilitation Services Office [phone]   Angelina Ok [facility] 11/06/2022, 9:57 AM

## 2022-11-07 ENCOUNTER — Observation Stay (HOSPITAL_BASED_OUTPATIENT_CLINIC_OR_DEPARTMENT_OTHER): Payer: Medicare Other

## 2022-11-07 DIAGNOSIS — R112 Nausea with vomiting, unspecified: Secondary | ICD-10-CM

## 2022-11-07 DIAGNOSIS — R55 Syncope and collapse: Principal | ICD-10-CM

## 2022-11-07 DIAGNOSIS — R531 Weakness: Principal | ICD-10-CM

## 2022-11-07 LAB — ECHOCARDIOGRAM COMPLETE
Height: 59 in
S' Lateral: 2.8 cm
Weight: 1957.68 [oz_av]

## 2022-11-07 LAB — BASIC METABOLIC PANEL
Anion gap: 7 (ref 5–15)
BUN: 16 mg/dL (ref 8–23)
CO2: 26 mmol/L (ref 22–32)
Calcium: 9.5 mg/dL (ref 8.9–10.3)
Chloride: 101 mmol/L (ref 98–111)
Creatinine, Ser: 1.43 mg/dL — ABNORMAL HIGH (ref 0.44–1.00)
GFR, Estimated: 35 mL/min — ABNORMAL LOW (ref >60)
Glucose, Bld: 126 mg/dL — ABNORMAL HIGH (ref 70–99)
Potassium: 3.7 mmol/L (ref 3.5–5.1)
Sodium: 134 mmol/L — ABNORMAL LOW (ref 135–145)

## 2022-11-07 LAB — MAGNESIUM: Magnesium: 1.8 mg/dL (ref 1.7–2.4)

## 2022-11-07 MED ORDER — ONDANSETRON 4 MG PO TBDP: 4.0000 mg | ORAL_TABLET | Freq: Three times a day (TID) | ORAL | Status: DC | PRN

## 2022-11-07 MED ORDER — ALPRAZOLAM 0.25 MG PO TABS
0.2500 mg | ORAL_TABLET | Freq: Once | ORAL | Status: AC
  Administered 2022-11-07: 0.25 mg via ORAL
  Filled 2022-11-07: qty 1

## 2022-11-07 MED ORDER — ENSURE ENLIVE PO LIQD
237.0000 mL | Freq: Two times a day (BID) | ORAL | Status: DC
  Administered 2022-11-07 – 2022-11-09 (×3): 237 mL via ORAL

## 2022-11-07 MED ORDER — TRIMETHOBENZAMIDE HCL 100 MG/ML IM SOLN
200.0000 mg | Freq: Once | INTRAMUSCULAR | Status: AC
  Administered 2022-11-08: 200 mg via INTRAMUSCULAR
  Filled 2022-11-07: qty 2

## 2022-11-07 NOTE — Progress Notes (Signed)
Physical Therapy Treatment Patient Details Name: Amber Bolton MRN: [id number] DOB: May 15, 1929 Today's Date: 11/07/2022   History of Present Illness Pt is 89 year old presented to [facility] on  11/05/22 for weakness and FTT. Pt with syncopal episode with nursing on AM of 11/06/22. Pt in supine at the time.  PMH - TIA, HTN, arthritis, sleep apnea, rt hip fx    PT Comments  Pt greeted resting in bed and agreeable to session with good progress towards acute goals. Pt pleasant and participatory throughout session and requiring grossly CGA down to supervision for bed mobility, transfers and gait with RW support, with light cues upright posture and forward gaze. Pt was educated on continued walker use to maximize functional independence, safety, and decrease risk for falls. PT without c/o dizziness/lightheadedness throughout session and BP stable throughout. Updated recommendations as pt appearing close to baseline mobility and endorsing appropriate assist level available post acutely. Pt continues to benefit from skilled PT services to progress toward functional mobility goals.      If plan is discharge home, recommend the following: Assist for transportation;A little help with walking and/or transfers;A little help with bathing/dressing/bathroom   Can travel by private vehicle        Equipment Recommendations  None recommended by PT    Recommendations for Other Services       Precautions / Restrictions Precautions Precautions: Fall;Other (comment) Precaution Comments: syncope     Mobility  Bed Mobility Overal bed mobility: Needs Assistance Bed Mobility: Supine to Sit, Sit to Supine     Supine to sit: Supervision     General bed mobility comments: supervision for safety    Transfers Overall transfer level: Needs assistance Equipment used: Rolling walker (2 wheels) Transfers: Sit to/from Stand Sit to Stand: Contact guard assist           General transfer comment: CGA for safety     Ambulation/Gait Ambulation/Gait assistance: Supervision, Contact guard assist Gait Distance (Feet): 180 Feet Assistive device: Rolling walker (2 wheels) Gait Pattern/deviations: Step-through pattern Gait velocity: slightly decr     General Gait Details: steady gait with RW support, CGA fading to supervision for safety, x1 standing rest break at halway point, pt conversational throughout without DOE   Stairs             Wheelchair Mobility     Tilt Bed    Modified Rankin (Stroke Patients Only)       Balance Overall balance assessment: Needs assistance Sitting-balance support: No upper extremity supported, Feet unsupported Sitting balance-Leahy Scale: Good     Standing balance support: No upper extremity supported Standing balance-Leahy Scale: Fair Standing balance comment: able to static stand at EOB witout UE support, reliant on at least single  UE support during gait                            Cognition Arousal: Alert Behavior During Therapy: [facility] for tasks assessed/performed Overall Cognitive Status: Within Functional Limits for tasks assessed                                          Exercises      General Comments General comments (skin integrity, edema, etc.): BP sitting up EOB 111/98, standing 126/71      Pertinent Vitals/Pain Pain Assessment Pain Assessment: Faces Faces Pain Scale: Hurts  a little bit Pain Location: hip Pain Descriptors / Indicators: Sore Pain Intervention(s): Monitored during session, Limited activity within patient's tolerance    Home Living                          Prior Function            PT Goals (current goals can now be found in the care plan section) Acute Rehab PT Goals PT Goal Formulation: With patient Time For Goal Achievement: 11/20/22 Progress towards PT goals: Progressing toward goals    Frequency    Min 1X/week      PT Plan      Co-evaluation               AM-PAC PT "6 Clicks" Mobility   Outcome Measure  Help needed turning from your back to your side while in a flat bed without using bedrails?: A Little Help needed moving from lying on your back to sitting on the side of a flat bed without using bedrails?: A Little Help needed moving to and from a bed to a chair (including a wheelchair)?: A Little Help needed standing up from a chair using your arms (e.g., wheelchair or bedside chair)?: A Little Help needed to walk in hospital room?: A Little Help needed climbing 3-5 steps with a railing? : Total 6 Click Score: 16    End of Session   Activity Tolerance: Patient tolerated treatment well Patient left: with call bell/phone within reach;in chair Nurse Communication: Mobility status PT Visit Diagnosis: Other abnormalities of gait and mobility (R26.89);Muscle weakness (generalized) (M62.81);History of falling (Z91.81)     Time: 1051-1110 PT Time Calculation (min) (ACUTE ONLY): 19 min  Charges:    $Gait Training: 8-22 mins PT General Charges $$ ACUTE PT VISIT: 1 Visit                     Amber Bolton R. PTA Acute Rehabilitation Services Office: [phone]   Catalina Antigua 11/07/2022, 11:17 AM

## 2022-11-07 NOTE — Care Management Obs Status (Signed)
MEDICARE OBSERVATION STATUS NOTIFICATION   Patient Details  Name: Amber Bolton MRN: [id number] Date of Birth: 1929-08-13   Medicare Observation Status Notification Given:  Yes    Ronny Bacon, RN 11/07/2022, 4:33 PM

## 2022-11-07 NOTE — Progress Notes (Addendum)
HD#0 SUBJECTIVE:  Patient Summary: Amber Bolton is a 89 y.o.  with a PMH significant for a bilateral subdural hematomas, HFrEF, possible seizures vs TIA, T2DM, hypercalcemia, arthritis, HLD, and HTN, and OSA.   Overnight Events: Had some episodes of bradycardia.   Interim History: Patient had a great night sleep, feels stronger, is less confused. No nausea. Was able to eat food.   OBJECTIVE:  Vital Signs: Vitals:   11/07/22 0009 11/07/22 0500 11/07/22 0503 11/07/22 0826  BP: 110/76  115/80 123/76  Pulse: 77  65 68  Resp:    17  Temp: 98 F (36.7 C)  98 F (36.7 C)   TempSrc: Oral  Oral   SpO2: 99%  97% 98%  Weight:  55.5 kg    Height:       Supplemental O2: Room Air SpO2: 98 %  Filed Weights   11/05/22 1628 11/07/22 0500  Weight: 56.6 kg 55.5 kg    No intake or output data in the 24 hours ending 11/07/22 1221 Net IO Since Admission: -200 mL [11/07/22 1221]  Physical Exam: Physical Exam Constitutional:      General: She is not in acute distress.    Appearance: She is not toxic-appearing.  Cardiovascular:     Rate and Rhythm: Normal rate and regular rhythm.     Heart sounds: No murmur heard.    No friction rub. No gallop.     Comments: No JVD Pulmonary:     Effort: No respiratory distress.     Breath sounds: Rales (On the left upper and lower lobes) present. No wheezing.  Abdominal:     General: There is no distension.     Palpations: Abdomen is soft.     Tenderness: There is no abdominal tenderness. There is no guarding.  Musculoskeletal:     Right lower leg: No edema.     Left lower leg: No edema.  Neurological:     Mental Status: She is alert.    Patient Lines/Drains/Airways Status     Active Line/Drains/Airways     Name Placement date Placement time Site Days   Peripheral IV 11/05/22 20 G Anterior;Right Forearm 11/05/22  1800  Forearm  2             ASSESSMENT/PLAN:  Assessment: Principal Problem:   Syncope Active Problems:    Subdural hematoma (HCC)   Nausea with vomiting   Plan:  Amber Bolton is a 89 y.o. significant for a bilateral subdural hematomas R>L, HFrEF, possible seizures vs TIA on 07/2022, T2DM, hypercalcemia, arthritis, HLD, and HTN, and OSA   #Syncope  #Weakness Improvement after small LR bolus yesterday. Encouraging PO intake. Nausea and confusion resolved ON, and was able to tolerate her food. Nausea may be related to dehydration given improvement. Could be possible that bilateral subdural hematomas are causing it but unlikely since not causing much mass effect. Patient does not currently have a HA.  - Encourage PO intake WITH CAUTION due to HF.  -Regular diet  -PT/OT as tolerated - Home health PT upon discharge   #Nausea Qtc . Would be against using antiemetics with pyramidal or antimuscarinic side effects.  -Recheck EKG prior to additional Zofran if needed -Pt on telemetry  -Echo    #History of TIA possible seizures  #Subdural Hematomas -Continue home Keppra 250mg  BID -Cw rosuvastatin 10mg  daily  -Consulted neurosurgery, appreciate recommendations. Given age, high risk to operate.   #HFrEF  Last echo (06/18/2022) 35-40% with global hypokinesis and  severe concentric hypokinesis. And Grade I diastolic dysfunction.  Not on GDMT. BP control as below. Not on exacerbation. Mild crackles on LLL. Continue to monitor.  #Bradycardia  Was bradycardic in the mid 50s early this morning. Patient was sleeping well overnight. Her heart rate went back up to the low 70s when she got woken up by the team. EKG does not show an AV block. Pt on telemetry and will continue to monitor.    #HTN Amlodipine 10mg  daily, hydrochlorothiazide  25mg  daily, and Lisinopril 40mg  daily.  Holding due to orthostatics and normotensive currently.    #T2DM  Hgb A1C 7.3 does not use any medications at home.  -Continue to monitor glucose with morning BMPs.   #Hx of Insomnia On Alprazolam at home at bedtime PRN.  Will not start this while inpatient.  -Delirium precautions.  -Zyprexa under the tongue PRN    Diet: Regular VTE: SCDs IVF: LR,Bolus Code: DNR/DNI   Prior to Admission Living Arrangement: Home with son. Anticipated Discharge Location: Home Barriers to Discharge: medical workup   Signature: [facility]  Internal Medicine Resident, PGY-1 [facility]  Pager: [phone] 12:21 PM, 11/07/2022   Please contact the on call pager after 5 pm and on weekends at [phone].

## 2022-11-07 NOTE — Hospital Course (Signed)
Amber Bolton is a 89 y.o. significant for a bilateral subdural hematomas, HFrEF, possible seizures vs TIA on 07/2022, T2DM, hypercalcemia, arthritis, HLD, and HTN, and OSA who presented with intermittent symptoms of dizziness and nausea, as well as generalized weakness, reduced appetite and increased assistance with ADLs. In the ED, she had a syncopal episode after feeling lightheaded when attempting to sit/stand up. She was admitted for Syncope.   #Syncope  #Weakness Showed Improvement after small LR bolus. Nausea and confusion resolved ON, and was able to tolerate her food. Nausea may be related to dehydration given improvement and positional nature of symptoms. Could be possible that bilateral subdural hematomas are causing it but unlikely since not causing much mass effect on CT or neurological deficits on exam. NO acute findings on head CT.  -Encourage PO intake WITH CAUTION due to HF.  -Regular diet  -Home Health PT 3x weekly -Ambulate every 2 hours at home  -Use a rolling walker and shower chair.   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 Cannot eat as she gets nauseous when she sees food. Was given ondansetron PRN but has a prolonged QT. She was found to have esophageal dysmotility, a diverticulum in the distal esophagus, and concern for distal stricture. Will follow up with [facility] outpatient. CT Ab pelv with no acute findings. -Start Famotidine 40mg  Daily  -Please make sure that patient has followed up with GI -Please make sure patient has stopped ondansetron.   #History of TIA possible seizures  #Subdural Hematomas -Continue home Keppra 250mg  BID -Cw rosuvastatin 10mg  daily  -Spoke with neurosurgery, given age, high risk to operate.   #HFrEF  Last echo (06/18/2022) 35-40% with global hypokinesis and severe concentric hypokinesis. And Grade I diastolic dysfunction. EF improving in most  recent echo done 9/7 with EF 45-50% and mildly decreased ventricular dysmotility. LVH. Not on GDMT. BP control as below. Not on exacerbation. Mild crackles on LLL at admission resolved throughout her hospitalization.   #HTN Amlodipine 10mg  daily, hydrochlorothiazide  25mg  daily, and Lisinopril 40mg  daily.  Holding due to orthostatics and normotensive currently.  Please consider restarting if indicated at follow up.    #T2DM  Hgb A1C 7.3 does not use any medications at home.  -Given lack of appetite and weakness she followed normal diet. She is 89. Tight control not necessary. Continue to monitor outpatient PRN.

## 2022-11-07 NOTE — Plan of Care (Signed)

## 2022-11-07 NOTE — Progress Notes (Signed)
  Echocardiogram 2D Echocardiogram has been performed.  Delcie Roch 11/07/2022, 6:06 PM

## 2022-11-07 NOTE — Plan of Care (Signed)

## 2022-11-08 MED ORDER — ALPRAZOLAM 0.25 MG PO TABS
0.2500 mg | ORAL_TABLET | Freq: Every evening | ORAL | Status: DC | PRN
  Administered 2022-11-08: 0.25 mg via ORAL
  Filled 2022-11-08: qty 1

## 2022-11-08 NOTE — Plan of Care (Signed)

## 2022-11-08 NOTE — Progress Notes (Signed)
                 Interval history Feeling well, enjoying lunch today, but has some early satiety.  Eager to leave the hospital and return home.  Physical exam Blood pressure 132/89, pulse 79, temperature 98.1 F (36.7 C), temperature source Oral, resp. rate 17, height 4\' 11"  (1.499 m), weight 58.4 kg, SpO2 96%.  No distress Heart rate is normal, rhythm is regular, radial pulses strong Breathing is regular and unlabored on room air Skin is warm and dry Alert and oriented Pleasant congruent affect  Assessment and plan Hospital day 0  Amber Bolton is a 89 y.o. with a history of bilateral subdural hematomas admitted for syncope.  Principal Problem:   Syncope Active Problems:   Subdural hematoma (HCC)   Nausea with vomiting   Weakness  Syncope Suspect postural hypotension in setting of decreased solute intake.  Status post IV fluid hydration, feeling somewhat better now.  Encouraging good oral fluid intake.  Nausea Early satiety Globus sensation Uncertain what is causing all of this.  Does not seem to be having food sticking when she swallows but discomfort seems to come from her throat.  Doubt EGD would benefit this 89 year old, wonder if SLP have any less invasive tests like barium swallow that might be illuminating. - SLP consult ordered - Can try Tigan or Ativan for nausea in setting of prolonged QT  Chronic subdural hematomas Stable.  Nothing to do per neurosurgery. - Home Keppra 250 mg twice daily  Disposition update Daughter is having a lot of trouble taking care of this person at home.  She was hopeful for SNF placement on discharge, and is somewhat disappointed to find out that she may not qualify based on PT/OT recommendations.  Chronic stable problems: Hyperlipidemia-rosuvastatin Hypertension-probably stop BP meds on discharge Insomnia-we will restart home alprazolam for bedtime as needed  Diet: Regular IVF: N/A VTE: SCDs Start: 11/06/22 1000  Code:  DNR PT/OT recommendations: Home health Family Update: At bedside  Discharge plan: Pending further medical workup  Amber Mood MD 11/08/2022, 7:26 PM  Pager: [phone] After 5pm or weekend: [phone]

## 2022-11-09 ENCOUNTER — Other Ambulatory Visit (HOSPITAL_COMMUNITY): Payer: Self-pay

## 2022-11-09 ENCOUNTER — Observation Stay (HOSPITAL_COMMUNITY): Payer: Medicare Other

## 2022-11-09 DIAGNOSIS — K224 Dyskinesia of esophagus: Secondary | ICD-10-CM | POA: Diagnosis not present

## 2022-11-09 DIAGNOSIS — K225 Diverticulum of esophagus, acquired: Secondary | ICD-10-CM

## 2022-11-09 DIAGNOSIS — R55 Syncope and collapse: Secondary | ICD-10-CM | POA: Diagnosis not present

## 2022-11-09 LAB — RENAL FUNCTION PANEL
Albumin: 3 g/dL — ABNORMAL LOW (ref 3.5–5.0)
Anion gap: 7 (ref 5–15)
BUN: 17 mg/dL (ref 8–23)
CO2: 23 mmol/L (ref 22–32)
Calcium: 9.5 mg/dL (ref 8.9–10.3)
Chloride: 104 mmol/L (ref 98–111)
Creatinine, Ser: 1.3 mg/dL — ABNORMAL HIGH (ref 0.44–1.00)
GFR, Estimated: 39 mL/min — ABNORMAL LOW (ref >60)
Glucose, Bld: 192 mg/dL — ABNORMAL HIGH (ref 70–99)
Phosphorus: 2.4 mg/dL — ABNORMAL LOW (ref 2.5–4.6)
Potassium: 3.6 mmol/L (ref 3.5–5.1)
Sodium: 134 mmol/L — ABNORMAL LOW (ref 135–145)

## 2022-11-09 MED ORDER — ONDANSETRON 4 MG PO TBDP
4.0000 mg | ORAL_TABLET | Freq: Three times a day (TID) | ORAL | 0 refills | Status: DC | PRN
Start: 2022-11-09 — End: 2022-11-09
  Filled 2022-11-09: qty 45, 15d supply, fill #0

## 2022-11-09 MED ORDER — ENSURE ENLIVE PO LIQD: 237.0000 mL | Freq: Three times a day (TID) | ORAL | Status: DC

## 2022-11-09 MED ORDER — PANTOPRAZOLE SODIUM 40 MG PO TBEC
40.0000 mg | DELAYED_RELEASE_TABLET | Freq: Every day | ORAL | 1 refills | Status: AC
Start: 2022-11-09 — End: 2023-11-09

## 2022-11-09 MED ORDER — ENSURE ENLIVE PO LIQD
237.0000 mL | Freq: Three times a day (TID) | ORAL | 12 refills | Status: DC
  Filled 2022-11-09: qty 237, 1d supply, fill #0

## 2022-11-09 NOTE — Progress Notes (Signed)
   11/09/22 1053  Mobility  Activity Transferred to/from [facility]  Level of Assistance Standby assist, set-up cues, supervision of patient - no hands on  Assistive Device Front wheel walker  Distance Ambulated (ft) 2 ft  Activity Response Tolerated well  Mobility Referral Yes  $Mobility charge 1 Mobility  Mobility Specialist Start Time (ACUTE ONLY) 0950  Mobility Specialist Stop Time (ACUTE ONLY) 1005  Mobility Specialist Time Calculation (min) (ACUTE ONLY) 15 min   Mobility Specialist: Progress Note  Pt agreeable to mobility session - received in bed. Required SV using RW with c/o poor appetite. Pt returned to chair with all needs met - call bell within reach.   Barnie Mort, BS Mobility Specialist Please contact via SecureChat or Rehab office at [phone].

## 2022-11-09 NOTE — Progress Notes (Signed)
Physical Therapy Treatment Patient Details Name: Amber Bolton MRN: [id number] DOB: 1929/12/14 Today's Date: 11/09/2022   History of Present Illness Pt is 89 year old presented to [facility] on  11/05/22 for weakness and FTT. Pt with syncopal episode with nursing on AM of 11/06/22. Pt in supine at the time.  PMH - TIA, HTN, arthritis, sleep apnea, rt hip fx    PT Comments  Pt is progressing towards goals. Pt is currently a Mod I for bed mobility and sit to stand. Pt is supervision for gait due to weakness and fatigue. Recommend RW for something more stable than rollator at home for LE weakness and shower chair due to fatigue and risk for falls in the shower. Due to pt current functional status, home set up and available assistance at home recommending skilled physical therapy services 3x/weekly on discharge from acute care hospital setting in order to improve balance, strength and activity tolerance.  Pt will also benefit from home with family mobilizing every 2 hours while awake to improve strength, balance and endurance.    If plan is discharge home, recommend the following: Assist for transportation;A little help with walking and/or transfers   Can travel by private vehicle     No  Equipment Recommendations  Rolling walker (2 wheels);Other (comment) (shower chair)       Precautions / Restrictions Precautions Precautions: Fall;Other (comment) Precaution Comments: syncope Restrictions Weight Bearing Restrictions: No     Mobility  Bed Mobility     General bed mobility comments: pt in recliner on arrival and left in recliner at end of session.    Transfers Overall transfer level: Needs assistance Equipment used: Rolling walker (2 wheels) Transfers: Sit to/from Stand Sit to Stand: Modified independent (Device/Increase time)           General transfer comment: Mod I with RW from recliner and toilet.    Ambulation/Gait Ambulation/Gait assistance: Supervision Gait Distance (Feet):  120 Feet Assistive device: Rolling walker (2 wheels) Gait Pattern/deviations: Step-through pattern, Decreased stride length Gait velocity: sligthly decreased cadence. Gait velocity interpretation: 1.31 - 2.62 ft/sec, indicative of limited community ambulator   General Gait Details: steady gait with RW support, supervision with gait due to fatigue.        Balance Overall balance assessment: Mild deficits observed, not formally tested Sitting-balance support: No upper extremity supported, Feet unsupported Sitting balance-Leahy Scale: Good     Standing balance support: No upper extremity supported, Bilateral upper extremity supported, Single extremity supported Standing balance-Leahy Scale: Fair Standing balance comment: no LOB        Cognition Arousal: Alert Behavior During Therapy: WFL for tasks assessed/performed Overall Cognitive Status: Within Functional Limits for tasks assessed           General Comments General comments (skin integrity, edema, etc.): Pt daughter present throughout session and very supportive. States that pt has wanted to stay in bed more and more and states that she is tired as well as reporting that she would like to see her spouse who has passed away. Pt has difficulty sleeping and is up in the middle of the night alot. 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      Pertinent Vitals/Pain Pain Assessment Pain Assessment: No/denies pain     PT Goals (current goals can now be found in the care plan section) Acute Rehab PT Goals PT Goal Formulation: With patient Time For Goal Achievement: 11/20/22 Potential to Achieve Goals: Good Progress towards PT goals: Progressing  toward goals    Frequency    Min 1X/week      PT Plan  Continue with current POC       AM-PAC PT "6 Clicks" Mobility   Outcome Measure  Help needed turning from your back to your side while in a flat bed without using bedrails?: None Help needed moving from  lying on your back to sitting on the side of a flat bed without using bedrails?: None Help needed moving to and from a bed to a chair (including a wheelchair)?: None Help needed standing up from a chair using your arms (e.g., wheelchair or bedside chair)?: None Help needed to walk in hospital room?: A Little Help needed climbing 3-5 steps with a railing? : A Little 6 Click Score: 22    End of Session   Activity Tolerance: Patient tolerated treatment well Patient left: with call bell/phone within reach;in chair;with family/visitor present Nurse Communication: Mobility status;Other (comment) (notified MD, CM and RN of pt and family needs) PT Visit Diagnosis: Other abnormalities of gait and mobility (R26.89);Muscle weakness (generalized) (M62.81);History of falling (Z91.81)     Time: 9528-4132 PT Time Calculation (min) (ACUTE ONLY): 40 min  Charges:    $Therapeutic Activity: 38-52 mins PT General Charges $$ ACUTE PT VISIT: 1 Visit                     Harrel Carina, DPT, CLT  Acute Rehabilitation Services Office: [phone] (Secure chat preferred)    Claudia Desanctis 11/09/2022, 1:53 PM

## 2022-11-09 NOTE — Plan of Care (Signed)
  Problem: Education: Goal: Knowledge of General Education information will improve Description: Including pain rating scale, medication(s)/side effects and non-pharmacologic comfort measures Outcome: Adequate for Discharge   Problem: Health Behavior/Discharge Planning: Goal: Ability to manage health-related needs will improve Outcome: Adequate for Discharge   Problem: Clinical Measurements: Goal: Ability to maintain clinical measurements within normal limits will improve Outcome: Adequate for Discharge Goal: Will remain free from infection Outcome: Adequate for Discharge Goal: Diagnostic test results will improve Outcome: Adequate for Discharge Goal: Respiratory complications will improve Outcome: Adequate for Discharge Goal: Cardiovascular complication will be avoided Outcome: Adequate for Discharge   Problem: Activity: Goal: Risk for activity intolerance will decrease Outcome: Adequate for Discharge   Problem: Nutrition: Goal: Adequate nutrition will be maintained Outcome: Adequate for Discharge   Problem: Coping: Goal: Level of anxiety will decrease Outcome: Adequate for Discharge   Problem: Elimination: Goal: Will not experience complications related to bowel motility Outcome: Adequate for Discharge Goal: Will not experience complications related to urinary retention Outcome: Adequate for Discharge   Problem: Pain Managment: Goal: General experience of comfort will improve Outcome: Adequate for Discharge   Problem: Safety: Goal: Ability to remain free from injury will improve Outcome: Adequate for Discharge   Problem: Skin Integrity: Goal: Risk for impaired skin integrity will decrease Outcome: Adequate for Discharge   Problem: Education: Goal: Knowledge of disease or condition will improve Outcome: Adequate for Discharge Goal: Knowledge of secondary prevention will improve (MUST DOCUMENT ALL) Outcome: Adequate for Discharge Goal: Knowledge of patient  specific risk factors will improve [name] N/A or DELETE if not current risk factor) Outcome: Adequate for Discharge   Problem: Intracerebral Hemorrhage Tissue Perfusion: Goal: Complications of Intracerebral Hemorrhage will be minimized Outcome: Adequate for Discharge   Problem: Coping: Goal: Will verbalize positive feelings about self Outcome: Adequate for Discharge Goal: Will identify appropriate support needs Outcome: Adequate for Discharge   Problem: Health Behavior/Discharge Planning: Goal: Ability to manage health-related needs will improve Outcome: Adequate for Discharge Goal: Goals will be collaboratively established with patient/family Outcome: Adequate for Discharge   Problem: Self-Care: Goal: Ability to participate in self-care as condition permits will improve Outcome: Adequate for Discharge Goal: Verbalization of feelings and concerns over difficulty with self-care will improve Outcome: Adequate for Discharge Goal: Ability to communicate needs accurately will improve Outcome: Adequate for Discharge   Problem: Nutrition: Goal: Risk of aspiration will decrease Outcome: Adequate for Discharge Goal: Dietary intake will improve Outcome: Adequate for Discharge

## 2022-11-09 NOTE — Discharge Instructions (Addendum)
Ms. Jackley,   Thank you for allowing [place] to take care of you. You were admitted because you passed out at the emergency room. This was felt to be most likely due to dehydration.   Please continue to drink at thirst  Please continue to drink your supplements  Please use a rolling walker and shower chair  Please do home health Physical Therapy  Please ambulate every 2 hours with assistance   Please stop taking nausea medication (ondansetron) as it can affect your heart. You are having trouble swallowing and will need to follow up with Gastroenterology for this.  Please start Pantoprazole 40mg  daily   For your subdural hematoma:  Continue Keppra 250mg  twice a day   For your blood pressure:  Please STOP all blood pressure meds: Lisinopril, hydrochlorothiazide, and amlodipine as your blood pressures were normal during your hospitalization.   Continue with your crestor 10mg  daily, vitamin D and alprazolam as you used to before.   Manuela Neptune, MD  PGY-1

## 2022-11-09 NOTE — Discharge Summary (Signed)
Name: Amber Bolton MRN: [id number] DOB: 21-Apr-1929 89 y.o. PCP: Judd Lien, PA-C  Date of Admission: 11/05/2022  4:23 PM Date of Discharge: 11/09/2022  Attending Physician: Dr. Antony Contras  DISCHARGE DIAGNOSIS:  Primary Problem: Syncope   Hospital Problems: Principal Problem:   Syncope Active Problems:   Subdural hematoma (HCC)   Nausea with vomiting   Weakness    DISCHARGE MEDICATIONS:   Allergies as of 11/09/2022       Reactions   Penicillins Rash   Shellfish Allergy Itching   Mouth itches        Medication List     STOP taking these medications    amLODipine 10 MG tablet Commonly known as: NORVASC   hydrochlorothiazide 25 MG tablet Commonly known as: HYDRODIURIL   lisinopril 40 MG tablet Commonly known as: ZESTRIL       TAKE these medications    ALPRAZolam 0.5 MG tablet Commonly known as: XANAX Take 0.25 mg by mouth at bedtime as needed for sleep.   feeding supplement Liqd Take 237 mLs by mouth 3 (three) times daily between meals.   levETIRAcetam 250 MG tablet Commonly known as: KEPPRA Take 1 tablet (250 mg total) by mouth 2 (two) times daily.   pantoprazole 40 MG tablet Commonly known as: Protonix Take 1 tablet (40 mg total) by mouth daily.   rosuvastatin 10 MG tablet Commonly known as: CRESTOR Take 1 tablet (10 mg total) by mouth daily.   traMADol 50 MG tablet Commonly known as: ULTRAM Take 50 mg by mouth 2 (two) times daily as needed for moderate pain or severe pain.   triamcinolone cream 0.1 % Commonly known as: KENALOG Apply 1 Application topically daily as needed (rash).   Vitamin D (Ergocalciferol) 1.25 MG (50000 UNIT) Caps capsule Commonly known as: DRISDOL Take 50,000 Units by mouth once a week.               Durable Medical Equipment  (From admission, onward)           Start     Ordered   11/09/22 1358  For home use only DME Walker rolling  Once       Question Answer Comment  Walker: With 5 Inch Wheels    Patient needs a walker to treat with the following condition Weakness      11/09/22 1358   11/09/22 1358  For home use only DME Other see comment  Once       Comments: Shower chair with back  Question:  Length of Need  Answer:  Lifetime   11/09/22 1358            DISPOSITION AND FOLLOW-UP:  Ms.Khai A Hedding was discharged from [facility] in Good condition. At the hospital follow up visit please address:  Follow-up Recommendations: Consults: Gastroenterology (will need to make appointment) Labs: Basic Metabolic Profile Medications: Please make sure patient has stopped ondansetron (has a prolonged Qtc) and has started Famotidine 40mg  daily  Follow-up Appointments:  Follow-up Information     [facility]. Schedule an appointment as soon as possible for a visit.   Specialty: Gastroenterology Contact information: [address] [place] [address] [phone]        Irving Copas H, PA-C Follow up.   Specialty: Physician Assistant Contact information: [phone] [place] [address] [phone]                 HOSPITAL COURSE:  Patient Summary: Amber Bolton is a 89 y.o. significant for a bilateral subdural hematomas, HFrEF, possible seizures vs TIA on 07/2022, T2DM, hypercalcemia, arthritis, HLD, and HTN, and OSA who presented with intermittent symptoms of dizziness and nausea, as well as generalized weakness, reduced appetite and increased assistance with ADLs. In the ED, she had a syncopal episode after feeling lightheaded when attempting to sit/stand up. She was admitted for Syncope.   #Syncope  #Weakness Showed Improvement after small LR bolus. Nausea and confusion resolved ON, and was able to tolerate her food. Nausea may be related to dehydration given improvement and positional nature of symptoms. Could be possible that bilateral subdural hematomas are causing it but  unlikely since not causing much mass effect on CT or neurological deficits on exam. NO acute findings on head CT.  -Encourage PO intake WITH CAUTION due to HF.  -Regular diet  -Home Health PT 3x weekly -Ambulate every 2 hours at home  -Use a rolling walker and shower chair.   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 Cannot eat as she gets nauseous when she sees food. Was given ondansetron PRN but has a prolonged QT. She was found to have esophageal dysmotility, a diverticulum in the distal esophagus, and concern for distal stricture. Will follow up with [facility] outpatient. CT Ab pelv with no acute findings. -Start Famotidine 40mg  Daily  -Please make sure that patient has followed up with GI -Please make sure patient has stopped ondansetron.   #History of TIA possible seizures  #Subdural Hematomas -Continue home Keppra 250mg  BID -Cw rosuvastatin 10mg  daily  -Spoke with neurosurgery, given age, high risk to operate.   #HFrEF  Last echo (06/18/2022) 35-40% with global hypokinesis and severe concentric hypokinesis. And Grade I diastolic dysfunction. EF improving in most recent echo done 9/7 with EF 45-50% and mildly decreased ventricular dysmotility. LVH. Not on GDMT. BP control as below. Not on exacerbation. Mild crackles on LLL at admission resolved throughout her hospitalization.   #HTN Amlodipine 10mg  daily, hydrochlorothiazide  25mg  daily, and Lisinopril 40mg  daily.  Holding due to orthostatics and normotensive currently.  Please consider restarting if indicated at follow up.    #T2DM  Hgb A1C 7.3 does not use any medications at home.  -Given lack of appetite and weakness she followed normal diet. She is 89. Tight control not necessary. Continue to monitor outpatient PRN.      DISCHARGE INSTRUCTIONS:   Discharge Instructions     (HEART FAILURE PATIENTS) Call MD:  Anytime you have any  of the following symptoms: 1) 3 pound weight gain in 24 hours or 5 pounds in 1 week 2) shortness of breath, with or without a dry hacking cough 3) swelling in the hands, feet or stomach 4) if you have to sleep on extra pillows at night in order to breathe.   Complete by: As directed    Activity as tolerated - No restrictions   Complete by: As directed    Call MD for:  difficulty breathing, headache or visual disturbances   Complete by: As directed    Call MD for:  extreme fatigue   Complete by: As directed    Call MD for:  persistant dizziness or light-headedness   Complete by: As directed    Diet - low sodium heart healthy   Complete by: As directed    Walker    Complete by: As directed  SUBJECTIVE:   Feels a lot better but still has some chronic nausea and globus sensation.   Discharge Vitals:   BP 117/69 (BP Location: Left Arm)   Pulse 77   Temp 98.3 F (36.8 C) (Oral)   Resp 16   Ht 4\' 11"  (1.499 m)   Wt 57.9 kg   SpO2 95%   BMI 25.78 kg/m   OBJECTIVE:  Physical Exam Constitutional:      General: She is not in acute distress.    Appearance: Normal appearance.  HENT:     Mouth/Throat:     Mouth: Mucous membranes are moist.  Cardiovascular:     Rate and Rhythm: Normal rate and regular rhythm.     Heart sounds: No murmur heard.    No friction rub. No gallop.  Pulmonary:     Effort: No respiratory distress.     Breath sounds: No wheezing, rhonchi or rales.  Abdominal:     General: Bowel sounds are normal. There is no distension.     Palpations: Abdomen is soft.     Tenderness: There is no abdominal tenderness. There is no guarding or rebound.  Musculoskeletal:     Right lower leg: No edema.     Left lower leg: No edema.  Neurological:     Mental Status: She is alert.    Pertinent Labs, Studies, and Procedures:     Latest Ref Rng & Units 11/05/2022    5:53 PM 07/09/2022    3:30 PM 06/18/2022    3:21 AM  CBC  WBC 4.0 - 10.5 K/uL 7.3  5.5  4.8    Hemoglobin 12.0 - 15.0 g/dL 16.1  09.6  04.5   Hematocrit 36.0 - 46.0 % 45.6  48.5  46.9   Platelets 150 - 400 K/uL 182  143  154        Latest Ref Rng & Units 11/09/2022   10:37 AM 11/07/2022    4:52 AM 11/05/2022    5:53 PM  CMP  Glucose 70 - 99 mg/dL 409  811  914   BUN 8 - 23 mg/dL 17  16  21    Creatinine 0.44 - 1.00 mg/dL 7.82  9.56  2.13   Sodium 135 - 145 mmol/L 134  134  139   Potassium 3.5 - 5.1 mmol/L 3.6  3.7  3.8   Chloride 98 - 111 mmol/L 104  101  100   CO2 22 - 32 mmol/L 23  26  26    Calcium 8.9 - 10.3 mg/dL 9.5  9.5  08.6   Total Protein 6.5 - 8.1 g/dL   6.1   Total Bilirubin 0.3 - 1.2 mg/dL   0.3   Alkaline Phos 38 - 126 U/L   47   AST 15 - 41 U/L   17   ALT 0 - 44 U/L   14     DG ESOPHAGUS W SINGLE CM (SOL OR THIN BA)  Result Date: 11/09/2022 CLINICAL DATA:  Provided history: Dysphagia. Additional history provided: Coughing during and after meals, throat discomfort, early satiety. EXAM: ESOPHAGUS/BARIUM SWALLOW/TABLET STUDY TECHNIQUE: A single contrast esophagram was performed using thin liquid barium. The exam was performed by Alwyn Ren, NP, and was supervised and interpreted by Dr. Jackey Loge. FLUOROSCOPY: Radiation Exposure Index (as provided by the fluoroscopic device): 20.20 mGy Kerma COMPARISON:  CT abdomen/pelvis 11/06/2022. Report from esophagram 06/14/2020 (images unavailable). FINDINGS: Diverticulum arising from the distal esophagus (just above the GE junction), measuring approximately 1.5 cm. A  swallowed 13 mm barium tablet did not pass beyond the level of the distal esophagus despite a prolonged period of observation, and despite the patient taking additional swallows of water and liquid barium contrast. Mild esophageal dysmotility with tertiary contractions. No appreciable hiatal hernia. No gastroesophageal reflux observed. IMPRESSION: 1. Diverticulum arising from the distal esophagus (just above the GE junction), measuring approximately 1.5 cm. 2. A  swallowed 13 mm barium tablet did not pass beyond the level of the distal esophagus despite a prolonged period of observation, and despite the patient taking additional swallows of water and liquid barium contrast. This is concerning for a possible distal esophageal stricture, and endoscopy should be considered for further evaluation. 3. Mild esophageal dysmotility. Electronically Signed   By: Jackey Loge D.O.   On: 11/09/2022 15:19   ECHOCARDIOGRAM COMPLETE  Result Date: 11/07/2022    ECHOCARDIOGRAM REPORT   Patient Name:   JAYELYN BANTHER Date of Exam: 11/07/2022 Medical Rec #:  [id number]       Height:       59.0 in Accession #:    [id number]      Weight:       122.4 lb Date of Birth:  1929/12/21       BSA:          1.497 m Patient Age:    89 years        BP:           123/76 mmHg Patient Gender: F               HR:           77 bpm. Exam Location:  Inpatient Procedure: 2D Echo, Color Doppler and Cardiac Doppler Indications:     syncope  History:         Patient has prior history of Echocardiogram examinations, most                  recent 06/18/2022. Signs/Symptoms:Syncope; Risk                  Factors:Diabetes, Hypertension and Sleep Apnea.  Sonographer:     Delcie Roch RDCS Referring Phys:  [id number] GRACE LAU Diagnosing Phys: Epifanio Lesches MD IMPRESSIONS  1. Left ventricular ejection fraction, by estimation, is 45 to 50%. The left ventricle has mildly decreased function. The left ventricle demonstrates regional wall motion abnormalities. Septal hypokinesis. There is moderate asymmetric left ventricular hypertrophy of the basal-septal segment. Left ventricular diastolic parameters are indeterminate.  2. Right ventricular systolic function is normal. The right ventricular size is normal. There is normal pulmonary artery systolic pressure. The estimated right ventricular systolic pressure is 30.5 mmHg.  3. A small pericardial effusion is present.  4. The mitral valve is normal in structure. Trivial  mitral valve regurgitation.  5. The aortic valve is tricuspid. Aortic valve regurgitation is trivial. Aortic valve sclerosis/calcification is present, without any evidence of aortic stenosis.  6. Aortic dilatation noted. There is dilatation of the ascending aorta, measuring 41 mm.  7. The inferior vena cava is normal in size with greater than 50% respiratory variability, suggesting right atrial pressure of 3 mmHg. FINDINGS  Left Ventricle: Left ventricular ejection fraction, by estimation, is 45 to 50%. The left ventricle has mildly decreased function. The left ventricle demonstrates regional wall motion abnormalities. The left ventricular internal cavity size was small. There is moderate asymmetric left ventricular hypertrophy of the basal-septal segment. Left ventricular diastolic parameters are indeterminate. Right Ventricle: The right  ventricular size is normal. No increase in right ventricular wall thickness. Right ventricular systolic function is normal. There is normal pulmonary artery systolic pressure. The tricuspid regurgitant velocity is 2.62 m/s, and  with an assumed right atrial pressure of 3 mmHg, the estimated right ventricular systolic pressure is 30.5 mmHg. Left Atrium: Left atrial size was normal in size. Right Atrium: Right atrial size was not well visualized. Pericardium: A small pericardial effusion is present. Mitral Valve: The mitral valve is normal in structure. Trivial mitral valve regurgitation. Tricuspid Valve: The tricuspid valve is normal in structure. Tricuspid valve regurgitation is trivial. Aortic Valve: The aortic valve is tricuspid. Aortic valve regurgitation is trivial. Aortic valve sclerosis/calcification is present, without any evidence of aortic stenosis. Pulmonic Valve: The pulmonic valve was not well visualized. Pulmonic valve regurgitation is trivial. Aorta: Aortic dilatation noted. There is dilatation of the ascending aorta, measuring 41 mm. Venous: The inferior vena cava is  normal in size with greater than 50% respiratory variability, suggesting right atrial pressure of 3 mmHg. IAS/Shunts: The interatrial septum was not well visualized.  LEFT VENTRICLE PLAX 2D LVIDd:         3.70 cm LVIDs:         2.80 cm LV PW:         1.00 cm LV IVS:        1.50 cm LVOT diam:     2.00 cm LV SV:         63 LV SV Index:   42 LVOT Area:     3.14 cm  RIGHT VENTRICLE          IVC RV Basal diam:  2.00 cm  IVC diam: 1.10 cm LEFT ATRIUM             Index LA diam:        3.80 cm 2.54 cm/m LA Vol (A2C):   26.8 ml 17.91 ml/m LA Vol (A4C):   32.0 ml 21.38 ml/m LA Biplane Vol: 29.5 ml 19.71 ml/m  AORTIC VALVE LVOT Vmax:   133.00 cm/s LVOT Vmean:  91.200 cm/s LVOT VTI:    0.201 m  AORTA Ao Asc diam: 3.80 cm TRICUSPID VALVE TR Peak grad:   27.5 mmHg TR Vmax:        262.00 cm/s  SHUNTS Systemic VTI:  0.20 m Systemic Diam: 2.00 cm Epifanio Lesches MD Electronically signed by Epifanio Lesches MD Signature Date/Time: 11/07/2022/9:22:07 PM    Final (Updated)    CT ABDOMEN PELVIS W CONTRAST  Result Date: 11/06/2022 CLINICAL DATA:  Abdominal pain, vomiting and weakness. EXAM: CT ABDOMEN AND PELVIS WITH CONTRAST TECHNIQUE: Multidetector CT imaging of the abdomen and pelvis was performed using the standard protocol following bolus administration of intravenous contrast. RADIATION DOSE REDUCTION: This exam was performed according to the departmental dose-optimization program which includes automated exposure control, adjustment of the mA and/or kV according to patient size and/or use of iterative reconstruction technique. CONTRAST:  75mL OMNIPAQUE IOHEXOL 350 MG/ML SOLN COMPARISON:  08/24/2021 FINDINGS: Lower chest: No acute abnormality. Hepatobiliary: No focal liver abnormality is seen. No gallstones, gallbladder wall thickening, or biliary dilatation. Pancreas: Unremarkable. No pancreatic ductal dilatation or surrounding inflammatory changes. Spleen: Normal in size without focal abnormality. Adrenals/Urinary  Tract: Adrenal glands are unremarkable. Kidneys are normal, without renal calculi, focal lesion, or hydronephrosis. Bladder is unremarkable. Stomach/Bowel: Bowel shows no evidence of obstruction, ileus, inflammation or lesion. The appendix is not discretely visualized. No free intraperitoneal air. Stable diverticulosis of the colon without  evidence of acute diverticulitis. Vascular/Lymphatic: Atherosclerosis of the aorta noted with stable mild fusiform dilatation of the visualized distal thoracic aorta measuring up to 3.2 cm with associated slightly more prominent smoothly contoured anterior mural thrombus associated with atherosclerotic plaque. No evidence of abdominal aortic aneurysm. No lymphadenopathy identified. Reproductive: Status post hysterectomy. No adnexal masses. Other: No abdominal wall hernia or abnormality. No abdominopelvic ascites. Musculoskeletal: Stable diffuse degenerative disc disease throughout the spine with associated mild scoliosis. Stable appearance right hip arthroplasty IMPRESSION: 1. No acute findings in the abdomen or pelvis. 2. Stable mild fusiform dilatation of the visualized distal thoracic aorta measuring up to 3.2 cm with associated slightly more prominent smoothly contoured anterior mural thrombus associated with atherosclerotic plaque. 3. Stable diverticulosis of the colon without evidence of acute diverticulitis. 4. Aortic atherosclerosis. Aortic Atherosclerosis (ICD10-I70.0). Electronically Signed   By: Irish Lack M.D.   On: 11/06/2022 08:43   DG Chest 2 View  Result Date: 11/05/2022 CLINICAL DATA:  Weakness.  Fatigue. EXAM: CHEST - 2 VIEW COMPARISON:  07/09/2022 FINDINGS: Chronic cardiomegaly. Unchanged mediastinal contours. Normal pulmonary vasculature. No pleural effusion, focal airspace disease, or pneumothorax. Chronic bilateral shoulder arthropathy. IMPRESSION: Chronic cardiomegaly. No acute chest findings. Electronically Signed   By: Narda Rutherford M.D.   On:  11/05/2022 19:47   CT Head Wo Contrast  Result Date: 11/05/2022 CLINICAL DATA:  history of sdh, n/v, weak EXAM: CT HEAD WITHOUT CONTRAST TECHNIQUE: Contiguous axial images were obtained from the base of the skull through the vertex without intravenous contrast. RADIATION DOSE REDUCTION: This exam was performed according to the departmental dose-optimization program which includes automated exposure control, adjustment of the mA and/or kV according to patient size and/or use of iterative reconstruction technique. COMPARISON:  Head CT 07/09/2022 FINDINGS: Brain: Chronic isodense subdural collections. On the right, this measures up to 4 mm and is unchanged, series 5, image 26. On the left this has slightly increased in size measuring 5 mm, series 5, image 19, previously 3 mm. No hyperdense or acute hemorrhagic component. No associated mass effect. Stable degree of atrophy and chronic small vessel ischemia. No evidence of acute ischemia. No hydrocephalus. Vascular: No hyperdense vessel or unexpected calcification. Skull: No fracture or focal lesion. Sinuses/Orbits: No acute finding.  Bilateral cataract resection. Other: None. IMPRESSION: 1. Chronic isodense subdural collections. On the right, this measures up to 4 mm and is unchanged from May exam. On the left this has slightly increased in size measuring 5 mm, previously 3 mm. No hyperdense or acute hemorrhagic component. No associated mass effect. 2. Stable atrophy and chronic small vessel ischemia. Electronically Signed   By: Narda Rutherford M.D.   On: 11/05/2022 19:46     Signed: Manuela Neptune, MD Internal Medicine Resident, PGY-1 [facility]  Pager: [phone] 6:34 PM, 11/09/2022

## 2022-11-09 NOTE — TOC Initial Note (Addendum)
Transition of Care (TOC) - Initial/Assessment Note  Spoke to patient and daughter Nettie Elm at bedside. Patient from home with her son. Daughter Nettie Elm also assist with her mom at home. Patient only  alone to one to two hours at a time.   PT recommendation for HHPT. Patient very weak, and daughter wanting to be sure she is safe to go home at discharge. PT signed in to see patient today. Secure chatted PT daughters, concerns and daughter would like to be present when PT works with her mom to see exactly what assistance she will need at home.   Patient has bedside commode, wheelchair and walker at home   Patient active with [facility] . Kandee Keen with [facility] aware    PT worked with patient and spoke to daughter. PT recommending rolling walker and shower chair. Patient has rollator at home. Patient and daughter in agreement.   Kandee Keen with [facility] accepted referral for HHPT and aide. At most aide will visit twice a week for bath only. Daughter and patient aware and in agreement. Asked team for orders. Called [employer] for rolling walker and shower chair with back  Patient Details  Name: Amber Bolton MRN: [id number] Date of Birth: May 17, 1929  Transition of Care [facility]) CM/SW Contact:    Kingsley Plan, RN Phone Number: 11/09/2022, 12:27 PM  Clinical Narrative:                   Expected Discharge Plan: Home w Home Health Services     Patient Goals and CMS Choice Patient states their goals for this hospitalization and ongoing recovery are:: see note CMS Medicare.gov Compare Post Acute Care list provided to:: Patient Choice offered to / list presented to : Adult Children [place] ownership interest in [facility].provided to:: Adult Children (daughter Nettie Elm)    Expected Discharge Plan and Services   Discharge Planning Services: CM Consult Post Acute Care Choice: Home Health Living arrangements for the past 2 months: Single Family Home                   DME Agency: NA       HH  Arranged: PT HH Agency: [facility] Date [facility] Agency Contacted: 11/09/22 Time HH Agency Contacted: [phone] Representative spoke with at [facility] Agency: Kandee Keen will need orders  Prior Living Arrangements/Services Living arrangements for the past 2 months: Single Family Home Lives with:: Adult Children Patient language and need for interpreter reviewed:: Yes Do you feel safe going back to the place where you live?: Yes      Need for Family Participation in Patient Care: Yes (Comment) Care giver support system in place?: Yes (comment) Current home services: DME Criminal Activity/Legal Involvement Pertinent to Current Situation/Hospitalization: No - Comment as needed  Activities of Daily Living Home Assistive Devices/Equipment: Environmental consultant (specify type), Wheelchair ADL Screening (condition at time of admission) Patient's cognitive ability adequate to safely complete daily activities?: Yes Is the patient deaf or have difficulty hearing?: No Does the patient have difficulty seeing, even when wearing glasses/contacts?: Yes Does the patient have difficulty concentrating, remembering, or making decisions?: No Patient able to express need for assistance with ADLs?: Yes Does the patient have difficulty dressing or bathing?: Yes Independently performs ADLs?: Yes (appropriate for developmental age) Does the patient have difficulty walking or climbing stairs?: Yes Weakness of Legs: Both Weakness of Arms/Hands: None  Permission Sought/Granted   Permission granted to share information with : Yes, Verbal Permission Granted  Share Information  with NAME: daughter Nettie Elm           Emotional Assessment Appearance:: Appears stated age Attitude/Demeanor/Rapport: Engaged Affect (typically observed): Accepting Orientation: : Oriented to Self, Oriented to Place, Oriented to  Time, Oriented to Situation Alcohol / Substance Use: Not Applicable Psych Involvement: No (comment)  Admission diagnosis:   Syncope [R55] Weakness [R53.1] Syncope, unspecified syncope type [R55] Nausea and vomiting, unspecified vomiting type [R11.2] Patient Active Problem List   Diagnosis Date Noted   Weakness 11/07/2022   Syncope 11/06/2022   Nausea with vomiting 11/06/2022   Subdural hematoma (HCC) 06/16/2022   Resistant hypertension 03/09/2022   Expected blood loss anemia 08/24/2012   Obesity (BMI 30-39.9) 08/24/2012   S/P right THA, AA 08/23/2012   PCP:  Judd Lien, PA-C Pharmacy:   [id number] - [place], [place] - [address] [place] [address] Phone: [phone] Fax: [phone]     Social Determinants of Health (SDOH) Social History: SDOH Screenings   Food Insecurity: No Food Insecurity (11/06/2022)  Housing: Patient Declined (11/06/2022)  Transportation Needs: No Transportation Needs (11/06/2022)  Utilities: Not At Risk (11/06/2022)  Tobacco Use: Low Risk  (11/06/2022)   SDOH Interventions:     Readmission Risk Interventions     No data to display

## 2022-11-09 NOTE — Plan of Care (Signed)
  Problem: Education: Goal: Knowledge of General Education information will improve Description: Including pain rating scale, medication(s)/side effects and non-pharmacologic comfort measures Outcome: Progressing   Problem: Health Behavior/Discharge Planning: Goal: Ability to manage health-related needs will improve Outcome: Progressing   Problem: Clinical Measurements: Goal: Ability to maintain clinical measurements within normal limits will improve Outcome: Progressing Goal: Will remain free from infection Outcome: Progressing Goal: Diagnostic test results will improve Outcome: Progressing Goal: Respiratory complications will improve Outcome: Progressing Goal: Cardiovascular complication will be avoided Outcome: Progressing   Problem: Activity: Goal: Risk for activity intolerance will decrease Outcome: Progressing   Problem: Nutrition: Goal: Adequate nutrition will be maintained Outcome: Progressing   Problem: Coping: Goal: Level of anxiety will decrease Outcome: Progressing   Problem: Elimination: Goal: Will not experience complications related to bowel motility Outcome: Progressing Goal: Will not experience complications related to urinary retention Outcome: Progressing   Problem: Pain Managment: Goal: General experience of comfort will improve Outcome: Progressing   Problem: Safety: Goal: Ability to remain free from injury will improve Outcome: Progressing   Problem: Skin Integrity: Goal: Risk for impaired skin integrity will decrease Outcome: Progressing   Problem: Education: Goal: Knowledge of disease or condition will improve Outcome: Progressing Goal: Knowledge of secondary prevention will improve (MUST DOCUMENT ALL) Outcome: Progressing Goal: Knowledge of patient specific risk factors will improve [name] N/A or DELETE if not current risk factor) Outcome: Progressing   Problem: Intracerebral Hemorrhage Tissue Perfusion: Goal: Complications of  Intracerebral Hemorrhage will be minimized Outcome: Progressing   Problem: Coping: Goal: Will verbalize positive feelings about self Outcome: Progressing Goal: Will identify appropriate support needs Outcome: Progressing   Problem: Health Behavior/Discharge Planning: Goal: Ability to manage health-related needs will improve Outcome: Progressing Goal: Goals will be collaboratively established with patient/family Outcome: Progressing   Problem: Self-Care: Goal: Ability to participate in self-care as condition permits will improve Outcome: Progressing Goal: Verbalization of feelings and concerns over difficulty with self-care will improve Outcome: Progressing Goal: Ability to communicate needs accurately will improve Outcome: Progressing   Problem: Nutrition: Goal: Risk of aspiration will decrease Outcome: Progressing Goal: Dietary intake will improve Outcome: Progressing  Tyrica Afzal Tamera Stands, RN

## 2022-11-11 ENCOUNTER — Telehealth: Payer: Self-pay

## 2022-11-11 NOTE — Telephone Encounter (Signed)
-----   Message from Doree Albee sent at 11/09/2022  4:38 PM EDT ----- Regarding: Hospital follow up for dysphagia Asked to set up outpatient hospital follow up, did not formally see in the hospital.  For dysphagia, Can be with App in 3-4 months.  Thanks, Marchelle Folks

## 2022-11-11 NOTE — Telephone Encounter (Signed)
Pt scheduled to see Quentin Mulling PA 01/22/23 at 2:30pm. Appt letter mailed to pt.

## 2023-01-09 ENCOUNTER — Other Ambulatory Visit: Payer: Self-pay | Admitting: Student

## 2023-01-20 NOTE — Progress Notes (Deleted)
01/20/2023 Amber Bolton [id number] [birth year]  Referring provider: Judd Lien, PA-C Primary GI doctor: {acdocs:[id number]}  ASSESSMENT AND PLAN:   Assessment and Plan              Patient Care Team: Barrington Ellison as PCP - General (Physician Assistant)  HISTORY OF PRESENT ILLNESS: 89 y.o. female with a past medical history of chronic SDH, resistant hypertension, OSA, CKD stage IIIb, type 2 diabetes, HFrEF (35 to 40%), cognitive impairment and others listed below presents for evaluation of ***.    Discussed the use of AI scribe software for clinical note transcription with the patient, who gave verbal consent to proceed.  History of Present Illness             She {Actions; denies-reports:120008} blood thinner use.  She {Actions; denies-reports:120008} NSAID use.  She {Actions; denies-reports:120008} ETOH use.   She {Actions; denies-reports:120008} tobacco use.  She {Actions; denies-reports:120008} drug use.    She  reports that she has never smoked. She has never used smokeless tobacco. She reports that she does not drink alcohol and does not use drugs.  RELEVANT LABS AND IMAGING:  Results          CBC    Component Value Date/Time   WBC 7.3 11/05/2022 1753   RBC 4.98 11/05/2022 1753   HGB 14.9 11/05/2022 1753   HCT 45.6 11/05/2022 1753   PLT 182 11/05/2022 1753   MCV 91.6 11/05/2022 1753   MCH 29.9 11/05/2022 1753   MCHC 32.7 11/05/2022 1753   RDW 11.9 11/05/2022 1753   LYMPHSABS 1.5 07/09/2022 1530   MONOABS 0.6 07/09/2022 1530   EOSABS 0.1 07/09/2022 1530   BASOSABS 0.1 07/09/2022 1530   Recent Labs    06/16/22 1225 06/16/22 1230 06/17/22 0227 06/18/22 0321 07/09/22 1530 11/05/22 1753  HGB 16.2* 17.0* 15.2* 14.6 15.8* 14.9    CMP     Component Value Date/Time   NA 134 (L) 11/09/2022 1037   K 3.6 11/09/2022 1037   CL 104 11/09/2022 1037   CO2 23 11/09/2022 1037   GLUCOSE 192 (H) 11/09/2022 1037   BUN 17 11/09/2022 1037    CREATININE 1.30 (H) 11/09/2022 1037   CALCIUM 9.5 11/09/2022 1037   PROT 6.1 (L) 11/05/2022 1753   ALBUMIN 3.0 (L) 11/09/2022 1037   AST 17 11/05/2022 1753   ALT 14 11/05/2022 1753   ALKPHOS 47 11/05/2022 1753   BILITOT 0.3 11/05/2022 1753   GFRNONAA 39 (L) 11/09/2022 1037   GFRAA 52 (L) 12/31/2015 1623      Latest Ref Rng & Units 11/09/2022   10:37 AM 11/05/2022    5:53 PM 07/09/2022    3:30 PM  Hepatic Function  Total Protein 6.5 - 8.1 g/dL  6.1  6.1   Albumin 3.5 - 5.0 g/dL 3.0  3.1  3.2   AST 15 - 41 U/L  17  16   ALT 0 - 44 U/L  14  12   Alk Phosphatase 38 - 126 U/L  47  50   Total Bilirubin 0.3 - 1.2 mg/dL  0.3  0.9       Current Medications:    Current Outpatient Medications (Cardiovascular):    rosuvastatin (CRESTOR) 10 MG tablet, Take 1 tablet (10 mg total) by mouth daily.   Current Outpatient Medications (Analgesics):    traMADol (ULTRAM) 50 MG tablet, Take 50 mg by mouth 2 (two) times daily as needed for moderate pain or severe  pain.   Current Outpatient Medications (Other):    ALPRAZolam (XANAX) 0.5 MG tablet, Take 0.25 mg by mouth at bedtime as needed for sleep.    feeding supplement (ENSURE ENLIVE / ENSURE PLUS) LIQD, Take 237 mLs by mouth 3 (three) times daily between meals.   levETIRAcetam (KEPPRA) 250 MG tablet, Take 1 tablet (250 mg total) by mouth 2 (two) times daily.   pantoprazole (PROTONIX) 40 MG tablet, Take 1 tablet (40 mg total) by mouth daily.   triamcinolone cream (KENALOG) 0.1 %, Apply 1 Application topically daily as needed (rash).   Vitamin D, Ergocalciferol, (DRISDOL) 1.25 MG (50000 UNIT) CAPS capsule, Take 50,000 Units by mouth once a week.  Medical History:  Past Medical History:  Diagnosis Date   Arthritis    IN RIGHT HIP AND FINGERS AND BACK   Bilateral subdural hematomas (HCC)    Diabetes (HCC)    HFrEF (heart failure with reduced ejection fraction) (HCC)    Hypercalcemia    Hyperlipemia    Resistant hypertension 03/09/2022    Sleep apnea    STATES SHE HAS CPAP - BUT HAS NOT USED IN THE LAST 2 YRS   TIA (transient ischemic attack)    Allergies:  Allergies  Allergen Reactions   Penicillins Rash   Shellfish Allergy Itching    Mouth itches     Surgical History:  She  has a past surgical history that includes Abdominal hysterectomy (1978); Hemorrhoid surgery (1960'S); and Total hip arthroplasty (Right, 08/23/2012). Family History:  Her family history includes Breast cancer in her mother.  REVIEW OF SYSTEMS  : All other systems reviewed and negative except where noted in the History of Present Illness.  PHYSICAL EXAM: There were no vitals taken for this visit. General Appearance: Well nourished, in no apparent distress. Head:   Normocephalic and atraumatic. Eyes:  sclerae anicteric,conjunctive pink  Respiratory: Respiratory effort normal, BS equal bilaterally without rales, rhonchi, wheezing. Cardio: RRR with no MRGs. Peripheral pulses intact.  Abdomen: Soft,  {BlankSingle:19197::"Flat","Obese","Non-distended"} ,active bowel sounds. {actendernessAB:27319} tenderness {anatomy; site abdomen:5010}. {BlankMultiple:19196::"Without guarding","With guarding","Without rebound","With rebound"}. No masses. Rectal: {acrectalexam:27461} Musculoskeletal: Full ROM, {PSY - GAIT AND STATION:22860} gait. {With/Without:304960234} edema. Skin:  Dry and intact without significant lesions or rashes Neuro: Alert and  oriented x4;  No focal deficits. Psych:  Cooperative. Normal mood and affect.    Doree Albee, PA-C 10:51 AM

## 2023-01-22 ENCOUNTER — Ambulatory Visit: Payer: Medicare Other | Admitting: Physician Assistant

## 2023-02-08 ENCOUNTER — Telehealth: Payer: Self-pay | Admitting: Neurology

## 2023-02-08 NOTE — Telephone Encounter (Signed)
Patient has no transportation her daughter  has the car in the shop. Next available was July so not sure if Dr. Pearlean Brownie needs to see her sooner and if you could get her in sooner.

## 2023-02-09 ENCOUNTER — Ambulatory Visit: Payer: Medicare Other | Admitting: Neurology

## 2023-02-10 ENCOUNTER — Ambulatory Visit: Payer: Medicare Other | Admitting: Physician Assistant

## 2023-02-23 NOTE — Telephone Encounter (Signed)
Phone rep called pt's daughter to offer the next available appointment with a NP, vm came on and ended the call before a message could be left to request a call for scheduling an appointment.

## 2023-03-09 ENCOUNTER — Inpatient Hospital Stay (HOSPITAL_COMMUNITY)
Admission: EM | Admit: 2023-03-09 | Discharge: 2023-03-14 | DRG: 300 | Disposition: A | Discharge status: Home Health | Payer: 59 | Attending: Internal Medicine | Admitting: Internal Medicine

## 2023-03-09 ENCOUNTER — Ambulatory Visit: Payer: 59 | Admitting: Pediatrics

## 2023-03-09 ENCOUNTER — Encounter (HOSPITAL_COMMUNITY): Payer: Self-pay

## 2023-03-09 ENCOUNTER — Emergency Department (HOSPITAL_COMMUNITY): Payer: 59

## 2023-03-09 ENCOUNTER — Other Ambulatory Visit: Payer: Self-pay

## 2023-03-09 DIAGNOSIS — E876 Hypokalemia: Secondary | ICD-10-CM | POA: Diagnosis present

## 2023-03-09 DIAGNOSIS — I71012 Dissection of descending thoracic aorta: Principal | ICD-10-CM

## 2023-03-09 DIAGNOSIS — Z66 Do not resuscitate: Secondary | ICD-10-CM | POA: Diagnosis present

## 2023-03-09 DIAGNOSIS — Z803 Family history of malignant neoplasm of breast: Secondary | ICD-10-CM

## 2023-03-09 DIAGNOSIS — F419 Anxiety disorder, unspecified: Secondary | ICD-10-CM | POA: Diagnosis present

## 2023-03-09 DIAGNOSIS — D751 Secondary polycythemia: Secondary | ICD-10-CM | POA: Diagnosis present

## 2023-03-09 DIAGNOSIS — Z91013 Allergy to seafood: Secondary | ICD-10-CM

## 2023-03-09 DIAGNOSIS — E1122 Type 2 diabetes mellitus with diabetic chronic kidney disease: Secondary | ICD-10-CM | POA: Diagnosis present

## 2023-03-09 DIAGNOSIS — I7100 Dissection of unspecified site of aorta: Secondary | ICD-10-CM | POA: Diagnosis not present

## 2023-03-09 DIAGNOSIS — Z91199 Patient's noncompliance with other medical treatment and regimen due to unspecified reason: Secondary | ICD-10-CM

## 2023-03-09 DIAGNOSIS — Z8673 Personal history of transient ischemic attack (TIA), and cerebral infarction without residual deficits: Secondary | ICD-10-CM

## 2023-03-09 DIAGNOSIS — E872 Acidosis, unspecified: Secondary | ICD-10-CM | POA: Diagnosis present

## 2023-03-09 DIAGNOSIS — N179 Acute kidney failure, unspecified: Secondary | ICD-10-CM | POA: Diagnosis not present

## 2023-03-09 DIAGNOSIS — I130 Hypertensive heart and chronic kidney disease with heart failure and stage 1 through stage 4 chronic kidney disease, or unspecified chronic kidney disease: Secondary | ICD-10-CM | POA: Diagnosis present

## 2023-03-09 DIAGNOSIS — I5022 Chronic systolic (congestive) heart failure: Secondary | ICD-10-CM | POA: Diagnosis present

## 2023-03-09 DIAGNOSIS — G4733 Obstructive sleep apnea (adult) (pediatric): Secondary | ICD-10-CM | POA: Diagnosis present

## 2023-03-09 DIAGNOSIS — B029 Zoster without complications: Secondary | ICD-10-CM | POA: Diagnosis present

## 2023-03-09 DIAGNOSIS — E785 Hyperlipidemia, unspecified: Secondary | ICD-10-CM | POA: Diagnosis present

## 2023-03-09 DIAGNOSIS — I169 Hypertensive crisis, unspecified: Secondary | ICD-10-CM | POA: Diagnosis present

## 2023-03-09 DIAGNOSIS — N1831 Chronic kidney disease, stage 3a: Secondary | ICD-10-CM | POA: Diagnosis present

## 2023-03-09 DIAGNOSIS — H9190 Unspecified hearing loss, unspecified ear: Secondary | ICD-10-CM | POA: Diagnosis present

## 2023-03-09 DIAGNOSIS — R54 Age-related physical debility: Secondary | ICD-10-CM | POA: Diagnosis present

## 2023-03-09 DIAGNOSIS — K219 Gastro-esophageal reflux disease without esophagitis: Secondary | ICD-10-CM | POA: Diagnosis present

## 2023-03-09 DIAGNOSIS — Z96641 Presence of right artificial hip joint: Secondary | ICD-10-CM | POA: Diagnosis present

## 2023-03-09 DIAGNOSIS — Z880 Allergy status to penicillin: Secondary | ICD-10-CM

## 2023-03-09 DIAGNOSIS — G40909 Epilepsy, unspecified, not intractable, without status epilepticus: Secondary | ICD-10-CM | POA: Diagnosis present

## 2023-03-09 DIAGNOSIS — E1165 Type 2 diabetes mellitus with hyperglycemia: Secondary | ICD-10-CM | POA: Diagnosis present

## 2023-03-09 DIAGNOSIS — Z79899 Other long term (current) drug therapy: Secondary | ICD-10-CM

## 2023-03-09 DIAGNOSIS — I7121 Aneurysm of the ascending aorta, without rupture: Secondary | ICD-10-CM | POA: Diagnosis present

## 2023-03-09 NOTE — ED Provider Notes (Signed)
 [place] [facility] AT [facility] Provider Note   CSN: [id number] Arrival date & time: 03/09/23  2224     History Chief Complaint  Patient presents with   Chest Pain    Amber Bolton is a 89 y.o. female with h/o HTN presents to the ER for evaluation of right sided chest pain that has been intermittent since yesterday.  She reports that she feels like it goes to her right side to the top of her shoulder down the lateral aspect of her right back down to her bottom.  Denies any shortness of breath or any cough or cold symptoms.  Denies any exacerbating relieving factors to her pain.  She is has been trying topical creams to the area however has not had any relief.  Denies any traumas or falls.  She reports that occasionally she will feel it go down her right arm.   Chest Pain Associated symptoms: back pain   Associated symptoms: no abdominal pain, no cough, no dizziness, no fever, no nausea, no palpitations, no shortness of breath and no vomiting        Home Medications Prior to Admission medications   Medication Sig Start Date End Date Taking? Authorizing Provider  ALPRAZolam  (XANAX ) 0.5 MG tablet Take 0.25 mg by mouth at bedtime as needed for sleep.     [provider]  feeding supplement (ENSURE ENLIVE / ENSURE PLUS) LIQD Take 237 mLs by mouth 3 (three) times daily between meals. 11/09/22   Alexander-Savino, [place], MD  levETIRAcetam  (KEPPRA ) 250 MG tablet Take 1 tablet (250 mg total) by mouth 2 (two) times daily. 06/18/22   Judithe Rocky BROCKS, NP  pantoprazole  (PROTONIX ) 40 MG tablet Take 1 tablet (40 mg total) by mouth daily. 11/09/22 11/09/23  Alexander-Savino, [place], MD  rosuvastatin  (CRESTOR ) 10 MG tablet Take 1 tablet (10 mg total) by mouth daily. 06/19/22   Judithe Rocky BROCKS, NP  traMADol  (ULTRAM ) 50 MG tablet Take 50 mg by mouth 2 (two) times daily as needed for moderate pain or severe pain.    [provider]  triamcinolone cream (KENALOG) 0.1  % Apply 1 Application topically daily as needed (rash).    [provider]  Vitamin D, Ergocalciferol, (DRISDOL) 1.25 MG (50000 UNIT) CAPS capsule Take 50,000 Units by mouth once a week.    [provider]      Allergies    Penicillins and Shellfish allergy    Review of Systems   Review of Systems  Constitutional:  Negative for chills and fever.  Respiratory:  Negative for cough and shortness of breath.   Cardiovascular:  Positive for chest pain. Negative for palpitations.  Gastrointestinal:  Negative for abdominal pain, constipation, diarrhea, nausea and vomiting.  Musculoskeletal:  Positive for back pain.  Neurological:  Negative for dizziness and light-headedness.    Physical Exam Updated Vital Signs BP (!) 159/115   Pulse 78   Temp 99.8 F (37.7 C) (Rectal)   Resp 14   Ht 4' 11 (1.499 m)   Wt 57.9 kg   SpO2 96%   BMI 25.78 kg/m  Physical Exam Vitals and nursing note reviewed.  Constitutional:      General: She is not in acute distress.    Appearance: She is not toxic-appearing.     Comments: Resting, but easily awakens  Cardiovascular:     Pulses:          Radial pulses are 2+ on the right side and 2+ on the  left side.       Dorsalis pedis pulses are 2+ on the right side and 2+ on the left side.       Posterior tibial pulses are 2+ on the right side and 2+ on the left side.  Pulmonary:     Effort: Pulmonary effort is normal. No respiratory distress.     Breath sounds: No decreased breath sounds.  Chest:     Chest wall: No tenderness.  Abdominal:     Palpations: Abdomen is soft.     Tenderness: There is no abdominal tenderness.  Musculoskeletal:     Right lower leg: No tenderness. No edema.     Left lower leg: No tenderness. No edema.     Comments: Patient does have some tenderness to the more superior aspect of the scapular.  No tenderness to the arm.  Palpable radial pulses.  Compartments are soft.  Skin:    General: Skin is warm and dry.   Neurological:     Mental Status: She is alert.     ED Results / Procedures / Treatments   Labs (all labs ordered are listed, but only abnormal results are displayed) Labs Reviewed  COMPREHENSIVE METABOLIC PANEL - Abnormal; Notable for the following components:      Result Value   Potassium 5.5 (*)    Glucose, Bld 137 (*)    Creatinine, Ser 1.21 (*)    Total Bilirubin 1.6 (*)    GFR, Estimated 42 (*)    All other components within normal limits  CBC WITH DIFFERENTIAL/PLATELET - Abnormal; Notable for the following components:   RBC 5.28 (*)    Hemoglobin 15.7 (*)    HCT 47.4 (*)    Platelets 147 (*)    All other components within normal limits  BASIC METABOLIC PANEL - Abnormal; Notable for the following components:   Potassium 3.1 (*)    Glucose, Bld 134 (*)    Creatinine, Ser 1.16 (*)    GFR, Estimated 45 (*)    All other components within normal limits  I-STAT CHEM 8, ED - Abnormal; Notable for the following components:   Potassium 3.1 (*)    Creatinine, Ser 1.10 (*)    Glucose, Bld 135 (*)    Hemoglobin 15.6 (*)    All other components within normal limits  LIPASE, BLOOD  BRAIN NATRIURETIC PEPTIDE  CBC WITH DIFFERENTIAL/PLATELET  URINALYSIS, ROUTINE W REFLEX MICROSCOPIC  TROPONIN I (HIGH SENSITIVITY)  TROPONIN I (HIGH SENSITIVITY)    EKG None  Radiology CT Angio Chest/Abd/Pel for Dissection W and/or Wo Contrast Result Date: 03/10/2023 CLINICAL DATA:  Acute aortic syndrome (AAS) suspected. Chest pain radiating to right shoulder EXAM: CT ANGIOGRAPHY CHEST, ABDOMEN AND PELVIS TECHNIQUE: Non-contrast CT of the chest was initially obtained. Multidetector CT imaging through the chest, abdomen and pelvis was performed using the standard protocol during bolus administration of intravenous contrast. Multiplanar reconstructed images and MIPs were obtained and reviewed to evaluate the vascular anatomy. RADIATION DOSE REDUCTION: This exam was performed according to the  departmental dose-optimization program which includes automated exposure control, adjustment of the mA and/or kV according to patient size and/or use of iterative reconstruction technique. CONTRAST:  80mL OMNIPAQUE  IOHEXOL  350 MG/ML SOLN COMPARISON:  11/06/2022 FINDINGS: CTA CHEST FINDINGS Cardiovascular: Heart is mildly enlarged. Flow related abnormality is seen in the descending thoracic aorta felt to reflect aortic dissection with collapsed true lumen. No involvement of the great vessels for ascending thoracic aorta. Mild aneurysmal dilatation of the ascending thoracic  aorta measuring 4.1 cm. Mediastinum/Nodes: No mediastinal, hilar, or axillary adenopathy. Trachea and esophagus are unremarkable. Thyroid  unremarkable. Lungs/Pleura: Linear bibasilar atelectasis. No confluent opacities or effusions. No pneumothorax. Musculoskeletal: Chest wall soft tissues are unremarkable. No acute bony abnormality. Review of the MIP images confirms the above findings. CTA ABDOMEN AND PELVIS FINDINGS VASCULAR Aorta: Filling defect within the proximal abdominal aorta felt to reflect continuation of the dissection and the collapsed true lumen. This is not visualized the on the proximal abdominal aorta. Atherosclerotic calcifications in the distal abdominal aorta. No aneurysm. Celiac: Patent without evidence of aneurysm, dissection, vasculitis or significant stenosis. SMA: Patent without evidence of aneurysm, dissection, vasculitis or significant stenosis. Renals: Both renal arteries are patent without evidence of aneurysm, dissection, vasculitis, fibromuscular dysplasia or significant stenosis. IMA: Patent without evidence of aneurysm, dissection, vasculitis or significant stenosis. Inflow: Patent without evidence of aneurysm, dissection, vasculitis or significant stenosis. Moderate atherosclerotic calcifications. Veins: No obvious venous abnormality within the limitations of this arterial phase study. Review of the MIP images  confirms the above findings. NON-VASCULAR Hepatobiliary: No focal hepatic abnormality. Gallbladder unremarkable. Pancreas: No focal abnormality or ductal dilatation. Spleen: No focal abnormality.  Normal size. Adrenals/Urinary Tract: No adrenal abnormality. No focal renal abnormality. No stones or hydronephrosis. Urinary bladder is unremarkable. Stomach/Bowel: Diffuse colonic diverticulosis. No active diverticulitis. Stomach and small bowel decompressed. No bowel obstruction. Lymphatic: No adenopathy Reproductive: Prior hysterectomy.  No adnexal masses. Other: No free fluid or free air. Musculoskeletal: Prior right hip replacement. Degenerative disc and facet disease throughout the lumbar spine. No acute bony abnormality. Review of the MIP images confirms the above findings. 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 Mild aneurysmal dilatation of the ascending thoracic aorta, 4.1 cm. Diffuse colonic diverticulosis.  No active diverticulitis. Critical Value/emergent results were called by telephone at the time of interpretation on 03/10/2023 at 2:46 am to provider Aslan Himes , who verbally acknowledged these results. Electronically Signed   By: Franky Crease M.D.   On: 03/10/2023 02:47   DG Chest 2 View Result Date: 03/09/2023 CLINICAL DATA:  Chest pain EXAM: CHEST - 2 VIEW COMPARISON:  11/05/2022 FINDINGS: Cardiac shadow is enlarged in size. Tortuous thoracic aorta is again noted. The lungs are well aerated bilaterally. No focal infiltrate or effusion is seen. No acute bony abnormality is noted. IMPRESSION: No active cardiopulmonary disease. Electronically Signed   By: Oneil Devonshire M.D.   On: 03/09/2023 22:59    Procedures .Critical Care  Performed by: Bernis Ernst, PA-C Authorized by: Bernis Ernst, PA-C   Critical  care provider statement:    Critical care time (minutes):  60   Critical care was necessary to treat or prevent imminent or life-threatening deterioration of the following conditions: Type B dissection.   Critical care was time spent personally by me on the following activities:  Development of treatment plan with patient or surrogate, discussions with consultants, evaluation of patient's response to treatment, examination of patient, ordering and review of laboratory studies, ordering and review of radiographic studies, ordering and performing treatments and interventions, pulse oximetry, re-evaluation of patient's condition, review of old charts and obtaining history from patient or surrogate   Care discussed with: admitting provider      Medications Ordered in ED Medications  potassium chloride  SA (KLOR-CON  M) CR tablet 40 mEq (has no administration in time range)  nicardipine  (CARDENE ) 20mg  in 0.86% saline 200ml IV infusion (0.1 mg/ml) (has no administration in time range)  esmolol  (BREVIBLOC ) 2000 mg / 100 mL (20 mg/mL) infusion (has no administration in time range)  labetalol  (NORMODYNE ) injection 10 mg (has no administration in time range)  iohexol  (OMNIPAQUE ) 350 MG/ML injection 80 mL (80 mLs Intravenous Contrast Given 03/10/23 0231)    ED Course/ Medical Decision Making/ A&P Clinical Course as of 03/10/23 9386  Wed Mar 10, 2023  0337 Person obtained from patient to inform family on her results and care. Daughter, Kyra, updated on results and plan.  [RR]  [phone] Spokewith vascular surgery. Plan is for medical management to the ICU. They will see them in the morning. Palpable distal pulses. Nursing confirmed as well.  [RR]  [phone] Spoke with Dr. Layman with critical care.  [RR]    Clinical Course User Index [RR] Bernis Ernst, PA-C   Medical Decision Making Amount and/or Complexity of Data Reviewed Labs: ordered. Radiology: ordered.  Risk Prescription drug management. Decision  regarding hospitalization.   89 y.o. female presents to the ER for evaluation of chest pain sine yesterday. Differential diagnosis includes but is not limited to ACS, pericarditis, myocarditis, aortic dissection, PE, pneumothorax, esophageal spasm or rupture, chronic angina, pneumonia, bronchitis, GERD, reflux/PUD, biliary disease, pancreatitis, costochondritis, anxiety. Vital signs elevated BP 156/100 otherwise unremarkable . Physical exam as noted above.   On previous chart evaluation, distal thoracic aorta measuring up to 3.2 cm on Sept 2024.  Most recent cardiology note from January 2024.  Is regarding blood pressure management.  She was diagnosed with resistant hypertension but was noted to have very labile blood pressures.  I independently reviewed and interpreted the patient's labs. Troponin 8. CMP hypokalemia at 3.1, glucose at 134, creatinine 1.16. BNP normal limits. Lipase within normal limits. CBC elevated hemoglobin hematocrit at 15.7 and 47.4 resectability.  White blood cell count within normal limits.  Platelets mildly low at 147, consistent with patient's previous.  CXR shows No active cardiopulmonary disease. .Per radiologist's interpretation.    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 Mild aneurysmal dilatation of the ascending thoracic aorta, 4.1 cm. Diffuse colonic diverticulosis.  No active diverticulitis.   My attending has ordered BP medications for BP control. Vascular surgery consulted.  Awaiting page back.  I spoke with Dr. Serene with vascular surgery.  Recommended blood pressure control and admit to ICU.  Will see the patient tomorrow.  Patient still not having abdominal pain.  Reports that she just does not feel well.  Still has palpable DP, PT, and radial  pulses that feel symmetric.  Abdomen soft and nontender to palpation.  I spoke with Dr. Layman from ICU who will be admitting.  I updated the patient's daughter Kyra with the permission of the patient.  They will be coming to join her soon.  Portions of this report may have been transcribed using voice recognition software. Every effort was made to ensure accuracy; however, inadvertent computerized transcription errors may be present.   Final Clinical Impression(s) / ED Diagnoses Final diagnoses:  Dissection of descending thoracic aorta [facility])    Rx / DC Orders ED Discharge Orders     None         Bernis Ernst, PA-C 03/10/23 9293    Melvenia Motto, MD 03/10/23 [id number]

## 2023-03-09 NOTE — ED Triage Notes (Signed)
 Pt BIB [facility] d/t CP and radiates to right shoulder arm and goes down leg - onset 2 hours while lying 1 hour after eating a sandwich. Pt has some issue states son with esophagus / swallowing.  Seems similar but worse.

## 2023-03-10 ENCOUNTER — Emergency Department (HOSPITAL_COMMUNITY): Payer: 59

## 2023-03-10 DIAGNOSIS — I7121 Aneurysm of the ascending aorta, without rupture: Secondary | ICD-10-CM | POA: Diagnosis present

## 2023-03-10 DIAGNOSIS — Z96641 Presence of right artificial hip joint: Secondary | ICD-10-CM | POA: Diagnosis present

## 2023-03-10 DIAGNOSIS — N1831 Chronic kidney disease, stage 3a: Secondary | ICD-10-CM | POA: Diagnosis present

## 2023-03-10 DIAGNOSIS — K219 Gastro-esophageal reflux disease without esophagitis: Secondary | ICD-10-CM | POA: Diagnosis present

## 2023-03-10 DIAGNOSIS — E785 Hyperlipidemia, unspecified: Secondary | ICD-10-CM | POA: Diagnosis present

## 2023-03-10 DIAGNOSIS — I7100 Dissection of unspecified site of aorta: Secondary | ICD-10-CM | POA: Diagnosis present

## 2023-03-10 DIAGNOSIS — I71012 Dissection of descending thoracic aorta: Secondary | ICD-10-CM

## 2023-03-10 DIAGNOSIS — Z8673 Personal history of transient ischemic attack (TIA), and cerebral infarction without residual deficits: Secondary | ICD-10-CM | POA: Diagnosis not present

## 2023-03-10 DIAGNOSIS — B029 Zoster without complications: Secondary | ICD-10-CM | POA: Diagnosis present

## 2023-03-10 DIAGNOSIS — I169 Hypertensive crisis, unspecified: Secondary | ICD-10-CM | POA: Diagnosis present

## 2023-03-10 DIAGNOSIS — Z803 Family history of malignant neoplasm of breast: Secondary | ICD-10-CM | POA: Diagnosis not present

## 2023-03-10 DIAGNOSIS — E872 Acidosis, unspecified: Secondary | ICD-10-CM | POA: Diagnosis present

## 2023-03-10 DIAGNOSIS — Z79899 Other long term (current) drug therapy: Secondary | ICD-10-CM | POA: Diagnosis not present

## 2023-03-10 DIAGNOSIS — Z66 Do not resuscitate: Secondary | ICD-10-CM | POA: Diagnosis present

## 2023-03-10 DIAGNOSIS — D751 Secondary polycythemia: Secondary | ICD-10-CM | POA: Diagnosis present

## 2023-03-10 DIAGNOSIS — I5022 Chronic systolic (congestive) heart failure: Secondary | ICD-10-CM | POA: Diagnosis present

## 2023-03-10 DIAGNOSIS — F419 Anxiety disorder, unspecified: Secondary | ICD-10-CM | POA: Diagnosis present

## 2023-03-10 DIAGNOSIS — E1165 Type 2 diabetes mellitus with hyperglycemia: Secondary | ICD-10-CM | POA: Diagnosis present

## 2023-03-10 DIAGNOSIS — R54 Age-related physical debility: Secondary | ICD-10-CM | POA: Diagnosis present

## 2023-03-10 DIAGNOSIS — E876 Hypokalemia: Secondary | ICD-10-CM | POA: Diagnosis present

## 2023-03-10 DIAGNOSIS — I161 Hypertensive emergency: Secondary | ICD-10-CM | POA: Diagnosis not present

## 2023-03-10 DIAGNOSIS — E1122 Type 2 diabetes mellitus with diabetic chronic kidney disease: Secondary | ICD-10-CM | POA: Diagnosis present

## 2023-03-10 DIAGNOSIS — I130 Hypertensive heart and chronic kidney disease with heart failure and stage 1 through stage 4 chronic kidney disease, or unspecified chronic kidney disease: Secondary | ICD-10-CM | POA: Diagnosis present

## 2023-03-10 DIAGNOSIS — N179 Acute kidney failure, unspecified: Secondary | ICD-10-CM | POA: Diagnosis not present

## 2023-03-10 DIAGNOSIS — G40909 Epilepsy, unspecified, not intractable, without status epilepticus: Secondary | ICD-10-CM | POA: Diagnosis present

## 2023-03-10 DIAGNOSIS — I71011 Dissection of aortic arch: Secondary | ICD-10-CM | POA: Diagnosis not present

## 2023-03-10 DIAGNOSIS — Z91013 Allergy to seafood: Secondary | ICD-10-CM | POA: Diagnosis not present

## 2023-03-10 LAB — BASIC METABOLIC PANEL
Anion gap: 12 (ref 5–15)
Anion gap: 13 (ref 5–15)
Anion gap: 8 (ref 5–15)
BUN: 11 mg/dL (ref 8–23)
BUN: 12 mg/dL (ref 8–23)
BUN: 11 mg/dL (ref 8–23)
CO2: 24 mmol/L (ref 22–32)
CO2: 23 mmol/L (ref 22–32)
CO2: 22 mmol/L (ref 22–32)
Calcium: 10 mg/dL (ref 8.9–10.3)
Calcium: 9.9 mg/dL (ref 8.9–10.3)
Calcium: 9.4 mg/dL (ref 8.9–10.3)
Chloride: 103 mmol/L (ref 98–111)
Chloride: 101 mmol/L (ref 98–111)
Chloride: 106 mmol/L (ref 98–111)
Creatinine, Ser: 1.16 mg/dL — ABNORMAL HIGH (ref 0.44–1.00)
Creatinine, Ser: 1.31 mg/dL — ABNORMAL HIGH (ref 0.44–1.00)
Creatinine, Ser: 1.2 mg/dL — ABNORMAL HIGH (ref 0.44–1.00)
GFR, Estimated: 45 mL/min — ABNORMAL LOW (ref >60)
GFR, Estimated: 38 mL/min — ABNORMAL LOW (ref >60)
GFR, Estimated: 43 mL/min — ABNORMAL LOW (ref >60)
Glucose, Bld: 134 mg/dL — ABNORMAL HIGH (ref 70–99)
Glucose, Bld: 180 mg/dL — ABNORMAL HIGH (ref 70–99)
Glucose, Bld: 182 mg/dL — ABNORMAL HIGH (ref 70–99)
Potassium: 3.1 mmol/L — ABNORMAL LOW (ref 3.5–5.1)
Potassium: 3.5 mmol/L (ref 3.5–5.1)
Potassium: 4.2 mmol/L (ref 3.5–5.1)
Sodium: 139 mmol/L (ref 135–145)
Sodium: 137 mmol/L (ref 135–145)
Sodium: 136 mmol/L (ref 135–145)

## 2023-03-10 LAB — URINALYSIS, ROUTINE W REFLEX MICROSCOPIC
Bilirubin Urine: NEGATIVE
Glucose, UA: NEGATIVE mg/dL
Hgb urine dipstick: NEGATIVE
Ketones, ur: NEGATIVE mg/dL
Leukocytes,Ua: NEGATIVE
Nitrite: NEGATIVE
Protein, ur: NEGATIVE mg/dL
Specific Gravity, Urine: 1.021 (ref 1.005–1.030)
pH: 8 (ref 5.0–8.0)

## 2023-03-10 LAB — COMPREHENSIVE METABOLIC PANEL
ALT: 12 U/L (ref 0–44)
AST: 33 U/L (ref 15–41)
Albumin: 3.7 g/dL (ref 3.5–5.0)
Alkaline Phosphatase: 63 U/L (ref 38–126)
Anion gap: 10 (ref 5–15)
BUN: 10 mg/dL (ref 8–23)
CO2: 25 mmol/L (ref 22–32)
Calcium: 10.2 mg/dL (ref 8.9–10.3)
Chloride: 103 mmol/L (ref 98–111)
Creatinine, Ser: 1.21 mg/dL — ABNORMAL HIGH (ref 0.44–1.00)
GFR, Estimated: 42 mL/min — ABNORMAL LOW (ref >60)
Glucose, Bld: 137 mg/dL — ABNORMAL HIGH (ref 70–99)
Potassium: 5.5 mmol/L — ABNORMAL HIGH (ref 3.5–5.1)
Sodium: 138 mmol/L (ref 135–145)
Total Bilirubin: 1.6 mg/dL — ABNORMAL HIGH (ref 0.0–1.2)
Total Protein: 6.9 g/dL (ref 6.5–8.1)

## 2023-03-10 LAB — CBC
HCT: 46.3 % — ABNORMAL HIGH (ref 36.0–46.0)
Hemoglobin: 15.2 g/dL — ABNORMAL HIGH (ref 12.0–15.0)
MCH: 29.4 pg (ref 26.0–34.0)
MCHC: 32.8 g/dL (ref 30.0–36.0)
MCV: 89.6 fL (ref 80.0–100.0)
Platelets: 160 10*3/uL (ref 150–400)
RBC: 5.17 MIL/uL — ABNORMAL HIGH (ref 3.87–5.11)
RDW: 12.1 % (ref 11.5–15.5)
WBC: 5 10*3/uL (ref 4.0–10.5)
nRBC: 0 % (ref 0.0–0.2)

## 2023-03-10 LAB — CBC WITH DIFFERENTIAL/PLATELET
Abs Immature Granulocytes: 0.02 10*3/uL (ref 0.00–0.07)
Basophils Absolute: 0 10*3/uL (ref 0.0–0.1)
Basophils Relative: 1 %
Eosinophils Absolute: 0.1 10*3/uL (ref 0.0–0.5)
Eosinophils Relative: 2 %
HCT: 47.4 % — ABNORMAL HIGH (ref 36.0–46.0)
Hemoglobin: 15.7 g/dL — ABNORMAL HIGH (ref 12.0–15.0)
Immature Granulocytes: 0 %
Lymphocytes Relative: 21 %
Lymphs Abs: 1 10*3/uL (ref 0.7–4.0)
MCH: 29.7 pg (ref 26.0–34.0)
MCHC: 33.1 g/dL (ref 30.0–36.0)
MCV: 89.8 fL (ref 80.0–100.0)
Monocytes Absolute: 0.7 10*3/uL (ref 0.1–1.0)
Monocytes Relative: 14 %
Neutro Abs: 3 10*3/uL (ref 1.7–7.7)
Neutrophils Relative %: 62 %
Platelets: 147 10*3/uL — ABNORMAL LOW (ref 150–400)
RBC: 5.28 MIL/uL — ABNORMAL HIGH (ref 3.87–5.11)
RDW: 11.9 % (ref 11.5–15.5)
WBC: 4.8 10*3/uL (ref 4.0–10.5)
nRBC: 0 % (ref 0.0–0.2)

## 2023-03-10 LAB — BRAIN NATRIURETIC PEPTIDE: B Natriuretic Peptide: 48.7 pg/mL (ref 0.0–100.0)

## 2023-03-10 LAB — TYPE AND SCREEN
ABO/RH(D): A POS
Antibody Screen: NEGATIVE

## 2023-03-10 LAB — I-STAT CHEM 8, ED
BUN: 10 mg/dL (ref 8–23)
Calcium, Ion: 1.17 mmol/L (ref 1.15–1.40)
Chloride: 104 mmol/L (ref 98–111)
Creatinine, Ser: 1.1 mg/dL — ABNORMAL HIGH (ref 0.44–1.00)
Glucose, Bld: 135 mg/dL — ABNORMAL HIGH (ref 70–99)
HCT: 46 % (ref 36.0–46.0)
Hemoglobin: 15.6 g/dL — ABNORMAL HIGH (ref 12.0–15.0)
Potassium: 3.1 mmol/L — ABNORMAL LOW (ref 3.5–5.1)
Sodium: 139 mmol/L (ref 135–145)
TCO2: 24 mmol/L (ref 22–32)

## 2023-03-10 LAB — TROPONIN I (HIGH SENSITIVITY)
Troponin I (High Sensitivity): 7 ng/L (ref <18)
Troponin I (High Sensitivity): 7 ng/L (ref <18)
Troponin I (High Sensitivity): 8 ng/L (ref <18)
Troponin I (High Sensitivity): 8 ng/L (ref <18)

## 2023-03-10 LAB — GLUCOSE, CAPILLARY
Glucose-Capillary: 172 mg/dL — ABNORMAL HIGH (ref 70–99)
Glucose-Capillary: 190 mg/dL — ABNORMAL HIGH (ref 70–99)
Glucose-Capillary: 143 mg/dL — ABNORMAL HIGH (ref 70–99)
Glucose-Capillary: 104 mg/dL — ABNORMAL HIGH (ref 70–99)
Glucose-Capillary: 169 mg/dL — ABNORMAL HIGH (ref 70–99)

## 2023-03-10 LAB — LIPASE, BLOOD: Lipase: 31 U/L (ref 11–51)

## 2023-03-10 LAB — MAGNESIUM: Magnesium: 1.8 mg/dL (ref 1.7–2.4)

## 2023-03-10 LAB — MRSA NEXT GEN BY PCR, NASAL: MRSA by PCR Next Gen: NOT DETECTED

## 2023-03-10 MED ORDER — AMLODIPINE BESYLATE 10 MG PO TABS
10.0000 mg | ORAL_TABLET | Freq: Every day | ORAL | Status: DC
  Administered 2023-03-10 – 2023-03-14 (×5): 10 mg via ORAL
  Filled 2023-03-10 (×5): qty 1

## 2023-03-10 MED ORDER — SODIUM CHLORIDE 0.9 % IV SOLN
250.0000 mg | Freq: Two times a day (BID) | INTRAVENOUS | Status: DC
  Administered 2023-03-10 – 2023-03-11 (×4): 250 mg via INTRAVENOUS
  Filled 2023-03-10 (×7): qty 2.5

## 2023-03-10 MED ORDER — LABETALOL HCL 5 MG/ML IV SOLN
10.0000 mg | Freq: Once | INTRAVENOUS | Status: AC
  Administered 2023-03-10: 10 mg via INTRAVENOUS

## 2023-03-10 MED ORDER — NICARDIPINE HCL IN NACL 20-0.86 MG/200ML-% IV SOLN
3.0000 mg/h | INTRAVENOUS | Status: DC
  Administered 2023-03-10 (×2): 10 mg/h via INTRAVENOUS
  Administered 2023-03-10: 5 mg/h via INTRAVENOUS
  Administered 2023-03-10: 10 mg/h via INTRAVENOUS
  Administered 2023-03-11: 3 mg/h via INTRAVENOUS
  Administered 2023-03-11: 5 mg/h via INTRAVENOUS
  Administered 2023-03-11 (×2): 3 mg/h via INTRAVENOUS
  Administered 2023-03-12 (×2): 5 mg/h via INTRAVENOUS
  Administered 2023-03-12: 7.5 mg/h via INTRAVENOUS
  Administered 2023-03-12: 5 mg/h via INTRAVENOUS
  Filled 2023-03-10 (×5): qty 200
  Filled 2023-03-10: qty 400
  Filled 2023-03-10 (×2): qty 200

## 2023-03-10 MED ORDER — POTASSIUM CHLORIDE 10 MEQ/100ML IV SOLN
10.0000 meq | INTRAVENOUS | Status: DC
  Administered 2023-03-10 (×2): 10 meq via INTRAVENOUS
  Filled 2023-03-10 (×4): qty 100

## 2023-03-10 MED ORDER — INSULIN ASPART 100 UNIT/ML IJ SOLN
0.0000 [IU] | INTRAMUSCULAR | Status: DC
  Administered 2023-03-10 (×2): 2 [IU] via SUBCUTANEOUS
  Administered 2023-03-10: 1 [IU] via SUBCUTANEOUS
  Administered 2023-03-11: 2 [IU] via SUBCUTANEOUS
  Administered 2023-03-11: 3 [IU] via SUBCUTANEOUS
  Administered 2023-03-11 – 2023-03-12 (×6): 1 [IU] via SUBCUTANEOUS
  Administered 2023-03-12: 2 [IU] via SUBCUTANEOUS
  Administered 2023-03-13 – 2023-03-14 (×2): 1 [IU] via SUBCUTANEOUS

## 2023-03-10 MED ORDER — PANTOPRAZOLE SODIUM 20 MG PO TBEC
20.0000 mg | DELAYED_RELEASE_TABLET | Freq: Every day | ORAL | Status: DC
  Filled 2023-03-10: qty 1

## 2023-03-10 MED ORDER — VALACYCLOVIR HCL 500 MG PO TABS
1000.0000 mg | ORAL_TABLET | Freq: Every day | ORAL | Status: DC
  Administered 2023-03-10 – 2023-03-14 (×5): 1000 mg via ORAL
  Filled 2023-03-10 (×5): qty 2

## 2023-03-10 MED ORDER — ORAL CARE MOUTH RINSE: 15.0000 mL | OROMUCOSAL | Status: DC | PRN

## 2023-03-10 MED ORDER — IOHEXOL 350 MG/ML SOLN
80.0000 mL | Freq: Once | INTRAVENOUS | Status: AC | PRN
  Administered 2023-03-10: 80 mL via INTRAVENOUS

## 2023-03-10 MED ORDER — POTASSIUM CHLORIDE CRYS ER 20 MEQ PO TBCR: 40.0000 meq | EXTENDED_RELEASE_TABLET | Freq: Once | ORAL | Status: DC

## 2023-03-10 MED ORDER — GABAPENTIN 100 MG PO CAPS
100.0000 mg | ORAL_CAPSULE | Freq: Three times a day (TID) | ORAL | Status: DC
  Administered 2023-03-10 – 2023-03-14 (×13): 100 mg via ORAL
  Filled 2023-03-10 (×13): qty 1

## 2023-03-10 MED ORDER — POLYETHYLENE GLYCOL 3350 17 G PO PACK
17.0000 g | PACK | Freq: Every day | ORAL | Status: DC | PRN
Start: 2023-03-10 — End: 2023-03-14

## 2023-03-10 MED ORDER — TRAMADOL HCL 50 MG PO TABS
50.0000 mg | ORAL_TABLET | Freq: Two times a day (BID) | ORAL | Status: DC | PRN
  Administered 2023-03-10 – 2023-03-11 (×2): 50 mg via ORAL
  Filled 2023-03-10 (×4): qty 1

## 2023-03-10 MED ORDER — FENTANYL CITRATE PF 50 MCG/ML IJ SOSY
25.0000 ug | PREFILLED_SYRINGE | INTRAMUSCULAR | Status: DC | PRN
  Administered 2023-03-10 – 2023-03-11 (×4): 25 ug via INTRAVENOUS
  Filled 2023-03-10 (×4): qty 1

## 2023-03-10 MED ORDER — SODIUM CHLORIDE 0.9 % IV SOLN: INTRAVENOUS | Status: AC | PRN

## 2023-03-10 MED ORDER — DOCUSATE SODIUM 100 MG PO CAPS: 100.0000 mg | ORAL_CAPSULE | Freq: Two times a day (BID) | ORAL | Status: DC | PRN

## 2023-03-10 MED ORDER — PANTOPRAZOLE SODIUM 40 MG PO TBEC
40.0000 mg | DELAYED_RELEASE_TABLET | Freq: Every day | ORAL | Status: DC
  Administered 2023-03-10 – 2023-03-14 (×5): 40 mg via ORAL
  Filled 2023-03-10 (×5): qty 1

## 2023-03-10 MED ORDER — ESMOLOL HCL-SODIUM CHLORIDE 2000 MG/100ML IV SOLN
25.0000 ug/kg/min | INTRAVENOUS | Status: DC
  Administered 2023-03-10: 25 ug/kg/min via INTRAVENOUS
  Administered 2023-03-10: 150 ug/kg/min via INTRAVENOUS
  Administered 2023-03-10: 50 ug/kg/min via INTRAVENOUS
  Administered 2023-03-11 (×2): 100 ug/kg/min via INTRAVENOUS
  Administered 2023-03-11: 150 ug/kg/min via INTRAVENOUS
  Administered 2023-03-11 – 2023-03-12 (×2): 100 ug/kg/min via INTRAVENOUS
  Administered 2023-03-12: 75 ug/kg/min via INTRAVENOUS
  Administered 2023-03-12: 175 ug/kg/min via INTRAVENOUS
  Administered 2023-03-12: 100 ug/kg/min via INTRAVENOUS
  Administered 2023-03-12: 125 ug/kg/min via INTRAVENOUS
  Filled 2023-03-10 (×11): qty 100
  Filled 2023-03-10: qty 200
  Filled 2023-03-10: qty 100

## 2023-03-10 MED ORDER — POTASSIUM CHLORIDE CRYS ER 20 MEQ PO TBCR
20.0000 meq | EXTENDED_RELEASE_TABLET | Freq: Once | ORAL | Status: AC
  Administered 2023-03-10: 20 meq via ORAL
  Filled 2023-03-10: qty 1

## 2023-03-10 MED ORDER — CHLORHEXIDINE GLUCONATE CLOTH 2 % EX PADS
6.0000 | MEDICATED_PAD | Freq: Every day | CUTANEOUS | Status: DC
  Administered 2023-03-10 – 2023-03-14 (×4): 6 via TOPICAL

## 2023-03-10 NOTE — H&P (Signed)
 NAME:  Amber Bolton, MRN:  [id number], DOB:  12/02/1929, LOS: 0 ADMISSION DATE:  03/09/2023, CONSULTATION DATE:  03/10/23 REFERRING MD:  EDP, CHIEF COMPLAINT:  type B Aortic dissection   History of Present Illness:  89 yo female presented with worsening sharp pain at R shoulder blade that intermittently goes all the way down her back, pain started approximately 3-4 days ago but has worsened. Pt states she had been trying to utilize different creams to area and heating pad without relief. She denies n/v/d, cp/dizziness. Denies numbness or tingling other than in her hands intermittently but she states this is not new. Otherwise she has been in her normal state of health.   BP on presentation was 180's systolic, HR in 80's. 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 She was started on esmolol  and cardene  infusions. Vascular surgery has been notified and will see in am and has requested ccm admit pt to ICU for monitoring.   Pt endorses improvement in her pain since presentation.   Pertinent  Medical History  Anxiety Gerd Hyperlipidemia [id number]  Significant Hospital Events: Including procedures, antibiotic start and stop dates in addition to other pertinent events   Admitted to ICU 1/8  Interim History / Subjective:    Objective   Blood pressure (!) 146/72, pulse 64, temperature 99.8 F (37.7 C), temperature source Rectal, resp. rate 15, height 4' 11 (1.499 m), weight 57.9 kg, SpO2 99%.       No intake or output data in the 24 hours ending 03/10/23 0445 Filed Weights   03/09/23 2230  Weight: 57.9 kg    Examination: General: nad, reclining comfortably in bed HENT: ncat, eomi, perrla, mmmp Lungs: ctab Cardiovascular: rrr Abdomen: soft, nt,nd bs+ Extremities: no c/c/e Neuro: alert and oriented x4, no focal deficits GU: deferred.   Resolved Hospital Problem list     Assessment & Plan:  Acute type B aortic dissection Aortic aneurysm  4.1cm Ckd3a Hypokalemia -impulse control with hr and SBP control -HR goal <60 SBP goal <110 -vascular to see in am    Best Practice (right click and Reselect all SmartList Selections daily)   Diet/type: NPO w/ oral meds DVT prophylaxis SCD Pressure ulcer(s): N/A GI prophylaxis: PPI Lines: N/A Foley:  N/A Code Status:  DNR Last date of multidisciplinary goals of care discussion [discussed with pt, daughter and grand daughter 03/10/23]  Labs   CBC: Recent Labs  Lab 03/10/23 0016 03/10/23 0045  WBC  --  4.8  NEUTROABS  --  3.0  HGB 15.6* 15.7*  HCT 46.0 47.4*  MCV  --  89.8  PLT  --  147*    Basic Metabolic Panel: Recent Labs  Lab 03/09/23 2230 03/10/23 0016 03/10/23 0152 03/10/23 0218  NA 138 139 139  --   K 5.5* 3.1* 3.1*  --   CL 103 104 103  --   CO2 25  --  24  --   GLUCOSE 137* 135* 134*  --   BUN 10 10 11   --   CREATININE 1.21* 1.10* 1.16*  --   CALCIUM  10.2  --  10.0  --   MG  --   --   --  1.8   GFR: Estimated Creatinine Clearance: 24.5 mL/min (A) (by C-G formula based on SCr of 1.16 mg/dL (H)). Recent Labs  Lab 03/10/23 0045  WBC 4.8    Liver Function Tests: Recent Labs  Lab 03/09/23 2230  AST 33  ALT 12  ALKPHOS 63  BILITOT 1.6*  PROT 6.9  ALBUMIN  3.7   Recent Labs  Lab 03/09/23 2230  LIPASE 31   No results for input(s): AMMONIA in the last 168 hours.  ABG    Component Value Date/Time   TCO2 24 03/10/2023 0016     Coagulation Profile: No results for input(s): INR, PROTIME in the last 168 hours.  Cardiac Enzymes: No results for input(s): CKTOTAL, CKMB, CKMBINDEX, TROPONINI in the last 168 hours.  HbA1C: Hgb A1c MFr Bld  Date/Time Value Ref Range Status  06/17/2022 02:27 AM 7.3 (H) 4.8 - 5.6 % Final    Comment:    (NOTE) Pre diabetes:          5.7%-6.4%  Diabetes:              >6.4%  Glycemic control for   <7.0% adults with diabetes     CBG: No results for input(s): GLUCAP in the last 168  hours.  Review of Systems:   As per HPI  Past Medical History:  She,  has a past medical history of Arthritis, Bilateral subdural hematomas (HCC), Diabetes (HCC), HFrEF (heart failure with reduced ejection fraction) (HCC), Hypercalcemia, Hyperlipemia, Resistant hypertension (03/09/2022), Sleep apnea, and TIA (transient ischemic attack).   Surgical History:   Past Surgical History:  Procedure Laterality Date   ABDOMINAL HYSTERECTOMY  1978   HEMORRHOID SURGERY  1960'S   TOTAL HIP ARTHROPLASTY Right 08/23/2012   Procedure: RIGHT TOTAL HIP ARTHROPLASTY ANTERIOR APPROACH;  Surgeon: Donnice JONETTA Car, MD;  Location: [facility];  Service: Orthopedics;  Laterality: Right;     Social History:   reports that she has never smoked. She has never used smokeless tobacco. She reports that she does not drink alcohol and does not use drugs.   Family History:  Her family history includes Breast cancer in her mother.   Allergies Allergies  Allergen Reactions   Penicillins Rash   Shellfish Allergy Itching    Mouth itches     Home Medications  Prior to Admission medications   Medication Sig Start Date End Date Taking? Authorizing Provider  amLODipine  (NORVASC ) 10 MG tablet Take 10 mg by mouth daily.   Yes [provider]  feeding supplement (ENSURE ENLIVE / ENSURE PLUS) LIQD Take 237 mLs by mouth 3 (three) times daily between meals. Patient taking differently: Take 237 mLs by mouth daily as needed (for meal supplement). 11/09/22  Yes Alexander-Savino, [place], MD  levETIRAcetam  (KEPPRA ) 250 MG tablet Take 1 tablet (250 mg total) by mouth 2 (two) times daily. 06/18/22  Yes Judithe Rocky BROCKS, NP  pantoprazole  (PROTONIX ) 40 MG tablet Take 1 tablet (40 mg total) by mouth daily. 11/09/22 11/09/23 Yes Alexander-Savino, [place], MD  rosuvastatin  (CRESTOR ) 10 MG tablet Take 1 tablet (10 mg total) by mouth daily. 06/19/22  Yes Judithe Rocky BROCKS, NP  traMADol  (ULTRAM ) 50 MG tablet Take 50 mg by mouth 2 (two) times  daily as needed for moderate pain or severe pain.   Yes [provider]  triamcinolone cream (KENALOG) 0.1 % Apply 1 Application topically daily as needed (rash).   Yes [provider]  Vitamin D, Ergocalciferol, (DRISDOL) 1.25 MG (50000 UNIT) CAPS capsule Take 50,000 Units by mouth once a week. Patient not taking: Reported on 03/10/2023    [provider]     Critical care time: 

## 2023-03-10 NOTE — Consult Note (Signed)
 [facility] [place]  Patient name: Amber Bolton MRN: [id number] DOB: Apr 02, 1929 Sex: female   REQUESTING PROVIDER:    ER   REASON FOR CONSULT:    Dissection  HISTORY OF PRESENT ILLNESS:   Amber Bolton is a 89 y.o. female, who presented to the emergency department on 03/10/2023 with complaints of right shoulder pain that goes all the way down her back that has been present for 3 to 4 days.  She was hypertensive on arrival.  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  She denied any abdominal pain or lower extremity discomfort.  She was started on blood pressure medications and transferred to the ICU.  The patient has a history of heart failure.  She is a diabetic.  She is a non-smoker.  PAST MEDICAL HISTORY    Past Medical History:  Diagnosis Date   Arthritis    IN RIGHT HIP AND FINGERS AND BACK   Bilateral subdural hematomas (HCC)    Diabetes (HCC)    HFrEF (heart failure with reduced ejection fraction) (HCC)    Hypercalcemia    Hyperlipemia    Resistant hypertension 03/09/2022   Sleep apnea    STATES SHE HAS CPAP - BUT HAS NOT USED IN THE LAST 2 YRS   TIA (transient ischemic attack)      FAMILY HISTORY   Family History  Problem Relation Age of Onset   Breast cancer Mother     SOCIAL HISTORY:   Social History   Socioeconomic History   Marital status: Married    Spouse name: Not on file   Number of children: Not on file   Years of education: Not on file   Highest education level: Not on file  Occupational History   Not on file  Tobacco Use   Smoking status: Never   Smokeless tobacco: Never  Vaping Use   Vaping status: Never Used  Substance and Sexual Activity   Alcohol use: No   Drug use: No   Sexual activity: Not Currently  Other Topics Concern   Not on file  Social History Narrative   Not on file   Social Drivers of  Health   Financial Resource Strain: Not on file  Food Insecurity: No Food Insecurity (11/06/2022)   Hunger Vital Sign    Worried About Running Out of Food in the Last Year: Never true    Ran Out of Food in the Last Year: Never true  Transportation Needs: No Transportation Needs (11/06/2022)   PRAPARE - Administrator, Civil Service (Medical): No    Lack of Transportation (Non-Medical): No  Physical Activity: Not on file  Stress: Not on file  Social Connections: Not on file  Intimate Partner Violence: Not At Risk (11/06/2022)   Humiliation, Afraid, Rape, and Kick questionnaire    Fear of Current or Ex-Partner: No    Emotionally Abused: No    Physically Abused: No    Sexually Abused: No    ALLERGIES:    Allergies  Allergen Reactions   Penicillins Rash   Shellfish Allergy Itching    Mouth itches    CURRENT MEDICATIONS:    Current Facility-Administered Medications  Medication Dose Route Frequency Provider Last Rate Last Admin   0.9 %  sodium chloride  infusion   Intravenous PRN Layman Raisin, DO       amLODipine  (NORVASC ) tablet 10 mg  10 mg Oral  Daily Gretta Doffing P, DO       Chlorhexidine  Gluconate Cloth 2 % PADS 6 each  6 each Topical Daily Layman Raisin, DO       docusate sodium  (COLACE) capsule 100 mg  100 mg Oral BID PRN Layman Raisin, DO       esmolol  (BREVIBLOC ) 2000 mg / 100 mL (20 mg/mL) infusion  25-300 mcg/kg/min Intravenous Continuous Melvenia Motto, MD 21.7 mL/hr at 03/10/23 0824 125 mcg/kg/min at 03/10/23 0824   fentaNYL  (SUBLIMAZE ) injection 25 mcg  25 mcg Intravenous Q2H PRN Gretta Doffing SQUIBB, DO   25 mcg at 03/10/23 9273   gabapentin  (NEURONTIN ) capsule 100 mg  100 mg Oral TID Gretta Doffing SQUIBB, DO       insulin  aspart (novoLOG ) injection 0-9 Units  0-9 Units Subcutaneous Q4H Gretta Doffing P, DO       levETIRAcetam  (KEPPRA ) 250 mg in sodium chloride  0.9 % 100 mL IVPB  250 mg Intravenous Q12H Gretta Doffing P, DO       nicardipine  (CARDENE ) 20mg  in 0.86%  saline IV infusion (0.1 mg/ml)  3-15 mg/hr Intravenous Continuous Melvenia Motto, MD 125 mL/hr at 03/10/23 0824 12.5 mg/hr at 03/10/23 9175   pantoprazole  (PROTONIX ) EC tablet 20 mg  20 mg Oral Daily Layman Raisin, DO       pantoprazole  (PROTONIX ) EC tablet 40 mg  40 mg Oral Daily Gretta Doffing P, DO       polyethylene glycol (MIRALAX  / GLYCOLAX ) packet 17 g  17 g Oral Daily PRN Layman Raisin, DO       potassium chloride  10 mEq in 100 mL IVPB  10 mEq Intravenous Q1 Hr x 5 Bernis Ernst, [place]   Stopped at 03/10/23 9182   traMADol  (ULTRAM ) tablet 50 mg  50 mg Oral Q12H PRN Gretta Doffing SQUIBB, DO   50 mg at 03/10/23 9273   valACYclovir  (VALTREX ) tablet 1,000 mg  1,000 mg Oral Daily Gretta Doffing P, DO        REVIEW OF SYSTEMS:   [X]  denotes positive finding, [ ]  denotes negative finding Cardiac  Comments:  Chest pain or chest pressure:    Shortness of breath upon exertion:    Short of breath when lying flat:    Irregular heart rhythm:        Vascular    Pain in calf, thigh, or hip brought on by ambulation:    Pain in feet at night that wakes you up from your sleep:     Blood clot in your veins:    Leg swelling:         Pulmonary    Oxygen at home:    Productive cough:     Wheezing:         Neurologic    Sudden weakness in arms or legs:     Sudden numbness in arms or legs:     Sudden onset of difficulty speaking or slurred speech:    Temporary loss of vision in one eye:     Problems with dizziness:         Gastrointestinal    Blood in stool:      Vomited blood:         Genitourinary    Burning when urinating:     Blood in urine:        Psychiatric    Major depression:         Hematologic    Bleeding problems:    Problems with blood clotting  too easily:        Skin    Rashes or ulcers:        Constitutional    Fever or chills:     PHYSICAL EXAM:   Vitals:   03/10/23 0730 03/10/23 0745 03/10/23 0800 03/10/23 0815  BP:  101/75 115/61 (!) 111/59  Pulse: 61  65 (!) 57 (!) 55  Resp: 15 19 14 17   Temp:      TempSrc:      SpO2: 97% 91% (!) 89% 97%  Weight:      Height:        GENERAL: The patient is a well-nourished female, in no acute distress. The vital signs are documented above. CARDIAC: There is a regular rate and rhythm.  VASCULAR: Brisk pedal Doppler signals.  Palpable femoral pulses PULMONARY: Nonlabored respirations ABDOMEN: Soft and non-tender  MUSCULOSKELETAL: There are no major deformities or cyanosis. NEUROLOGIC: No focal weakness or paresthesias are detected. SKIN: There are no ulcers or rashes noted. PSYCHIATRIC: The patient has a normal affect.  STUDIES:   I have reviewed her CT scan with the following findings: Flow related abnormality seen in the proximal descending thoracic aorta continuing through the descending thoracic aorta and into the proximal abdominal aorta most compatible with type B dissection with collapsed true lumen.   Mild aneurysmal dilatation of the ascending thoracic aorta, 4.1 cm.   Diffuse colonic diverticulosis.  No active diverticulitis.  ASSESSMENT and PLAN   Aortic dissection: Fortunately, the patient does not have any signs of malperfusion.  She does not have any abdominal pain or lower extremity complaints.  Her creatinine is normal.  She was admitted to the ICU for blood pressure control with a systolic goal of less than 130.  She appears very frail and advanced in age and so I am hopeful that she does not require surgical intervention.  She would likely need repeat imaging in 48 hours with CT angiogram dissection protocol  On examination in the ICU, the patient was found to have a rash consistent with shingles, for which she is now being treated.  This most likely explains her pain.   Malvina Serene CLORE, MD, FACS [facility] [phone] Pager [phone]

## 2023-03-10 NOTE — Progress Notes (Signed)
 eLink Physician-Brief Progress Note Patient Name: Amber Bolton DOB: 09/11/1929 MRN: [id number]   Date of Service  03/10/2023  HPI/Events of Note  89 yo female presented with worsening sharp pain at R shoulder blade that intermittently goes all the way down her back noted to have a type B aortic dissection.  Vital signs essentially within normal limits.  Now on esmolol  and nicardipine  infusions.  Results consistent with mild electrolyte disturbances and elevated creatinine.  Polycythemia present.  CT angiography shows collapse of the true lumen-contrast can be traced to the celiac trunk, bilateral renals, and iliacs.  eICU Interventions  Tight blood pressure control with goal less than SBP 110.  Tight heart rate control with heart rate less than 60.  Esmolol  and nicardipine  as ordered.  Vascular surgery consultation  DVT prophylaxis held in the setting of potential bleed GI prophylaxis with home pantoprazole      Intervention Category Evaluation Type: New Patient Evaluation  Jayland Null 03/10/2023, 6:31 AM

## 2023-03-10 NOTE — Progress Notes (Signed)
 Notified of severe CP.  At bedside daughter and patient describe her CP as squeezing and right sided. This is more severe that her usual hip pain.   Recent hydrochlorothiazide , lisinopril  stopped, still on amlodipine . With PCP had controlled BP. No recent quinolones.  Fentanyl  25mcg relieved pain.  BP (!) 111/59   Pulse 60   Temp 98.1 F (36.7 C) (Oral)   Resp 12   Ht 4' 11 (1.499 m)   Wt 49.4 kg   SpO2 96%   BMI 22.00 kg/m  Breathing comfortably on [place] S1S2, RRR Blistering erythematous rash from sternum laterally on her chest in a line- when turned rash extends to her spine. Some rash beneath R breast.  EKG: SB, TWI AVL> chronic  Assessment & plan: Type B dissection-- not sure this fully explains her R sided pain at this moment.  -keep HR <60, SBP<120> at goal -restart PTA norvasc  -restarted PTA tramadol  for pain, fentanyl  for breakthrough -NPO until vascular sees her  Shingles -Placed on airborne & contact, no pregnant caretakers if possible. Staff notified. Family notified she is infectious and no one unvaccinated for chicken pox should be around her.  -Valtrex  -start gabapentin  100mg  TID  H/o seizures -PTA keppra  resumed  Hyperglycemia -SSI PRN -goal BG <180 -check A1c  Additional CC time: 25 min.  Leita SHAUNNA Gaskins, DO 03/10/23 7:52 AM [facility]  For contact information, see Amion. If no response to pager, please call PCCM consult pager. After hours, 7PM- 7AM, please call Elink.

## 2023-03-11 DIAGNOSIS — I161 Hypertensive emergency: Secondary | ICD-10-CM

## 2023-03-11 DIAGNOSIS — B029 Zoster without complications: Secondary | ICD-10-CM

## 2023-03-11 DIAGNOSIS — I71012 Dissection of descending thoracic aorta: Secondary | ICD-10-CM | POA: Diagnosis not present

## 2023-03-11 LAB — LACTIC ACID, PLASMA
Lactic Acid, Venous: 2.2 mmol/L (ref 0.5–1.9)
Lactic Acid, Venous: 3.1 mmol/L (ref 0.5–1.9)
Lactic Acid, Venous: 2.7 mmol/L (ref 0.5–1.9)

## 2023-03-11 LAB — HEMOGLOBIN A1C
Hgb A1c MFr Bld: 7.7 % — ABNORMAL HIGH (ref 4.8–5.6)
Mean Plasma Glucose: 174 mg/dL

## 2023-03-11 LAB — BASIC METABOLIC PANEL
Anion gap: 10 (ref 5–15)
BUN: 13 mg/dL (ref 8–23)
CO2: 17 mmol/L — ABNORMAL LOW (ref 22–32)
Calcium: 9.4 mg/dL (ref 8.9–10.3)
Chloride: 108 mmol/L (ref 98–111)
Creatinine, Ser: 1.23 mg/dL — ABNORMAL HIGH (ref 0.44–1.00)
GFR, Estimated: 41 mL/min — ABNORMAL LOW (ref >60)
Glucose, Bld: 180 mg/dL — ABNORMAL HIGH (ref 70–99)
Potassium: 4.9 mmol/L (ref 3.5–5.1)
Sodium: 135 mmol/L (ref 135–145)

## 2023-03-11 LAB — GLUCOSE, CAPILLARY
Glucose-Capillary: 131 mg/dL — ABNORMAL HIGH (ref 70–99)
Glucose-Capillary: 175 mg/dL — ABNORMAL HIGH (ref 70–99)
Glucose-Capillary: 198 mg/dL — ABNORMAL HIGH (ref 70–99)
Glucose-Capillary: 129 mg/dL — ABNORMAL HIGH (ref 70–99)
Glucose-Capillary: 99 mg/dL (ref 70–99)
Glucose-Capillary: 221 mg/dL — ABNORMAL HIGH (ref 70–99)
Glucose-Capillary: 148 mg/dL — ABNORMAL HIGH (ref 70–99)

## 2023-03-11 MED ORDER — VANCOMYCIN HCL IN DEXTROSE 1-5 GM/200ML-% IV SOLN
1000.0000 mg | Freq: Once | INTRAVENOUS | Status: AC
  Administered 2023-03-11: 1000 mg via INTRAVENOUS
  Filled 2023-03-11: qty 200

## 2023-03-11 MED ORDER — LACTATED RINGERS IV BOLUS
1000.0000 mL | Freq: Once | INTRAVENOUS | Status: AC
  Administered 2023-03-11: 1000 mL via INTRAVENOUS

## 2023-03-11 MED ORDER — HYDRALAZINE HCL 10 MG PO TABS
10.0000 mg | ORAL_TABLET | Freq: Three times a day (TID) | ORAL | Status: DC
  Administered 2023-03-11 – 2023-03-12 (×3): 10 mg via ORAL
  Filled 2023-03-11 (×3): qty 1

## 2023-03-11 MED ORDER — ROSUVASTATIN CALCIUM 5 MG PO TABS
10.0000 mg | ORAL_TABLET | Freq: Every day | ORAL | Status: DC
  Administered 2023-03-11 – 2023-03-14 (×4): 10 mg via ORAL
  Filled 2023-03-11 (×4): qty 2

## 2023-03-11 MED ORDER — SODIUM CHLORIDE 0.9 % IV SOLN
2.0000 g | INTRAVENOUS | Status: DC
  Administered 2023-03-11: 2 g via INTRAVENOUS
  Filled 2023-03-11: qty 12.5

## 2023-03-11 MED ORDER — CARVEDILOL 3.125 MG PO TABS
3.1250 mg | ORAL_TABLET | Freq: Two times a day (BID) | ORAL | Status: DC
  Administered 2023-03-11 – 2023-03-12 (×3): 3.125 mg via ORAL
  Filled 2023-03-11 (×3): qty 1

## 2023-03-11 MED ORDER — VANCOMYCIN HCL 750 MG/150ML IV SOLN: 750.0000 mg | INTRAVENOUS | Status: DC

## 2023-03-11 NOTE — Progress Notes (Signed)
 eLink Physician-Brief Progress Note Patient Name: Amber Bolton DOB: 11/04/1929 MRN: [id number]   Date of Service  03/11/2023  HPI/Events of Note  89 yo female presented with worsening sharp pain at R shoulder blade that intermittently goes all the way down her back noted to have a type B aortic dissection.   On esmolol , nicardipine  on hold   eICU Interventions  Adjust goals per Vascular Surgery -SBP goal <130, maintain HR < 60  Initiate nicardipine  since HR controlled     Intervention Category Intermediate Interventions: Hypertension - evaluation and management  Bergen Magner 03/11/2023, 3:08 AM

## 2023-03-11 NOTE — Progress Notes (Addendum)
  Progress Note    03/11/2023 7:56 AM * No surgery found *  Subjective:  soreness R side and back   Vitals:   03/11/23 0630 03/11/23 0645  BP: 127/66 115/80  Pulse: 60 61  Resp: 20 17  Temp:    SpO2: 95% 96%   Physical Exam: Lungs:  non labored Extremities:  palpable R PT pulse; L foot warm with good cap refill; symmetrical radial pulses Abdomen:  soft, NT, ND Neurologic: A&O; motor and sensation intact BLE  CBC    Component Value Date/Time   WBC 5.0 03/10/2023 0626   RBC 5.17 (H) 03/10/2023 0626   HGB 15.2 (H) 03/10/2023 0626   HCT 46.3 (H) 03/10/2023 0626   PLT 160 03/10/2023 0626   MCV 89.6 03/10/2023 0626   MCH 29.4 03/10/2023 0626   MCHC 32.8 03/10/2023 0626   RDW 12.1 03/10/2023 0626   LYMPHSABS 1.0 03/10/2023 0045   MONOABS 0.7 03/10/2023 0045   EOSABS 0.1 03/10/2023 0045   BASOSABS 0.0 03/10/2023 0045    BMET    Component Value Date/Time   NA 136 03/10/2023 1007   K 4.2 03/10/2023 1007   CL 106 03/10/2023 1007   CO2 22 03/10/2023 1007   GLUCOSE 182 (H) 03/10/2023 1007   BUN 11 03/10/2023 1007   CREATININE 1.20 (H) 03/10/2023 1007   CALCIUM  9.4 03/10/2023 1007   GFRNONAA 43 (L) 03/10/2023 1007   GFRAA 52 (L) 12/31/2015 1623    INR    Component Value Date/Time   INR 1.0 06/16/2022 1225     Intake/Output Summary (Last 24 hours) at 03/11/2023 0756 Last data filed at 03/11/2023 0630 Gross per 24 hour  Intake 716.44 ml  Output 750 ml  Net -33.56 ml     Assessment/Plan:  89 y.o. female with aortic dissection  Subjectively without chest pain this morning.  She has mild soreness in the distribution of her rash.  No signs or symptoms of malperfusion on exam.  She is tolerating a diet.  BLE are well perfused.  NO indication for surgical intervention at this time.  Repeat CTA c/a/p tomorrow as long as she remains asymptomatic.   Amber Sender, PA-C [facility] [phone] 03/11/2023 7:56 AM  I agree with the above.  No  signs of malperfusion.  Plan for repeat CTA tomorrow (ordered)  Amber Bolton

## 2023-03-11 NOTE — Progress Notes (Signed)
 Pharmacy Antibiotic Note  Amber Bolton is a 89 y.o. female admitted on 03/09/2023 with an aortic dissection and now with possible sepsis.  Pharmacy has been consulted for cefepime  and vancomycin  dosing.  -WBC= 5, LA= 3.1 (trend up) -SCr= 1.23 (baseline ~ 1.1), CrCl ~ 20  Plan: -Cefepime  2gm IV q24h -vancomycin  1000mg  IV x1 followed by 750mg  IV q48hr (estimated AUC= 500) -Will follow renal function, cultures and clinical progress   Height: 4' 11 (149.9 cm) Weight: 49.4 kg (108 lb 14.5 oz) IBW/kg (Calculated) : 43.2  Temp (24hrs), Avg:98.5 F (36.9 C), Min:98 F (36.7 C), Max:99 F (37.2 C)  Recent Labs  Lab 03/10/23 0016 03/10/23 0045 03/10/23 0152 03/10/23 0626 03/10/23 1007 03/11/23 0933 03/11/23 1231 03/11/23 1559  WBC  --  4.8  --  5.0  --   --   --   --   CREATININE 1.10*  --  1.16* 1.31* 1.20* 1.23*  --   --   LATICACIDVEN  --   --   --   --   --   --  2.2* 3.1*    Estimated Creatinine Clearance: 20.3 mL/min (A) (by C-G formula based on SCr of 1.23 mg/dL (H)).    Allergies  Allergen Reactions   Penicillins Rash   Shellfish Allergy Itching    Mouth itches    Antimicrobials this admission: 1/9 cefepime  1/9 vanc  Dose adjustments this admission:   Microbiology results:   Thank you for allowing pharmacy to be a part of this patient's care.  Prentice Poisson, PharmD Clinical Pharmacist **Pharmacist phone directory can now be found on [email or web address] ([email or web address]).  Listed under [facility].

## 2023-03-11 NOTE — TOC CM/SW Note (Signed)
 Transition of Care [facility]) - Inpatient Brief Assessment   Patient Details  Name: Amber Bolton MRN: [id number] Date of Birth: 1929/09/01  Transition of [facility]) CM/SW Contact:    Sudie Erminio Deems, RN Phone Number: 03/11/2023, 3:37 PM   Clinical Narrative: Patient presented for worsening right shoulder pain. Per MD notes, patient noted to have a type B aortic dissection. PTA patient was from home with son and also has support of daughter. Patient has DME rolling walker, bedside commode, and wheelchair in the home. Case Manager will continue to follow for transition of care needs as the patient progresses.    Transition of Care Asessment: Insurance and Status: Insurance coverage has been reviewed Patient has primary care physician: Yes Social Drivers of Health Review: SDOH reviewed no interventions necessary Readmission risk has been reviewed: Yes Transition of care needs: transition of care needs identified, TOC will continue to follow

## 2023-03-11 NOTE — Progress Notes (Signed)
 NAME:  SHRONDA BOEH, MRN:  [id number], DOB:  [birth year], LOS: 1 ADMISSION DATE:  03/09/2023, CONSULTATION DATE:  03/10/23 REFERRING MD:  EDP, CHIEF COMPLAINT:  type B Aortic dissection   History of Present Illness:  89 yo female presented with worsening sharp pain at R shoulder blade that intermittently goes all the way down her back, pain started approximately 3-4 days ago but has worsened. Pt states she had been trying to utilize different creams to area and heating pad without relief. She denies n/v/d, cp/dizziness. Denies numbness or tingling other than in her hands intermittently but she states this is not new. Otherwise she has been in her normal state of health.   BP on presentation was 180's systolic, HR in 80's. 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 She was started on esmolol  and cardene  infusions. Vascular surgery has been notified and will see in am and has requested ccm admit pt to ICU for monitoring.   Pt endorses improvement in her pain since presentation.  Pertinent  Medical History  Anxiety GERD Hyperlipidemia CKD3a  Significant Hospital Events: Including procedures, antibiotic start and stop dates in addition to other pertinent events   Admitted to ICU 1/8  Interim History / Subjective:  No pain today. BP rose overnight, started on nicardipine .   Objective   Blood pressure 115/80, pulse 61, temperature 98 F (36.7 C), temperature source Oral, resp. rate 17, height 4' 11 (1.499 m), weight 49.4 kg, SpO2 96%.        Intake/Output Summary (Last 24 hours) at 03/11/2023 0759 Last data filed at 03/11/2023 0630 Gross per 24 hour  Intake 716.44 ml  Output 750 ml  Net -33.56 ml   Filed Weights   03/09/23 2230 03/10/23 0500  Weight: 57.9 kg 49.4 kg    Examination: General: frail appearing elderly woman lying in bed in NAD HENT: [place]/AT, eyes anicteric Lungs: breathing comfortably on [place], CTAB Cardiovascular: S1S2, bradycardia,  reg rhythm Abdomen: soft, NT Extremities: no cyanosis or edema, minimal muscle mass Neuro: awake, alert, hard of hearing, moving all extremities Derm: rash on R chest wall still red & pustular  BMP pending  Resolved Hospital Problem list     Assessment & Plan:  Type B dissection; recent decrease in antihypertensives as an outpatient -keep HR <60, SBP<130> at goal- on cardene  & esmolol  -con't PTA norvasc  -start coreg  3.125, hydralazine  10mg  TID -monitor on tele -PTA tramadol , PRN fentanyl  for pain -repeat CT chest tomorrow  HLD -con't crestor    Shingles- R chest wall -airborne and contact isolation -valtrex  -gabapentin  TID for pain    H/o seizures -con't PTA keppra    Hyperglycemia, diabetes. A1c 7.7 -SSI PRN -goal BG 140-180 -carb modified diet  Deconditioning -PT, OT  Hard of hearing -delirium precautions -ask family to bring hearing aids in   Best Practice (right click and Reselect all SmartList Selections daily)   Diet/type: Regular consistency (see orders) DVT prophylaxis SCD Pressure ulcer(s): N/A GI prophylaxis: PPI Lines: N/A Foley:  N/A Code Status:  DNR Last date of multidisciplinary goals of care discussion [discussed with pt, daughter and grand daughter 03/10/23]  Labs   CBC: Recent Labs  Lab 03/10/23 0016 03/10/23 0045 03/10/23 0626  WBC  --  4.8 5.0  NEUTROABS  --  3.0  --   HGB 15.6* 15.7* 15.2*  HCT 46.0 47.4* 46.3*  MCV  --  89.8 89.6  PLT  --  147* 160  Basic Metabolic Panel: Recent Labs  Lab 03/09/23 2230 03/10/23 0016 03/10/23 0152 03/10/23 0218 03/10/23 0626 03/10/23 1007  NA 138 139 139  --  137 136  K 5.5* 3.1* 3.1*  --  3.5 4.2  CL 103 104 103  --  101 106  CO2 25  --  24  --  23 22  GLUCOSE 137* 135* 134*  --  180* 182*  BUN 10 10 11   --  12 11  CREATININE 1.21* 1.10* 1.16*  --  1.31* 1.20*  CALCIUM  10.2  --  10.0  --  9.9 9.4  MG  --   --   --  1.8  --   --    GFR: Estimated Creatinine Clearance: 20.8  mL/min (A) (by C-G formula based on SCr of 1.2 mg/dL (H)). Recent Labs  Lab 03/10/23 0045 03/10/23 0626  WBC 4.8 5.0    Liver Function Tests: Recent Labs  Lab 03/09/23 2230  AST 33  ALT 12  ALKPHOS 63  BILITOT 1.6*  PROT 6.9  ALBUMIN  3.7   Recent Labs  Lab 03/09/23 2230  LIPASE 31     Critical care time:     This patient is critically ill with multiple organ system failure which requires frequent high complexity decision making, assessment, support, evaluation, and titration of therapies. This was completed through the application of advanced monitoring technologies and extensive interpretation of multiple databases. During this encounter critical care time was devoted to patient care services described in this note for 36 minutes.  Leita SHAUNNA Gaskins, DO 03/11/23 7:59 AM [facility]  For contact information, see Amion. If no response to pager, please call PCCM consult pager. After hours, 7PM- 7AM, please call Elink.

## 2023-03-11 NOTE — Progress Notes (Signed)
 LA rising, no clear reason identified. BP has been controlled today.  -Blood cultures, empiric antibiotics 1L LR bolus -d/w vascular surgery, going to get her follow up CT scan tonight to ensure no extension of her dissection   Leita SHAUNNA Gaskins, DO 03/11/23 6:48 PM [facility]  For contact information, see Amion. If no response to pager, please call PCCM consult pager. After hours, 7PM- 7AM, please call Elink.

## 2023-03-12 ENCOUNTER — Inpatient Hospital Stay (HOSPITAL_COMMUNITY): Payer: 59

## 2023-03-12 DIAGNOSIS — I71012 Dissection of descending thoracic aorta: Secondary | ICD-10-CM | POA: Diagnosis not present

## 2023-03-12 LAB — CBC
HCT: 45.3 % (ref 36.0–46.0)
Hemoglobin: 14.6 g/dL (ref 12.0–15.0)
MCH: 29.5 pg (ref 26.0–34.0)
MCHC: 32.2 g/dL (ref 30.0–36.0)
MCV: 91.5 fL (ref 80.0–100.0)
Platelets: 106 10*3/uL — ABNORMAL LOW (ref 150–400)
RBC: 4.95 MIL/uL (ref 3.87–5.11)
RDW: 12 % (ref 11.5–15.5)
WBC: 5.8 10*3/uL (ref 4.0–10.5)
nRBC: 0 % (ref 0.0–0.2)

## 2023-03-12 LAB — BASIC METABOLIC PANEL
Anion gap: 9 (ref 5–15)
BUN: 14 mg/dL (ref 8–23)
CO2: 19 mmol/L — ABNORMAL LOW (ref 22–32)
Calcium: 9 mg/dL (ref 8.9–10.3)
Chloride: 106 mmol/L (ref 98–111)
Creatinine, Ser: 1.12 mg/dL — ABNORMAL HIGH (ref 0.44–1.00)
GFR, Estimated: 46 mL/min — ABNORMAL LOW (ref >60)
Glucose, Bld: 142 mg/dL — ABNORMAL HIGH (ref 70–99)
Potassium: 5 mmol/L (ref 3.5–5.1)
Sodium: 134 mmol/L — ABNORMAL LOW (ref 135–145)

## 2023-03-12 LAB — GLUCOSE, CAPILLARY
Glucose-Capillary: 122 mg/dL — ABNORMAL HIGH (ref 70–99)
Glucose-Capillary: 160 mg/dL — ABNORMAL HIGH (ref 70–99)
Glucose-Capillary: 106 mg/dL — ABNORMAL HIGH (ref 70–99)
Glucose-Capillary: 137 mg/dL — ABNORMAL HIGH (ref 70–99)
Glucose-Capillary: 135 mg/dL — ABNORMAL HIGH (ref 70–99)
Glucose-Capillary: 107 mg/dL — ABNORMAL HIGH (ref 70–99)

## 2023-03-12 LAB — PROCALCITONIN: Procalcitonin: 0.11 ng/mL

## 2023-03-12 MED ORDER — IOHEXOL 350 MG/ML SOLN
75.0000 mL | Freq: Once | INTRAVENOUS | Status: AC | PRN
  Administered 2023-03-12: 65 mL via INTRAVENOUS

## 2023-03-12 MED ORDER — CARVEDILOL 6.25 MG PO TABS
6.2500 mg | ORAL_TABLET | Freq: Two times a day (BID) | ORAL | Status: DC
  Administered 2023-03-12 – 2023-03-13 (×2): 6.25 mg via ORAL
  Filled 2023-03-12 (×2): qty 1

## 2023-03-12 MED ORDER — LEVETIRACETAM 250 MG PO TABS
250.0000 mg | ORAL_TABLET | Freq: Two times a day (BID) | ORAL | Status: DC
  Administered 2023-03-12 – 2023-03-14 (×5): 250 mg via ORAL
  Filled 2023-03-12 (×5): qty 1

## 2023-03-12 MED ORDER — ENOXAPARIN SODIUM 30 MG/0.3ML IJ SOSY
30.0000 mg | PREFILLED_SYRINGE | INTRAMUSCULAR | Status: DC
  Administered 2023-03-12 – 2023-03-14 (×3): 30 mg via SUBCUTANEOUS
  Filled 2023-03-12 (×3): qty 0.3

## 2023-03-12 MED ORDER — HYDRALAZINE HCL 50 MG PO TABS
50.0000 mg | ORAL_TABLET | Freq: Three times a day (TID) | ORAL | Status: DC
  Administered 2023-03-12 – 2023-03-14 (×6): 50 mg via ORAL
  Filled 2023-03-12 (×6): qty 1

## 2023-03-12 NOTE — Evaluation (Signed)
 Occupational Therapy Evaluation Patient Details Name: Amber Bolton MRN: [id number] DOB: 1930-01-03 Today's Date: 03/12/2023   History of Present Illness 89 y.o. female admitted 03/10/23 with R shoulder blade pain. Workup revealed type B aortic dissection with 4.1cm aneurysm. Dissection stable on repeat CTA 1/10; plan for medical management with tight BP/HR control. Per vascular, symptoms likely related to shingles. PMH includes HTN, OSA, TIA, arthritis, failure to thrive, R hip fx.   Clinical Impression   At baseline, pt completes ADLs Independent to Supervision, performs functional mobility with a RW with Mod I and requires Supervision to Contact guard assist for tub/shower transfer. At baseline, pt receives assistance from family for *meal prep, home management tasks, and transportation. Pt now presents with decreased activity tolerance, generalized B UE weakness, edematous R UE, decreased balance during functional tasks, and decreased safety and independence with functional tasks. Pt currently demonstrates ability to complete UB ADLs Independent to Contact guard assist, LB ADLs with Contact guard to Min assist, and functional transfers with hand held assist +1 with Contact guard assist. Pt HR in the mid-50s to mid-60s throughout session. Pt on 2L continuous O2 through nasal cannula with O2 sat 100% upon OT arrival. O2 removed during session with O2 sat remaining laregely >/92% with brief drop to 90% during transfer with quick recovery to 93% with seated rest break on RA. BP soft but stable with pt reporting no dizziness or lightheadedness. Pt participated well in session and is motivated to return to PLOF. Pt will benefit from acute skilled OT services to address deficits outlined below and to increase safety and independence with functional tasks. Post acute discharge, pt will benefit from continued skilled OT services in the home to maximize rehab potential.       If plan is discharge home,  recommend the following: A little help with walking and/or transfers;A little help with bathing/dressing/bathroom;Assistance with cooking/housework;Assist for transportation;Help with stairs or ramp for entrance    Functional Status Assessment  Patient has had a recent decline in their functional status and demonstrates the ability to make significant improvements in function in a reasonable and predictable amount of time.  Equipment Recommendations  None recommended by OT (Pt already has needed equipment)    Recommendations for Other Services       Precautions / Restrictions Precautions Precautions: Fall Restrictions Weight Bearing Restrictions Per Provider Order: No      Mobility Bed Mobility Overal bed mobility: Needs Assistance Bed Mobility: Supine to Sit     Supine to sit: Contact guard, HOB elevated, Used rails     General bed mobility comments: increased time and min cues for technique    Transfers Overall transfer level: Needs assistance Equipment used: 1 person hand held assist Transfers: Sit to/from Stand, Bed to chair/wheelchair/BSC Sit to Stand: Contact guard assist     Step pivot transfers: Contact guard assist            Balance Overall balance assessment: Needs assistance Sitting-balance support: Single extremity supported, No upper extremity supported, Feet supported Sitting balance-Leahy Scale: Good     Standing balance support: Single extremity supported, During functional activity, No upper extremity supported Standing balance-Leahy Scale: Fair Standing balance comment: Able to maintain statick stand; requires unilateeral UE support to maintain balance in dynamic stand                           ADL either performed or assessed with clinical judgement  ADL Overall ADL's : Needs assistance/impaired Eating/Feeding: Independent;Sitting   Grooming: Set up;Sitting   Upper Body Bathing: Supervision/ safety;Set up;Sitting   Lower  Body Bathing: Minimal assistance;Sitting/lateral leans;Sit to/from stand   Upper Body Dressing : Contact guard assist;Sitting Upper Body Dressing Details (indicate cue type and reason): assist with line management Lower Body Dressing: Contact guard assist;Minimal assistance;Sitting/lateral leans;Sit to/from stand   Toilet Transfer: Contact guard assist;BSC/3in1 (step-pivot; hand held assist +1) Toilet Transfer Details (indicate cue type and reason): simulated to recliner Toileting- Clothing Manipulation and Hygiene: Minimal assistance;Sitting/lateral lean;Sit to/from stand         General ADL Comments: Pt with decreased activity tolerance, fatiguing quickly during tasks.     Vision Baseline Vision/History: 1 Wears glasses (at home) Ability to See in Adequate Light: 1 Impaired (pt reports vision well corrected with glasses which are at home) Patient Visual Report: No change from baseline       Perception         Praxis         Pertinent Vitals/Pain Pain Assessment Pain Assessment: No/denies pain     Extremity/Trunk Assessment Upper Extremity Assessment Upper Extremity Assessment: Right hand dominant;Generalized weakness;RUE deficits/detail;LUE deficits/detail RUE Deficits / Details: generalized weakness; edematous R UE RUE Sensation: WNL RUE Coordination: WNL LUE Deficits / Details: generalized weakness LUE Sensation: WNL LUE Coordination: WNL   Lower Extremity Assessment Lower Extremity Assessment: Defer to PT evaluation       Communication Communication Communication: Hearing impairment (has hearing aids but they are at home) Cueing Techniques: Verbal cues;Visual cues   Cognition Arousal: Alert Behavior During Therapy: WFL for tasks assessed/performed Overall Cognitive Status: Within Functional Limits for tasks assessed                                 General Comments: AAOx4 and pleasnat throughout. Mild short-term memory deficits and mildly  decreased safety awareness noted, but with cognition WFL.     General Comments  HR in the id-50s to mid-60s throughout session. Pt on 2L continuous O2 through nasal cannula with O2 sat 100% upon OT arrival. O2 removed during session with O2 sat remaining laregely >/92% with brief drop to 90% during transfer and quick recovery to 93% with seated rest break on RA. BP soft but stable with pt reporting no dizziness or lightheadedness.    Exercises     Shoulder Instructions      Home Living Family/patient expects to be discharged to:: Private residence Living Arrangements: Children (son) Available Help at Discharge: Family;Available 24 hours/day (son now retired; daughter lives nearby and can also assist PRN) Type of Home: House Home Access: Level entry     Home Layout: One level     Bathroom Shower/Tub: Chief Strategy Officer: Handicapped height     Home Equipment: Agricultural Consultant (2 wheels);BSC/3in1;Shower seat;Hand held Stage Manager (4 wheels)          Prior Functioning/Environment Prior Level of Function : Needs assist             Mobility Comments: Modified independent with RW ADLs Comments: Independent to Mod I with dressing, toileting, self-feeding, and grooming. Family provides Supervision to [facility] for tub/shower transfers with pt completing remainder of shower with Mod I. Family assists with meal preep, home management tasks, and transportation.        OT Problem List: Decreased strength;Decreased activity tolerance;Impaired balance (sitting and/or standing);Increased edema  OT Treatment/Interventions: Self-care/ADL training;Therapeutic exercise;Energy conservation;DME and/or AE instruction;Therapeutic activities;Patient/family education;Balance training    OT Goals(Current goals can be found in the care plan section) Acute Rehab OT Goals Patient Stated Goal: to return home and stay as independent as possible OT Goal Formulation: With  patient Time For Goal Achievement: 03/26/23 Potential to Achieve Goals: Good ADL Goals Pt Will Perform Grooming: with modified independence;standing Pt Will Perform Lower Body Bathing: with supervision;sit to/from stand;sitting/lateral leans Pt Will Perform Lower Body Dressing: with modified independence;sit to/from stand;sitting/lateral leans Pt Will Transfer to Toilet: with modified independence;ambulating;regular height toilet (with least restrictive AD) Pt Will Perform Toileting - Clothing Manipulation and hygiene: with modified independence;sitting/lateral leans;sit to/from stand Additional ADL Goal #1: Patient will demonstrate ability to Independently state 3 energy conservation strategies for increased safety and independence with functional tasks. Additional ADL Goal #2: Patient with demonstrate understanding of education in R UE positioning and AROM exercises to assist in managing edema through teach back.  OT Frequency: Min 1X/week    Co-evaluation              AM-PAC OT 6 Clicks Daily Activity     Outcome Measure Help from another person eating meals?: None Help from another person taking care of personal grooming?: A Little Help from another person toileting, which includes using toliet, bedpan, or urinal?: A Little Help from another person bathing (including washing, rinsing, drying)?: A Little Help from another person to put on and taking off regular upper body clothing?: A Little Help from another person to put on and taking off regular lower body clothing?: A Little 6 Click Score: 19   End of Session Equipment Utilized During Treatment: Oxygen Nurse Communication: Mobility status;Other (comment) (Vital signs; Pt with small amount  of bleeding at L wrist IV site. Pt with R UE in elevated position in recliner to address edematous R UE.)  Activity Tolerance: Patient tolerated treatment well Patient left: in chair;with call bell/phone within reach;with nursing/sitter  in room  OT Visit Diagnosis: Unsteadiness on feet (R26.81);Muscle weakness (generalized) (M62.81);Other (comment) (decreased activity tolerance)                Time: 8963-8887 OT Time Calculation (min): 36 min Charges:  OT General Charges $OT Visit: 1 Visit OT Evaluation $OT Eval Low Complexity: 1 Low OT Treatments $Self Care/Home Management : 8-22 mins  Margarie Rockey HERO., OTR/L, MA Acute Rehab [phone]  Margarie FORBES Horns 03/12/2023, 12:45 PM

## 2023-03-12 NOTE — Progress Notes (Addendum)
  Progress Note    03/12/2023 7:51 AM * No surgery found *  Subjective:  No pain on exam this morning   Vitals:   03/12/23 0630 03/12/23 0645  BP: (!) 125/59 115/65  Pulse: (!) 56 64  Resp: 18 (!) 21  Temp:    SpO2: 94% 97%   Physical Exam: Lungs:  non labored Skin: blistering rash R chest Extremities:  palpable R DP pulse; brisk L DP by doppler Abdomen:  soft, NT, ND Neurologic: A&O  CBC    Component Value Date/Time   WBC 5.0 03/10/2023 0626   RBC 5.17 (H) 03/10/2023 0626   HGB 15.2 (H) 03/10/2023 0626   HCT 46.3 (H) 03/10/2023 0626   PLT 160 03/10/2023 0626   MCV 89.6 03/10/2023 0626   MCH 29.4 03/10/2023 0626   MCHC 32.8 03/10/2023 0626   RDW 12.1 03/10/2023 0626   LYMPHSABS 1.0 03/10/2023 0045   MONOABS 0.7 03/10/2023 0045   EOSABS 0.1 03/10/2023 0045   BASOSABS 0.0 03/10/2023 0045    BMET    Component Value Date/Time   NA 135 03/11/2023 0933   K 4.9 03/11/2023 0933   CL 108 03/11/2023 0933   CO2 17 (L) 03/11/2023 0933   GLUCOSE 180 (H) 03/11/2023 0933   BUN 13 03/11/2023 0933   CREATININE 1.23 (H) 03/11/2023 0933   CALCIUM  9.4 03/11/2023 0933   GFRNONAA 41 (L) 03/11/2023 0933   GFRAA 52 (L) 12/31/2015 1623    INR    Component Value Date/Time   INR 1.0 06/16/2022 1225     Intake/Output Summary (Last 24 hours) at 03/12/2023 0751 Last data filed at 03/12/2023 0600 Gross per 24 hour  Intake 2835.86 ml  Output 1730 ml  Net 1105.86 ml     Assessment/Plan:  89 y.o. female with type B dissection  Subjectively, the patient is comfortable without pain this morning No signs or symptoms of malperfusion on exam Dissection is stable on repeat CTA; continue tight BP/HR control Call vascular with questions over the weekend    Amber Sender, PA-C [facility] [phone] 03/12/2023 7:51 AM   Reviewed the.  Repeat CT scan does not show any significant change and possible slight improvement of the ascending thoracic  dissection.  She remains without signs of malperfusion.  I believe that her symptoms are related to her shingles.  No vascular intervention is recommended at this time.  I will have her follow-up in the office in 6 weeks with repeat CT scan.  Amber Bolton

## 2023-03-12 NOTE — Progress Notes (Signed)
 PT Cancellation Note  Patient Details Name: Amber Bolton MRN: [id number] DOB: Aug 21, 1929   Cancelled Treatment:    Reason Eval/Treat Not Completed: Patient up to recliner with OT, then back to bed for line placement with RN, just sitting back down in recliner to eat lunch. Will follow up for PT Evaluation as schedule permits.  Tracyann Duffell, PT, DPT Acute Rehabilitation Services  Personal: Secure Chat Rehab Office: [phone]  Amber Bolton 03/12/2023, 12:03 PM

## 2023-03-12 NOTE — Progress Notes (Signed)
 Chaplain responded to a request to provide Advance Care Directives information for Pt. Pt received information about how to fill the form and requested time to ask her children to fill the form for her (children were not at bedside). Pt also shared with Chaplain that she felt tired and wanted to be back on her feet. Pt requested Chaplain to pray for her, and both prayed together, for her strength and patience. Chaplain asked Pt to notify the Spiritual Care office when the form was ready by her children, so someone from this office can finalize the form, notarize the document, and distribute copies.  Eric Fortune Chaplain Resident   03/12/23 1051  Spiritual Encounters  Type of Visit Initial  Care provided to: Patient  Conversation partners present during encounter Nurse  Referral source Clinical staff  Reason for visit Advance directives  OnCall Visit No  Spiritual Framework  Presenting Themes Courage hope and growth;Goals in life/care;Meaning/purpose/sources of inspiration  Community/Connection Faith community  Patient Stress Factors Exhausted

## 2023-03-12 NOTE — Progress Notes (Signed)
   NAME:  Amber Bolton, MRN:  [id number], DOB:  [birth year], LOS: 2 ADMISSION DATE:  03/09/2023, CONSULTATION DATE:  03/10/23 REFERRING MD:  EDP, CHIEF COMPLAINT:  type B Aortic dissection   History of Present Illness:  89 yo female presented with worsening sharp pain at R shoulder blade that intermittently goes all the way down her back, pain started approximately 3-4 days ago but has worsened. Pt states she had been trying to utilize different creams to area and heating pad without relief. She denies n/v/d, cp/dizziness. Denies numbness or tingling other than in her hands intermittently but she states this is not new. Otherwise she has been in her normal state of health.   BP on presentation was 180's systolic, HR in 80's. 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 She was started on esmolol  and cardene  infusions. Vascular surgery has been notified and will see in am and has requested ccm admit pt to ICU for monitoring.   Pt endorses improvement in her pain since presentation.  Pertinent  Medical History  Anxiety GERD Hyperlipidemia CKD3a  Significant Hospital Events: Including procedures, antibiotic start and stop dates in addition to other pertinent events   Admitted to ICU 1/8  Interim History / Subjective:  No events. Pain resolved.  Objective   Blood pressure 115/65, pulse 64, temperature 98.5 F (36.9 C), temperature source Oral, resp. rate (!) 21, height 4' 11 (1.499 m), weight 59.6 kg, SpO2 97%.        Intake/Output Summary (Last 24 hours) at 03/12/2023 0748 Last data filed at 03/12/2023 0600 Gross per 24 hour  Intake 2835.86 ml  Output 1730 ml  Net 1105.86 ml   Filed Weights   03/09/23 2230 03/10/23 0500 03/12/23 0500  Weight: 57.9 kg 49.4 kg 59.6 kg    Examination: Frail woman in NAD Lungs clear Shingles rash noted Moves to command Very pleasant MMM, trachea midline Ext warm  AM labs pending  Resolved Hospital Problem  list     Assessment & Plan:  Type B dissection; recent decrease in antihypertensives as an outpatient- stable on imaging HLD Shingles- R chest wall H/o seizures Hyperglycemia, diabetes. A1c 7.7 Deconditioning OSA noncompliant with CPAP Hx bilateral subdural hematomas Hard of hearing  - Continue valtrex  and gabapentin  - As pain improves so will HR/BP - Check Pct, if neg DC abx - Titrate up BP meds PRN - No further need to trend lactate - Continue PTA keppra   Best Practice (right click and Reselect all SmartList Selections daily)   Diet/type: Regular consistency (see orders) DVT prophylaxis lovenox  Pressure ulcer(s): N/A GI prophylaxis: PPI Lines: N/A Foley:  N/A Code Status:  DNR Last date of multidisciplinary goals of care discussion [discussed with pt, daughter and grand daughter 03/10/23]  31 min cc time Rolan Sharps MD PCCM For contact information, see Amion. If no response to pager, please call PCCM consult pager. After hours, 7PM- 7AM, please call Elink.

## 2023-03-12 NOTE — Progress Notes (Signed)
 eLink Physician-Brief Progress Note Patient Name: SCOTTI MOTTER DOB: 01/20/1930 MRN: [id number]   Date of Service  03/12/2023  HPI/Events of Note  89 yo female presented with worsening sharp pain at R shoulder blade that intermittently goes all the way down her back noted to have a type B aortic dissection.   Persistent lactic acidosis, concern for vascular leak.  Received 1 L bolus and lactic acid still 2.7  eICU Interventions  Pursue CT angiography sooner than later, aiming for tonight but difficulty coordinating in the setting of her precautions     Intervention Category Intermediate Interventions: Bleeding - evaluation and treatment with blood products  Ezequiel Macauley 03/12/2023, 12:29 AM

## 2023-03-13 DIAGNOSIS — I71012 Dissection of descending thoracic aorta: Secondary | ICD-10-CM | POA: Diagnosis not present

## 2023-03-13 LAB — CBC
HCT: 41.4 % (ref 36.0–46.0)
Hemoglobin: 14 g/dL (ref 12.0–15.0)
MCH: 30.2 pg (ref 26.0–34.0)
MCHC: 33.8 g/dL (ref 30.0–36.0)
MCV: 89.2 fL (ref 80.0–100.0)
Platelets: 132 10*3/uL — ABNORMAL LOW (ref 150–400)
RBC: 4.64 MIL/uL (ref 3.87–5.11)
RDW: 12.1 % (ref 11.5–15.5)
WBC: 5.1 10*3/uL (ref 4.0–10.5)
nRBC: 0 % (ref 0.0–0.2)

## 2023-03-13 LAB — BASIC METABOLIC PANEL
Anion gap: 6 (ref 5–15)
BUN: 10 mg/dL (ref 8–23)
CO2: 22 mmol/L (ref 22–32)
Calcium: 9.3 mg/dL (ref 8.9–10.3)
Chloride: 110 mmol/L (ref 98–111)
Creatinine, Ser: 1.38 mg/dL — ABNORMAL HIGH (ref 0.44–1.00)
GFR, Estimated: 36 mL/min — ABNORMAL LOW (ref >60)
Glucose, Bld: 105 mg/dL — ABNORMAL HIGH (ref 70–99)
Potassium: 3.7 mmol/L (ref 3.5–5.1)
Sodium: 138 mmol/L (ref 135–145)

## 2023-03-13 LAB — GLUCOSE, CAPILLARY
Glucose-Capillary: 108 mg/dL — ABNORMAL HIGH (ref 70–99)
Glucose-Capillary: 106 mg/dL — ABNORMAL HIGH (ref 70–99)
Glucose-Capillary: 130 mg/dL — ABNORMAL HIGH (ref 70–99)
Glucose-Capillary: 112 mg/dL — ABNORMAL HIGH (ref 70–99)
Glucose-Capillary: 105 mg/dL — ABNORMAL HIGH (ref 70–99)

## 2023-03-13 MED ORDER — CARVEDILOL 12.5 MG PO TABS
12.5000 mg | ORAL_TABLET | Freq: Two times a day (BID) | ORAL | Status: DC
  Administered 2023-03-13 – 2023-03-14 (×2): 12.5 mg via ORAL
  Filled 2023-03-13 (×2): qty 1

## 2023-03-13 MED ORDER — ALBUMIN HUMAN 25 % IV SOLN
25.0000 g | Freq: Four times a day (QID) | INTRAVENOUS | Status: AC
  Administered 2023-03-13 – 2023-03-14 (×3): 25 g via INTRAVENOUS
  Filled 2023-03-13 (×4): qty 100

## 2023-03-13 NOTE — Progress Notes (Addendum)
   NAME:  Amber Bolton, MRN:  [id number], DOB:  Nov 24, 1929, LOS: 3 ADMISSION DATE:  03/09/2023, CONSULTATION DATE:  03/10/23 REFERRING MD:  EDP, CHIEF COMPLAINT:  type B Aortic dissection   History of Present Illness:  89 yo female presented with worsening sharp pain at R shoulder blade that intermittently goes all the way down her back, pain started approximately 3-4 days ago but has worsened. Pt states she had been trying to utilize different creams to area and heating pad without relief. She denies n/v/d, cp/dizziness. Denies numbness or tingling other than in her hands intermittently but she states this is not new. Otherwise she has been in her normal state of health.   BP on presentation was 180's systolic, HR in 80's. 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 She was started on esmolol  and cardene  infusions. Vascular surgery has been notified and will see in am and has requested ccm admit pt to ICU for monitoring.   Pt endorses improvement in her pain since presentation.  Pertinent  Medical History  Anxiety GERD Hyperlipidemia CKD3a  Significant Hospital Events: Including procedures, antibiotic start and stop dates in addition to other pertinent events   Admitted to ICU 1/8  Interim History / Subjective:  No events, walked with PT this am, rec'd for [facility].  Objective   Blood pressure 110/76, pulse 76, temperature 99.3 F (37.4 C), temperature source Oral, resp. rate 10, height 4' 11 (1.499 m), weight 55.3 kg, SpO2 92%.        Intake/Output Summary (Last 24 hours) at 03/13/2023 1331 Last data filed at 03/13/2023 1100 Gross per 24 hour  Intake 478.25 ml  Output 1850 ml  Net -1371.75 ml   Filed Weights   03/10/23 0500 03/12/23 0500 03/13/23 0500  Weight: 49.4 kg 59.6 kg 55.3 kg    Examination: No distress MMM Zoster rash on chest improving Lungs diminished bases Aox3  Cr up slightly Plts better  Resolved Hospital Problem list      Assessment & Plan:  Type B dissection; recent decrease in antihypertensives as an outpatient- stable on imaging HLD Shingles- R chest wall H/o seizures Hyperglycemia, diabetes. A1c 7.7 Deconditioning OSA noncompliant with CPAP Hx bilateral subdural hematomas Hard of hearing Mild AKI  - Continue valtrex  and gabapentin  - Albumin , encourage PO, check AM BMP - Ambulate as able - Increase coreg , continue hydralazine  and amlodipine  - Okay for med/surg, appreciate [facility] taking over starting 03/13/22; remaining issues are O2 wean, Cr stability, and BP/HR optimization - [facility] will follow patient as OP  Rolan Sharps MD PCCM For contact information, see Amion. If no response to pager, please call PCCM consult pager. After hours, 7PM- 7AM, please call Elink.

## 2023-03-13 NOTE — Evaluation (Signed)
 Physical Therapy Evaluation Patient Details Name: Amber Bolton MRN: [id number] DOB: October 21, 1929 Today's Date: 03/13/2023  History of Present Illness  89 y.o. female admitted 03/10/23 with R shoulder blade pain. Workup revealed type B aortic dissection with 4.1cm aneurysm. Dissection stable on repeat CTA 1/10; plan for medical management with tight BP/HR control. Per vascular, symptoms likely related to shingles. PMH includes HTN, OSA, TIA, arthritis, failure to thrive, R hip fx.  Clinical Impression  Pt is presenting below baseline level of functioning. Prior to hospitalization pt was Mod I for functional activity with CGA for ADLs intermittently.  Currently pt is Mod I with sit to stand and bed mobility. Pt was only able to ambulate 5 ft before sitting down in recliner reporting fatigue and generalized weakness with RW. Due to pt current functional status, home set up and available assistance at home recommending skilled physical therapy services 3x/week in order to address strength, balance and functional mobility to decrease risk for falls, injury and re-hospitalization.         If plan is discharge home, recommend the following: A little help with walking and/or transfers;Assist for transportation;Assistance with cooking/housework     Equipment Recommendations None recommended by PT     Functional Status Assessment Patient has had a recent decline in their functional status and demonstrates the ability to make significant improvements in function in a reasonable and predictable amount of time.     Precautions / Restrictions Precautions Precautions: Fall Restrictions Weight Bearing Restrictions Per Provider Order: No      Mobility  Bed Mobility Overal bed mobility: Modified Independent Bed Mobility: Supine to Sit   General bed mobility comments: no use of rails    Transfers Overall transfer level: Needs assistance Equipment used: Rolling walker (2 wheels) Transfers: Sit  to/from Stand, Bed to chair/wheelchair/BSC Sit to Stand: Modified independent (Device/Increase time)   Step pivot transfers: Modified independent (Device/Increase time)       General transfer comment: light use of RW, wide BOS able to improve with cueing. Pt performed 3 times during session without physical assistance.    Ambulation/Gait Ambulation/Gait assistance: Modified independent (Device/Increase time) Gait Distance (Feet): 5 Feet Assistive device: Rolling walker (2 wheels) Gait Pattern/deviations: Step-through pattern, Decreased stride length Gait velocity: decreased Gait velocity interpretation: <1.31 ft/sec, indicative of household ambulator   General Gait Details: short distance. Pt encourage to try for longer distance but pt reports she is weak and tired. Continues to stand up then sit down.       Balance Overall balance assessment: Mild deficits observed, not formally tested, Modified Independent Sitting-balance support: Single extremity supported, Feet unsupported, Feet supported Sitting balance-Leahy Scale: Good     Standing balance support: Single extremity supported, Bilateral upper extremity supported, During functional activity Standing balance-Leahy Scale: Fair Standing balance comment: no overt LOB       Pertinent Vitals/Pain Pain Assessment Pain Assessment: No/denies pain    Home Living Family/patient expects to be discharged to:: Private residence Living Arrangements: Children (son) Available Help at Discharge: Family;Available 24 hours/day (son now retired; daughter lives nearby and can also assist PRN) Type of Home: House Home Access: Level entry       Home Layout: One level Home Equipment: Agricultural Consultant (2 wheels);BSC/3in1;Shower seat;Hand held Stage Manager (4 wheels)      Prior Function Prior Level of Function : Needs assist             Mobility Comments: Modified independent with RW ADLs Comments: Independent to  Mod I  with dressing, toileting, self-feeding, and grooming. Family provides Supervision to [facility] for tub/shower transfers with pt completing remainder of shower with Mod I. Family assists with meal prep, home management tasks, and transportation.     Extremity/Trunk Assessment   Upper Extremity Assessment Upper Extremity Assessment: Defer to OT evaluation    Lower Extremity Assessment Lower Extremity Assessment: Generalized weakness    Cervical / Trunk Assessment Cervical / Trunk Assessment: Normal  Communication   Communication Communication: Hearing impairment Cueing Techniques: Verbal cues;Visual cues  Cognition Arousal: Alert Behavior During Therapy: WFL for tasks assessed/performed Overall Cognitive Status: Within Functional Limits for tasks assessed      General Comments General comments (skin integrity, edema, etc.): HR remained in the 70-80 range throughout session on room air with O2 sats 92%. pt BP 123/63 after first standing then 115/95 after transfer.        Assessment/Plan    PT Assessment Patient needs continued PT services  PT Problem List Decreased strength;Decreased balance;Decreased mobility;Decreased activity tolerance       PT Treatment Interventions DME instruction;Therapeutic activities;Gait training;Therapeutic exercise;Patient/family education;Balance training;Functional mobility training    PT Goals (Current goals can be found in the Care Plan section)  Acute Rehab PT Goals Patient Stated Goal: to return home with family PT Goal Formulation: With patient Time For Goal Achievement: 03/27/23 Potential to Achieve Goals: Good    Frequency Min 1X/week        AM-PAC PT 6 Clicks Mobility  Outcome Measure Help needed turning from your back to your side while in a flat bed without using bedrails?: None Help needed moving from lying on your back to sitting on the side of a flat bed without using bedrails?: None Help needed moving to and from a bed to a  chair (including a wheelchair)?: None Help needed standing up from a chair using your arms (e.g., wheelchair or bedside chair)?: None Help needed to walk in hospital room?: A Little Help needed climbing 3-5 steps with a railing? : A Little 6 Click Score: 22    End of Session Equipment Utilized During Treatment: Gait belt Activity Tolerance: Patient tolerated treatment well Patient left: in chair;with call bell/phone within reach Nurse Communication: Mobility status PT Visit Diagnosis: Other abnormalities of gait and mobility (R26.89);Muscle weakness (generalized) (M62.81)    Time: 9061-8989 PT Time Calculation (min) (ACUTE ONLY): 32 min   Charges:   PT Evaluation $PT Eval Low Complexity: 1 Low PT Treatments $Therapeutic Activity: 8-22 mins PT General Charges $$ ACUTE PT VISIT: 1 Visit         Dorothyann Maier, DPT, CLT  Acute Rehabilitation Services Office: [phone] (Secure chat preferred)   Dorothyann VEAR Maier 03/13/2023, 11:05 AM

## 2023-03-14 DIAGNOSIS — I71012 Dissection of descending thoracic aorta: Secondary | ICD-10-CM | POA: Diagnosis not present

## 2023-03-14 LAB — BASIC METABOLIC PANEL
Anion gap: 9 (ref 5–15)
BUN: 16 mg/dL (ref 8–23)
CO2: 23 mmol/L (ref 22–32)
Calcium: 9.9 mg/dL (ref 8.9–10.3)
Chloride: 109 mmol/L (ref 98–111)
Creatinine, Ser: 1.41 mg/dL — ABNORMAL HIGH (ref 0.44–1.00)
GFR, Estimated: 35 mL/min — ABNORMAL LOW (ref >60)
Glucose, Bld: 117 mg/dL — ABNORMAL HIGH (ref 70–99)
Potassium: 3.4 mmol/L — ABNORMAL LOW (ref 3.5–5.1)
Sodium: 141 mmol/L (ref 135–145)

## 2023-03-14 LAB — GLUCOSE, CAPILLARY
Glucose-Capillary: 115 mg/dL — ABNORMAL HIGH (ref 70–99)
Glucose-Capillary: 119 mg/dL — ABNORMAL HIGH (ref 70–99)
Glucose-Capillary: 141 mg/dL — ABNORMAL HIGH (ref 70–99)
Glucose-Capillary: 220 mg/dL — ABNORMAL HIGH (ref 70–99)

## 2023-03-14 LAB — CBC
HCT: 38.9 % (ref 36.0–46.0)
Hemoglobin: 12.7 g/dL (ref 12.0–15.0)
MCH: 29.3 pg (ref 26.0–34.0)
MCHC: 32.6 g/dL (ref 30.0–36.0)
MCV: 89.8 fL (ref 80.0–100.0)
Platelets: 135 10*3/uL — ABNORMAL LOW (ref 150–400)
RBC: 4.33 MIL/uL (ref 3.87–5.11)
RDW: 12.3 % (ref 11.5–15.5)
WBC: 6.3 10*3/uL (ref 4.0–10.5)
nRBC: 0 % (ref 0.0–0.2)

## 2023-03-14 MED ORDER — CARVEDILOL 12.5 MG PO TABS: 12.5000 mg | ORAL_TABLET | Freq: Two times a day (BID) | ORAL | 2 refills | Status: AC

## 2023-03-14 MED ORDER — SORBITOL 70 % SOLN
30.0000 mL | Freq: Once | Status: AC
  Administered 2023-03-14: 30 mL via ORAL
  Filled 2023-03-14: qty 30

## 2023-03-14 MED ORDER — HYDRALAZINE HCL 50 MG PO TABS: 50.0000 mg | ORAL_TABLET | Freq: Three times a day (TID) | ORAL | 2 refills | Status: DC

## 2023-03-14 MED ORDER — BISACODYL 10 MG RE SUPP
10.0000 mg | Freq: Once | RECTAL | Status: AC
  Administered 2023-03-14: 10 mg via RECTAL
  Filled 2023-03-14: qty 1

## 2023-03-14 MED ORDER — POLYETHYLENE GLYCOL 3350 17 G PO PACK: 17.0000 g | PACK | Freq: Every day | ORAL | 0 refills | Status: DC | PRN

## 2023-03-14 MED ORDER — POLYETHYLENE GLYCOL 3350 17 G PO PACK
17.0000 g | PACK | Freq: Once | ORAL | Status: AC
  Administered 2023-03-14: 17 g via ORAL
  Filled 2023-03-14: qty 1

## 2023-03-14 MED ORDER — VALACYCLOVIR HCL 1 G PO TABS: 1000.0000 mg | ORAL_TABLET | Freq: Every day | ORAL | 0 refills | Status: AC

## 2023-03-14 NOTE — Discharge Instructions (Signed)
 Vascular surgery office and home health should call you sometime next week to set up follow-up

## 2023-03-14 NOTE — Discharge Summary (Signed)
 Physician Discharge Summary  Patient ID: Amber Bolton MRN: [id number] DOB/AGE: [birth year] 89 y.o.  Admit date: 03/09/2023 Discharge date: 03/14/2023  Admission Diagnoses:  Discharge Diagnoses:  Type B aortic dissection Shingles Hypertensive crisis   Discharged Condition: stable  Hospital Course:  Admitted with chest pain found to have possible small Type B dissection. Also found to have concurrent shingles which was probably the real source of her pain.  Started on valtrex . She was maintained on IV antihypertensives and transitioned to an oral regimen. Repeat CTA showed stable/improved small abnormal filling of aorta.  She will follow up with vascular surgery in about 6 weeks and her PCP in a couple weeks for BP checks.  Scripts for hydralazine  and coreg  sent to her [employer]. Ambulatory at time of discharge on RA, home health was ordered for her at recommendation of PT.  Consults:  PT/OT Vascular surgery   Discharge Exam: Blood pressure (!) 120/58, pulse 64, temperature 98.3 F (36.8 C), temperature source Oral, resp. rate 18, height 4' 11 (1.499 m), weight 55.6 kg, SpO2 99%. No distress MMM Heart sounds regular, good pulses Abd soft Healing vesicular rash over R chest in dermatomal distribution Moves to command RASS 0 AOx3   Disposition: Discharge disposition: 01-Home or Self Care        Allergies as of 03/14/2023       Reactions   Penicillins Rash   Shellfish Allergy Itching   Mouth itches        Medication List     TAKE these medications    amLODipine  10 MG tablet Commonly known as: NORVASC  Take 10 mg by mouth daily.   carvedilol  12.5 MG tablet Commonly known as: COREG  Take 1 tablet (12.5 mg total) by mouth 2 (two) times daily with a meal.   feeding supplement Liqd Take 237 mLs by mouth 3 (three) times daily between meals. What changed:  when to take this reasons to take this   hydrALAZINE  50 MG tablet Commonly known as:  APRESOLINE  Take 1 tablet (50 mg total) by mouth 3 (three) times daily.   levETIRAcetam  250 MG tablet Commonly known as: KEPPRA  Take 1 tablet (250 mg total) by mouth 2 (two) times daily.   pantoprazole  40 MG tablet Commonly known as: Protonix  Take 1 tablet (40 mg total) by mouth daily.   polyethylene glycol 17 g packet Commonly known as: MiraLax  Take 17 g by mouth daily as needed for mild constipation or moderate constipation.   rosuvastatin  10 MG tablet Commonly known as: CRESTOR  Take 1 tablet (10 mg total) by mouth daily.   traMADol  50 MG tablet Commonly known as: ULTRAM  Take 50 mg by mouth 2 (two) times daily as needed for moderate pain or severe pain.   triamcinolone cream 0.1 % Commonly known as: KENALOG Apply 1 Application topically daily as needed (rash).   valACYclovir  1000 MG tablet Commonly known as: VALTREX  Take 1 tablet (1,000 mg total) by mouth daily for 5 days. Start taking on: March 15, 2023   Vitamin D (Ergocalciferol) 1.25 MG (50000 UNIT) Caps capsule Commonly known as: DRISDOL Take 50,000 Units by mouth once a week.        Follow-up Information     Serene Gaile ORN, MD Follow up.   Specialties: Vascular Surgery, Cardiology Why: They will call you with appt date/time Contact information: [address] [place] [address] [phone]         Douglass Sayres H, PA-C Follow up.   Specialty: Physician Assistant Why:  Call their office and let them know you were hospitalized and ask if you can follow up in next few weeks for BP check Contact information: [address] [place] [address] [phone]                 Signed: Toribio JAYSON Sharps 03/14/2023, 12:46 PM

## 2023-03-14 NOTE — TOC Transition Note (Signed)
 Transition of Care [facility]) - Discharge Note   Patient Details  Name: Amber Bolton MRN: [id number] Date of Birth: May 06, 1929  Transition [facility] CM/SW Contact:  Robynn Eileen Hoose, RN Phone Number: 03/14/2023, 1:13 PM   Clinical Narrative:    Patient is being discharged today. HH PT/OT orders noted. Cory with [facility] able to accept patient insurance for [facility] PT/OT. Contact information placed on AVS.   Final next level of care: Home w Home Health Services Barriers to Discharge: No Barriers Identified   Patient Goals and CMS Choice            Discharge Placement                       Discharge Plan and Services Additional resources added to the After Visit Summary for                            HH Arranged: PT, OT HH Agency: [facility] Date [facility] Agency Contacted: 03/14/23 Time HH Agency Contacted: [phone] Representative spoke with at [facility] Agency: Darleene  Social Drivers of Health (SDOH) Interventions SDOH Screenings   Food Insecurity: No Food Insecurity (03/10/2023)  Housing: Low Risk  (03/10/2023)  Transportation Needs: No Transportation Needs (03/10/2023)  Utilities: Not At Risk (03/10/2023)  Social Connections: Moderately Isolated (03/10/2023)  Tobacco Use: Low Risk  (03/09/2023)     Readmission Risk Interventions     No data to display

## 2023-03-16 LAB — CULTURE, BLOOD (ROUTINE X 2)
Culture: NO GROWTH
Culture: NO GROWTH
Special Requests: ADEQUATE
Special Requests: ADEQUATE

## 2023-04-02 ENCOUNTER — Ambulatory Visit: Payer: 59 | Admitting: Gastroenterology

## 2023-04-05 ENCOUNTER — Other Ambulatory Visit: Payer: Self-pay

## 2023-04-05 DIAGNOSIS — I71012 Dissection of descending thoracic aorta: Secondary | ICD-10-CM

## 2023-04-26 ENCOUNTER — Encounter: Payer: 59 | Admitting: Surgery

## 2023-05-10 ENCOUNTER — Emergency Department (HOSPITAL_COMMUNITY)
Admission: EM | Admit: 2023-05-10 | Discharge: 2023-05-11 | Disposition: A | Discharge status: Home / Self Care | Attending: Emergency Medicine | Admitting: Emergency Medicine

## 2023-05-10 ENCOUNTER — Other Ambulatory Visit: Payer: Self-pay

## 2023-05-10 ENCOUNTER — Emergency Department (HOSPITAL_COMMUNITY)

## 2023-05-10 DIAGNOSIS — M79621 Pain in right upper arm: Secondary | ICD-10-CM | POA: Insufficient documentation

## 2023-05-10 DIAGNOSIS — R079 Chest pain, unspecified: Secondary | ICD-10-CM | POA: Diagnosis not present

## 2023-05-10 DIAGNOSIS — Z79899 Other long term (current) drug therapy: Secondary | ICD-10-CM | POA: Insufficient documentation

## 2023-05-10 DIAGNOSIS — E119 Type 2 diabetes mellitus without complications: Secondary | ICD-10-CM | POA: Diagnosis not present

## 2023-05-10 DIAGNOSIS — I110 Hypertensive heart disease with heart failure: Secondary | ICD-10-CM | POA: Diagnosis not present

## 2023-05-10 DIAGNOSIS — I509 Heart failure, unspecified: Secondary | ICD-10-CM | POA: Insufficient documentation

## 2023-05-10 LAB — BASIC METABOLIC PANEL
Anion gap: 11 (ref 5–15)
BUN: 18 mg/dL (ref 8–23)
CO2: 23 mmol/L (ref 22–32)
Calcium: 9.9 mg/dL (ref 8.9–10.3)
Chloride: 105 mmol/L (ref 98–111)
Creatinine, Ser: 1.41 mg/dL — ABNORMAL HIGH (ref 0.44–1.00)
GFR, Estimated: 35 mL/min — ABNORMAL LOW (ref >60)
Glucose, Bld: 157 mg/dL — ABNORMAL HIGH (ref 70–99)
Potassium: 3.3 mmol/L — ABNORMAL LOW (ref 3.5–5.1)
Sodium: 139 mmol/L (ref 135–145)

## 2023-05-10 LAB — CBC
HCT: 42.1 % (ref 36.0–46.0)
Hemoglobin: 13.4 g/dL (ref 12.0–15.0)
MCH: 29.8 pg (ref 26.0–34.0)
MCHC: 31.8 g/dL (ref 30.0–36.0)
MCV: 93.8 fL (ref 80.0–100.0)
Platelets: 164 10*3/uL (ref 150–400)
RBC: 4.49 MIL/uL (ref 3.87–5.11)
RDW: 12 % (ref 11.5–15.5)
WBC: 5.7 10*3/uL (ref 4.0–10.5)
nRBC: 0 % (ref 0.0–0.2)

## 2023-05-10 LAB — HEPATIC FUNCTION PANEL
ALT: 12 U/L (ref 0–44)
AST: 21 U/L (ref 15–41)
Albumin: 2.5 g/dL — ABNORMAL LOW (ref 3.5–5.0)
Alkaline Phosphatase: 45 U/L (ref 38–126)
Bilirubin, Direct: 0.2 mg/dL (ref 0.0–0.2)
Indirect Bilirubin: 0.7 mg/dL (ref 0.3–0.9)
Total Bilirubin: 0.9 mg/dL (ref 0.0–1.2)
Total Protein: 6 g/dL — ABNORMAL LOW (ref 6.5–8.1)

## 2023-05-10 LAB — TROPONIN I (HIGH SENSITIVITY)
Troponin I (High Sensitivity): 10 ng/L (ref <18)
Troponin I (High Sensitivity): 12 ng/L (ref <18)

## 2023-05-10 MED ORDER — IOHEXOL 350 MG/ML SOLN
100.0000 mL | Freq: Once | INTRAVENOUS | Status: AC | PRN
  Administered 2023-05-10: 100 mL via INTRAVENOUS

## 2023-05-10 MED ORDER — MORPHINE SULFATE (PF) 2 MG/ML IV SOLN
2.0000 mg | Freq: Once | INTRAVENOUS | Status: AC
  Administered 2023-05-10: 2 mg via INTRAVENOUS
  Filled 2023-05-10: qty 1

## 2023-05-10 NOTE — ED Provider Notes (Addendum)
 [place] EMERGENCY DEPARTMENT AT [facility] Provider Note   CSN: [id number] Arrival date & time: 05/10/23  1728     History  No chief complaint on file.   Amber Bolton is a 89 y.o. female with PMH as listed below who presents with right axillary pain, chest pain.   Patient was admitted from 03/09/2023 to 03/14/2023 with small type B aortic dissection as well as concurrent shingles.  Was managed on IV antihypertensives and transition to oral regimen.  Was discharged and supposed to follow-up with vascular surgery in 6 weeks.  Was seen on 04/29/2023 with her internal medicine physician at [facility] where she noted some lower extremity edema but was otherwise doing well.  Had a normal BNP and baseline CMP at that visit.  According to [facility]: Pt coming from home. Calling ems for right axillary pain radiating to her chest 7/10 that started "a while ago" but yesterday got significantly worse.  Patient does not remember if it was the same pain she has had recently with her dissection. Denies nausea/vomiting, abdominal pain, shortness of breath, cough, f/c, numbness/tingling, arm swelling. No trauma/falls that she remembers.  Pt has had 324 mg of asprin. BP 108/70, last bp 132/60.     Past Medical History:  Diagnosis Date  . Arthritis    IN RIGHT HIP AND FINGERS AND BACK  . Bilateral subdural hematomas (HCC)   . Diabetes (HCC)   . HFrEF (heart failure with reduced ejection fraction) (HCC)   . Hypercalcemia   . Hyperlipemia   . Resistant hypertension 03/09/2022  . Sleep apnea    STATES SHE HAS CPAP - BUT HAS NOT USED IN THE LAST 2 YRS  . TIA (transient ischemic attack)        Home Medications Prior to Admission medications   Medication Sig Start Date End Date Taking? Authorizing Provider  amLODipine (NORVASC) 10 MG tablet Take 10 mg by mouth daily.    [provider]  carvedilol (COREG) 12.5 MG tablet Take 1 tablet (12.5 mg total) by mouth 2 (two) times  daily with a meal. 03/14/23 06/12/23  Lorin Glass, MD  feeding supplement (ENSURE ENLIVE / ENSURE PLUS) LIQD Take 237 mLs by mouth 3 (three) times daily between meals. Patient taking differently: Take 237 mLs by mouth daily as needed (for meal supplement). 11/09/22   Alexander-Savino, [place], MD  hydrALAZINE (APRESOLINE) 50 MG tablet Take 1 tablet (50 mg total) by mouth 3 (three) times daily. 03/14/23   Lorin Glass, MD  levETIRAcetam (KEPPRA) 250 MG tablet Take 1 tablet (250 mg total) by mouth 2 (two) times daily. 06/18/22   Lynnae January, NP  pantoprazole (PROTONIX) 40 MG tablet Take 1 tablet (40 mg total) by mouth daily. 11/09/22 11/09/23  Alexander-Savino, [place], MD  polyethylene glycol (MIRALAX) 17 g packet Take 17 g by mouth daily as needed for mild constipation or moderate constipation. 03/14/23   Lorin Glass, MD  rosuvastatin (CRESTOR) 10 MG tablet Take 1 tablet (10 mg total) by mouth daily. 06/19/22   Lynnae January, NP  traMADol (ULTRAM) 50 MG tablet Take 50 mg by mouth 2 (two) times daily as needed for moderate pain or severe pain.    [provider]  triamcinolone cream (KENALOG) 0.1 % Apply 1 Application topically daily as needed (rash).    [provider]  Vitamin D, Ergocalciferol, (DRISDOL) 1.25 MG (50000 UNIT) CAPS capsule Take 50,000 Units by mouth once a week. Patient  not taking: Reported on 03/10/2023    [provider]      Allergies    Penicillins and Shellfish allergy    Review of Systems   Review of Systems A 10 point review of systems was performed and is negative unless otherwise reported in HPI.  Physical Exam Updated Vital Signs BP 127/67   Pulse 60   Temp 98.4 F (36.9 C) (Oral)   Resp 11   Ht 4\' 6"  (1.372 m)   Wt 59.9 kg   SpO2 97%   BMI 31.83 kg/m  Physical Exam General: Normal appearing elderly female, lying in bed.  HEENT: PERRLA, Sclera anicteric, MMM, trachea midline.  Cardiology: RRR, no murmurs/rubs/gallops. BL  radial and DP pulses equal bilaterally. TTP diffusely to R axilla without any signs of trauma, overlying skin changes, swelling, or masses noted. No deformities. No arm swelling.  Resp: Normal respiratory rate and effort. CTAB, no wheezes, rhonchi, crackles.  Abd: Soft, non-tender, non-distended. No rebound tenderness or guarding.  GU: Deferred. MSK: No peripheral edema or signs of trauma. Extremities without deformity or TTP. No cyanosis or clubbing. Skin: warm, dry.  Neuro: A&Ox4, CNs II-XII grossly intact. MAEs. Sensation grossly intact.  Psych: Normal mood and affect.   ED Results / Procedures / Treatments   Labs (all labs ordered are listed, but only abnormal results are displayed) Labs Reviewed  BASIC METABOLIC PANEL - Abnormal; Notable for the following components:      Result Value   Potassium 3.3 (*)    Glucose, Bld 157 (*)    Creatinine, Ser 1.41 (*)    GFR, Estimated 35 (*)    All other components within normal limits  HEPATIC FUNCTION PANEL - Abnormal; Notable for the following components:   Total Protein 6.0 (*)    Albumin 2.5 (*)    All other components within normal limits  CBC  TROPONIN I (HIGH SENSITIVITY)  TROPONIN I (HIGH SENSITIVITY)    EKG EKG Interpretation Date/Time:  Monday May 10 2023 18:00:05 EDT Ventricular Rate:  56 PR Interval:  188 QRS Duration:  146 QT Interval:  461 QTC Calculation: 445 R Axis:   -73  Text Interpretation: Sinus rhythm Atrial premature complex Left bundle branch block Confirmed by Vivi Barrack [phone]) on 05/10/2023 11:57:42 PM  Radiology CT Angio Chest/Abd/Pel for Dissection W and/or Wo Contrast Result Date: 05/10/2023 CLINICAL DATA:  Acute aortic syndrome suspected. EXAM: CT ANGIOGRAPHY CHEST, ABDOMEN AND PELVIS TECHNIQUE: Noncontrast CT chest was performed. Multidetector CT imaging through the chest, abdomen and pelvis was performed using the standard protocol during bolus administration of intravenous contrast.  Multiplanar reconstructed images and MIPs were obtained and reviewed to evaluate the vascular anatomy. RADIATION DOSE REDUCTION: This exam was performed according to the departmental dose-optimization program which includes automated exposure control, adjustment of the mA and/or kV according to patient size and/or use of iterative reconstruction technique. CONTRAST:  OMNIPAQUE IOHEXOL 350 MG/ML SOLN COMPARISON:  CT angiogram chest abdomen and pelvis 03/12/2023 and 03/10/2023. FINDINGS: CTA CHEST FINDINGS Cardiovascular: Ascending aorta is dilated measuring 4 cm, unchanged. Descending aorta is dilated measuring 4 cm, unchanged. Noncalcified plaque/web seen in the distal descending thoracic aorta is similar to the prior study. No new dissection flap identified. Origin of the great vessels appears within normal limits. There is no central pulmonary embolism. The heart is enlarged. There is no pericardial effusion. Mediastinum/Nodes: Subcentimeter thyroid nodules are unchanged. No enlarged lymph nodes are identified. Visualized esophagus is within normal limits. Lungs/Pleura: Lungs  are clear.  No pleural effusion or pneumothorax. Musculoskeletal: Degenerative changes affect the spine. Review of the MIP images confirms the above findings. CTA ABDOMEN AND PELVIS FINDINGS VASCULAR Aorta: Normal caliber aorta without aneurysm, dissection, vasculitis or significant stenosis. Calcified atherosclerotic disease is again noted. Celiac: Patent without evidence of aneurysm, dissection, vasculitis or significant stenosis. SMA: Patent without evidence of aneurysm, dissection, vasculitis or significant stenosis. Renals: Both renal arteries are patent without evidence of aneurysm, dissection, vasculitis, fibromuscular dysplasia or significant stenosis. IMA: Patent without evidence of aneurysm, dissection, vasculitis or significant stenosis. Inflow: Patent without evidence of aneurysm, dissection, vasculitis or significant  stenosis. Veins: No obvious venous abnormality within the limitations of this arterial phase study. Review of the MIP images confirms the above findings. NON-VASCULAR Hepatobiliary: No focal liver abnormality is seen. No gallstones, gallbladder wall thickening, or biliary dilatation. Pancreas: Unremarkable. No pancreatic ductal dilatation or surrounding inflammatory changes. Spleen: Normal in size without focal abnormality. Adrenals/Urinary Tract: Right adrenal nodule is compatible with adenoma, unchanged. The left adrenal gland, kidneys and bladder are within normal limits. Stomach/Bowel: Stomach is within normal limits. No evidence of bowel wall thickening, distention, or inflammatory changes. The appendix is not seen. There is diffuse colonic diverticulosis. Lymphatic: No enlarged lymph nodes are identified. Reproductive: Status post hysterectomy. No adnexal masses. Other: There is a small fat containing umbilical hernia. There is no ascites. Musculoskeletal: Degenerative changes affect the spine and left hip. Right hip arthroplasty is present. Review of the MIP images confirms the above findings. IMPRESSION: 1. Stable dilatation of the ascending and descending thoracic aorta measuring 4 cm. No new dissection flap identified. 2. Stable web-like filling defect in the distal descending thoracic aorta. 3. No acute localizing process in the chest, abdomen or pelvis. 4. Colonic diverticulosis. 5. Stable right adrenal adenoma. 6. Aortic atherosclerosis. Aortic Atherosclerosis (ICD10-I70.0). Electronically Signed   By: Darliss Cheney M.D.   On: 05/10/2023 23:55   DG Chest 2 View Result Date: 05/10/2023 CLINICAL DATA:  Chest pain EXAM: CHEST - 2 VIEW COMPARISON:  03/09/2023, CT chest 03/12/2023 FINDINGS: Low lung volumes. Stable enlarged cardiomediastinal silhouette. No acute airspace disease, pleural effusion or pneumothorax IMPRESSION: Low lung volumes. Stable cardiomegaly. Electronically Signed   By: Jasmine Pang  M.D.   On: 05/10/2023 20:54    Procedures Procedures    Medications Ordered in ED Medications  morphine (PF) 2 MG/ML injection 2 mg (2 mg Intravenous Given 05/10/23 2114)  iohexol (OMNIPAQUE) 350 MG/ML injection 100 mL (100 mLs Intravenous Contrast Given 05/10/23 2139)    ED Course/ Medical Decision Making/ A&P                          Medical Decision Making Amount and/or Complexity of Data Reviewed Labs: ordered. Decision-making details documented in ED Course. Radiology: ordered.  Risk Prescription drug management.    This patient presents to the ED for concern of R axillary/chest pain, this involves an extensive number of treatment options, and is a complaint that carries with it a high risk of complications and morbidity.  I considered the following differential and admission for this acute, potentially life threatening condition.   MDM:    DDX for chest pain includes but is not limited to:  Greatest concern for worsening of known type B dissection, will get CTA dissection protocol. Also consider atypical ACS. EKG shows LBBB without signs of acute ischemia, troponin reassuringly negative x2. No hypoxia or SOB, no tachycardia, but recent admission  is risk factor for PE and will assess also w/ CT. She has no skin changes to indicate cellulitis but is tender in her axilla, consider possible rib fractures, she denies trauma/falls but also does not seem to be reliable historian. No cough/tachypnea/abnormal lung sounds to indicate PNA. Also considered a UE DVT though patient has no swelling to RUE and no pain to palpation of the humerus or arm, lower c/f DVT. She has no hypertension at this time to indicate hypertensive emergency.    Clinical Course as of 05/11/23 0003  Mon May 10, 2023  2034 Troponin I (High Sensitivity): 10 neg [HN]  2034 Basic metabolic panel(!) C/w priors [HN]  2034 CBC wnl [HN]  2314 Troponin I (High Sensitivity): 12 Flat/neg x2 [HN]  2340 Calling   radiology for read on CTA [HN]  2358 CT Angio Chest/Abd/Pel for Dissection W and/or Wo Contrast 1. Stable dilatation of the ascending and descending thoracic aorta measuring 4 cm. No new dissection flap identified. 2. Stable web-like filling defect in the distal descending thoracic aorta. 3. No acute localizing process in the chest, abdomen or pelvis. 4. Colonic diverticulosis. 5. Stable right adrenal adenoma. 6. Aortic atherosclerosis.   [HN]  Tue May 11, 2023  0000 Patient's workup reassuringly negative. Suspect musculoskeletal pain, especially given tenderness to palpation. No fever or white count, no signs of cellulitis there. Recommend tylenol at home and follow up with primary care physician within 1 week. DC w/ discharge instructions/return precautions. All questions answered to patient's satisfaction.   [HN]  0002 Called patient's daughter Pernell Dupre  [HN]    Clinical Course User Index [HN] Loetta Rough, MD    Labs: I Ordered, and personally interpreted labs.  The pertinent results include:  those listed above  Imaging Studies ordered: CXR ordered from triage. I ordered imaging studies including CTA dissection protocol I independently visualized and interpreted imaging. I agree with the radiologist interpretation  Additional history obtained from chart review, EMS.  External records from outside source obtained and reviewed including internal medicine  Cardiac Monitoring: .The patient was maintained on a cardiac monitor.  I personally viewed and interpreted the cardiac monitored which showed an underlying rhythm of: sinus bradycardia  Reevaluation: After the interventions noted above, I reevaluated the patient and found that they have :improved  Social Determinants of Health: .Lives at home  Disposition:  DC   Co morbidities that complicate the patient evaluation . Past Medical History:  Diagnosis Date  . Arthritis    IN RIGHT HIP AND FINGERS  AND BACK  . Bilateral subdural hematomas (HCC)   . Diabetes (HCC)   . HFrEF (heart failure with reduced ejection fraction) (HCC)   . Hypercalcemia   . Hyperlipemia   . Resistant hypertension 03/09/2022  . Sleep apnea    STATES SHE HAS CPAP - BUT HAS NOT USED IN THE LAST 2 YRS  . TIA (transient ischemic attack)      Medicines Meds ordered this encounter  Medications  . morphine (PF) 2 MG/ML injection 2 mg  . iohexol (OMNIPAQUE) 350 MG/ML injection 100 mL    I have reviewed the patients home medicines and have made adjustments as needed  Problem List / ED Course: Problem List Items Addressed This Visit   None Visit Diagnoses       Axillary pain, right    -  Primary     Chest pain, unspecified type  Loetta Rough, MD 05/11/23 0002

## 2023-05-10 NOTE — ED Triage Notes (Signed)
 According to [facility]: Pt coming from home. Calling ems for chest pain 7/10 with hx. Pt has had 324 mg of asprin. BP 108/70, last bp 132/60. Hr in 60s, 12 lead unremarkable, 98% room air, CBG 100.

## 2023-05-11 NOTE — Discharge Instructions (Signed)
 Thank you for coming to [facility] Emergency Department. You were seen for pain in the right axilla (armpit) and chest. We did an exam, labs, and CT imaging of your chest/abdomen, and these showed no acute findings. You can take tylenol 650 mg every 6-8 hours at home. Please follow up with your primary care provider within 1 week.   Do not hesitate to return to the ED or call 911 if you experience: -Worsening symptoms -Shortness of breath -Lightheadedness, passing out -Fevers/chills -Anything else that concerns you

## 2023-05-20 ENCOUNTER — Inpatient Hospital Stay: Admission: RE | Admit: 2023-05-20 | Payer: 59 | Source: Ambulatory Visit

## 2023-05-24 ENCOUNTER — Ambulatory Visit (INDEPENDENT_AMBULATORY_CARE_PROVIDER_SITE_OTHER): Payer: 59 | Admitting: Surgery

## 2023-05-24 ENCOUNTER — Encounter: Payer: Self-pay | Admitting: Surgery

## 2023-05-24 VITALS — BP 123/80 | HR 66 | Temp 98.0°F

## 2023-05-24 DIAGNOSIS — I71012 Dissection of descending thoracic aorta: Secondary | ICD-10-CM | POA: Diagnosis not present

## 2023-05-24 NOTE — Progress Notes (Signed)
 [facility] [place]  Patient name: Amber Bolton MRN: [id number] DOB: Oct 17, 1929 Sex: female     REASON FOR visit:    Follow-up  HISTORY OF PRESENT ILLNESS:   Amber Bolton is a 89 y.o. female, who presented to the emergency department on 03/10/2023 with complaints of right shoulder pain that goes all the way down her back that has been present for 3 to 4 days.  She was hypertensive on arrival.  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  She denied any abdominal pain or lower extremity discomfort.  She was started on blood pressure medications and transferred to the ICU.  She was found to have shingles which was likely the source of her pain.  She is doing well now with the exception of some right axillary pain from her shingles.  PAST MEDICAL HISTORY    Past Medical History:  Diagnosis Date   Arthritis    IN RIGHT HIP AND FINGERS AND BACK   Bilateral subdural hematomas (HCC)    Diabetes (HCC)    HFrEF (heart failure with reduced ejection fraction) (HCC)    Hypercalcemia    Hyperlipemia    Resistant hypertension 03/09/2022   Sleep apnea    STATES SHE HAS CPAP - BUT HAS NOT USED IN THE LAST 2 YRS   TIA (transient ischemic attack)      FAMILY HISTORY   Family History  Problem Relation Age of Onset   Breast cancer Mother     SOCIAL HISTORY:   Social History   Socioeconomic History   Marital status: Married    Spouse name: Not on file   Number of children: Not on file   Years of education: Not on file   Highest education level: Not on file  Occupational History   Not on file  Tobacco Use   Smoking status: Never   Smokeless tobacco: Never  Vaping Use   Vaping status: Never Used  Substance and Sexual Activity   Alcohol use: No   Drug use: No   Sexual activity: Not Currently  Other Topics Concern   Not on file  Social History  Narrative   Not on file   Social Drivers of Health   Financial Resource Strain: Not on file  Food Insecurity: No Food Insecurity (03/10/2023)   Hunger Vital Sign    Worried About Running Out of Food in the Last Year: Never true    Ran Out of Food in the Last Year: Never true  Transportation Needs: No Transportation Needs (03/10/2023)   PRAPARE - Administrator, Civil Service (Medical): No    Lack of Transportation (Non-Medical): No  Physical Activity: Not on file  Stress: Not on file  Social Connections: Moderately Isolated (03/10/2023)   Social Connection and Isolation Panel [NHANES]    Frequency of Communication with Friends and Family: More than three times a week    Frequency of Social Gatherings with Friends and Family: Three times a week    Attends Religious Services: 1 to 4 times per year    Active Member of Clubs or Organizations: No    Attends Banker Meetings: Never    Marital Status: Widowed  Intimate Partner Violence: Not At Risk (03/10/2023)   Humiliation, Afraid, Rape, and Kick questionnaire    Fear of Current or Ex-Partner: No    Emotionally Abused: No    Physically Abused:  No    Sexually Abused: No    ALLERGIES:    Allergies  Allergen Reactions   Penicillins Rash   Shellfish Allergy Itching    Mouth itches    CURRENT MEDICATIONS:    Current Outpatient Medications  Medication Sig Dispense Refill   amLODipine (NORVASC) 10 MG tablet Take 10 mg by mouth daily.     carvedilol (COREG) 12.5 MG tablet Take 1 tablet (12.5 mg total) by mouth 2 (two) times daily with a meal. 180 tablet 2   feeding supplement (ENSURE ENLIVE / ENSURE PLUS) LIQD Take 237 mLs by mouth 3 (three) times daily between meals. (Patient taking differently: Take 237 mLs by mouth daily as needed (for meal supplement).) 237 mL 12   hydrALAZINE (APRESOLINE) 50 MG tablet Take 1 tablet (50 mg total) by mouth 3 (three) times daily. 270 tablet 2   levETIRAcetam (KEPPRA) 250 MG  tablet Take 1 tablet (250 mg total) by mouth 2 (two) times daily. 60 tablet 0   pantoprazole (PROTONIX) 40 MG tablet Take 1 tablet (40 mg total) by mouth daily. 30 tablet 1   polyethylene glycol (MIRALAX) 17 g packet Take 17 g by mouth daily as needed for mild constipation or moderate constipation. 14 each 0   rosuvastatin (CRESTOR) 10 MG tablet Take 1 tablet (10 mg total) by mouth daily. 30 tablet 0   traMADol (ULTRAM) 50 MG tablet Take 50 mg by mouth 2 (two) times daily as needed for moderate pain or severe pain.     triamcinolone cream (KENALOG) 0.1 % Apply 1 Application topically daily as needed (rash).     Vitamin D, Ergocalciferol, (DRISDOL) 1.25 MG (50000 UNIT) CAPS capsule Take 50,000 Units by mouth once a week.     No current facility-administered medications for this visit.    REVIEW OF SYSTEMS:   [X]  denotes positive finding, [ ]  denotes negative finding Cardiac  Comments:  Chest pain or chest pressure:    Shortness of breath upon exertion:    Short of breath when lying flat:    Irregular heart rhythm:        Vascular    Pain in calf, thigh, or hip brought on by ambulation:    Pain in feet at night that wakes you up from your sleep:     Blood clot in your veins:    Leg swelling:         Pulmonary    Oxygen at home:    Productive cough:     Wheezing:         Neurologic    Sudden weakness in arms or legs:     Sudden numbness in arms or legs:     Sudden onset of difficulty speaking or slurred speech:    Temporary loss of vision in one eye:     Problems with dizziness:         Gastrointestinal    Blood in stool:      Vomited blood:         Genitourinary    Burning when urinating:     Blood in urine:        Psychiatric    Major depression:         Hematologic    Bleeding problems:    Problems with blood clotting too easily:        Skin    Rashes or ulcers:        Constitutional    Fever or chills:  PHYSICAL EXAM:   Vitals:   05/24/23 1052  BP:  123/80  Pulse: 66  Temp: 98 F (36.7 C)  SpO2: 91%    GENERAL: The patient is a well-nourished female, in no acute distress. The vital signs are documented above. CARDIAC: There is a regular rate and rhythm.  VASCULAR: Palpable pedal pulses PULMONARY: Nonlabored respirations ABDOMEN: Soft and non-tender MUSCULOSKELETAL: There are no major deformities or cyanosis. NEUROLOGIC: No focal weakness or paresthesias are detected. SKIN: There are no ulcers or rashes noted. PSYCHIATRIC: The patient has a normal affect.  STUDIES:   I have reviewed the following CTA: 1. Stable dilatation of the ascending and descending thoracic aorta measuring 4 cm. No new dissection flap identified. 2. Stable web-like filling defect in the distal descending thoracic aorta. 3. No acute localizing process in the chest, abdomen or pelvis. 4. Colonic diverticulosis. 5. Stable right adrenal adenoma. 6. Aortic atherosclerosis.   Aortic Atherosclerosis (ICD10-I70.0).  ASSESSMENT and PLAN   Aortic dissection: I suspect that her initial presentation was a chronic dissection which may be dates back to an episode of chest pain 2 years ago.  Regardless, she is now pain-free.  She has no signs of malperfusion and the maximum size of her aorta is 4 cm.  In addition I do not think that she is a very good operative candidate because of her age.  I will try to do everything possible to avoid intervention.  She will return in 1 year with a repeat CT scan.   Charlena Cross, MD, FACS [facility] [phone] Pager [phone]

## 2023-06-29 ENCOUNTER — Telehealth: Payer: Self-pay | Admitting: Neurology

## 2023-06-29 NOTE — Telephone Encounter (Signed)
 Daughter reports pt is in hospice and has asked appointment be cx

## 2023-07-06 IMAGING — CT CT RENAL STONE PROTOCOL
2 of 4 series · 15 of 46 positions shown, 17 images · non-contrast
Comparison: None.

CLINICAL DATA: Flank pain, kidney stone suspected

Dysuria and urinary frequency.
EXAM:
CT ABDOMEN AND PELVIS WITHOUT CONTRAST
TECHNIQUE: Multidetector CT imaging of the abdomen and pelvis was performed
following the standard protocol without IV contrast.
RADIATION DOSE REDUCTION: This exam was performed according to the
departmental dose-optimization program which includes automated
exposure control, adjustment of the mA and/or kV according to
patient size and/or use of iterative reconstruction technique.

[Series 2: axial st · axial · 0.77mm/px · z∈[-341,+34]mm · 12 of 85 slices shown, 14 images]
[im 5/85  soft-tissue]
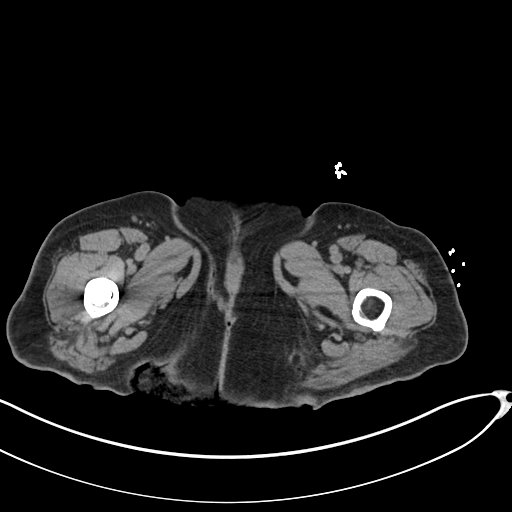
[im 5/85  bone]
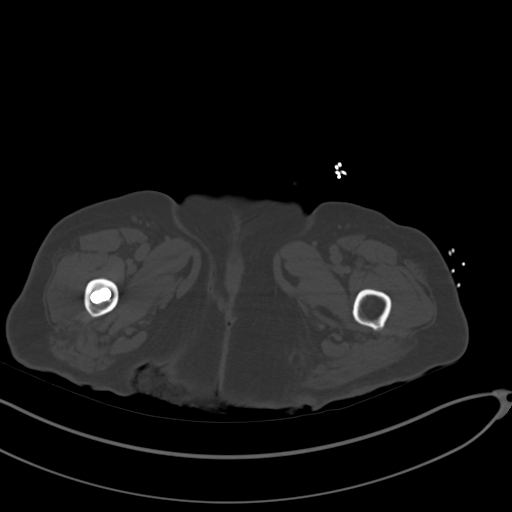
[im 15/85  soft-tissue]
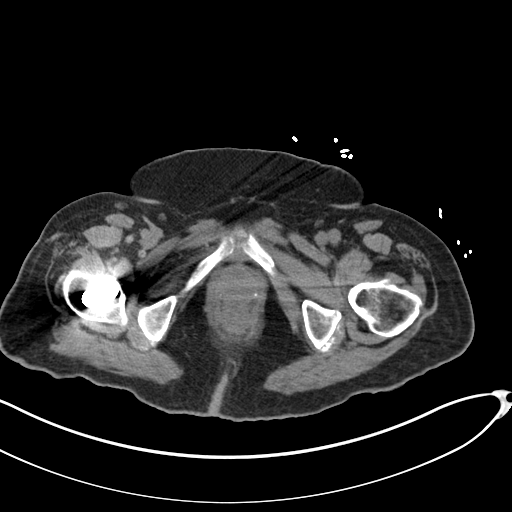
[im 20/85  soft-tissue]
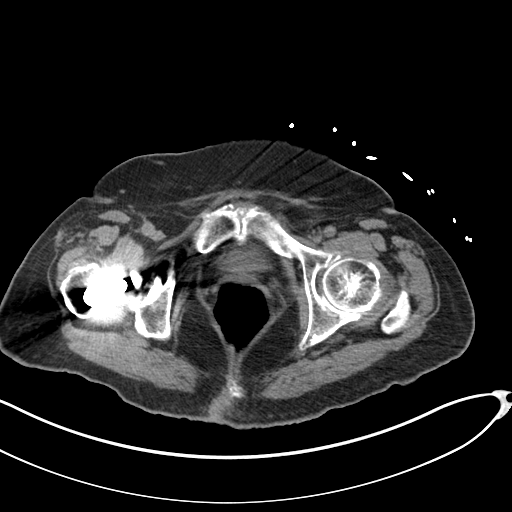
[im 25/85  soft-tissue]
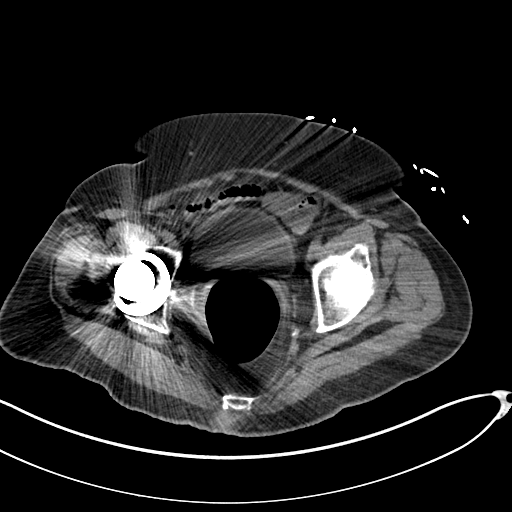
[im 35/85  soft-tissue]
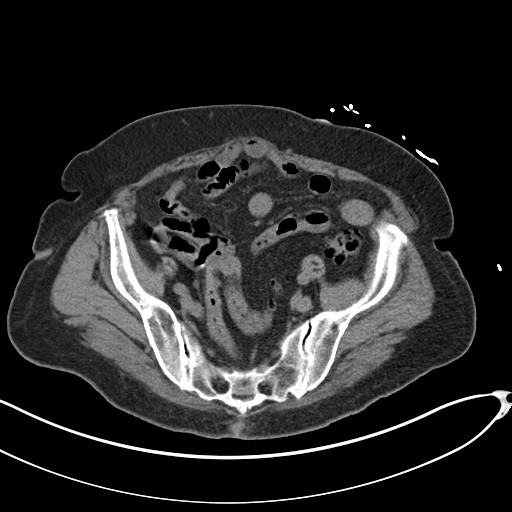
[im 40/85  soft-tissue]
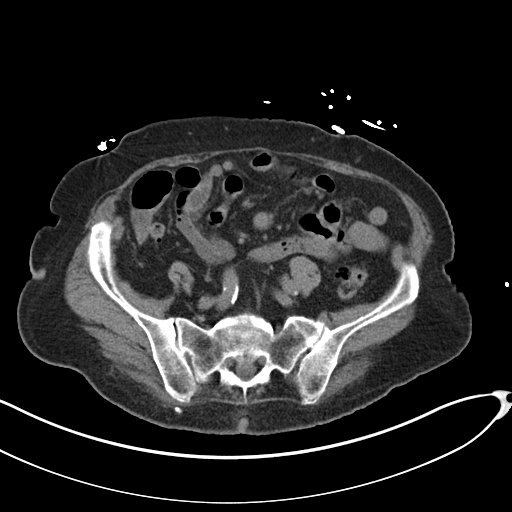
[im 45/85  soft-tissue]
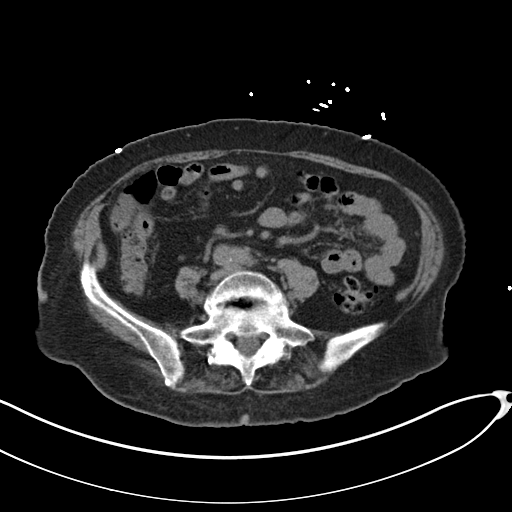
[im 55/85  soft-tissue]
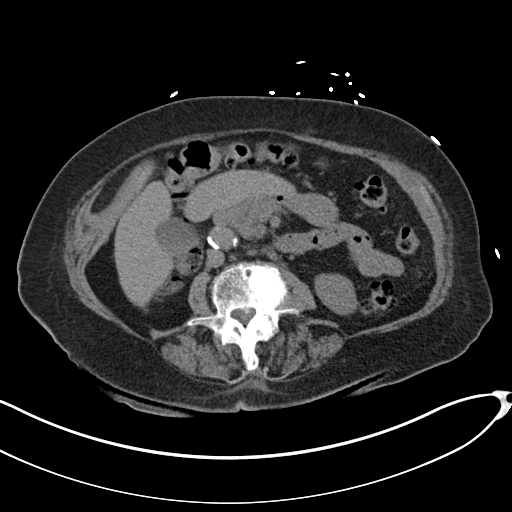
[im 60/85  soft-tissue]
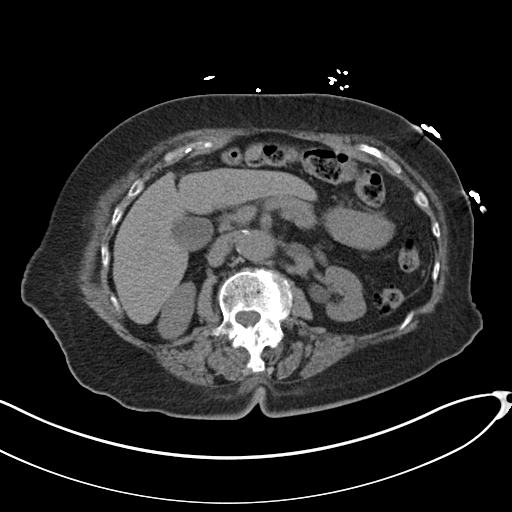
[im 60/85  bone]
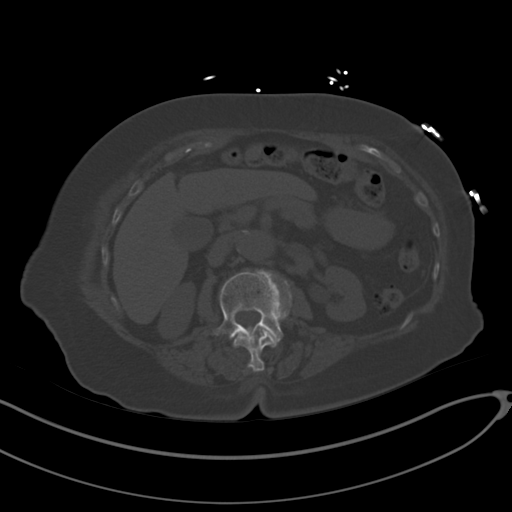
[im 65/85  soft-tissue]
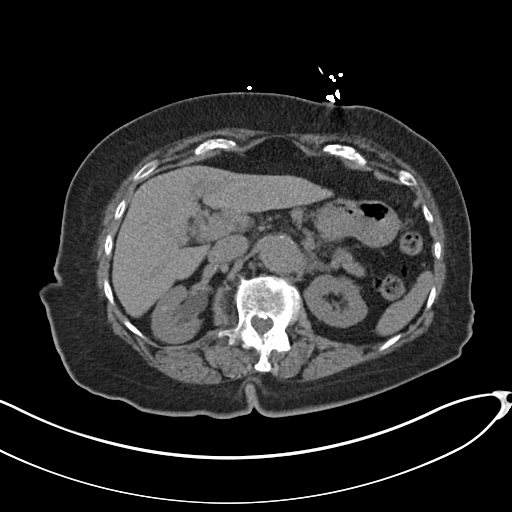
[im 75/85  soft-tissue]
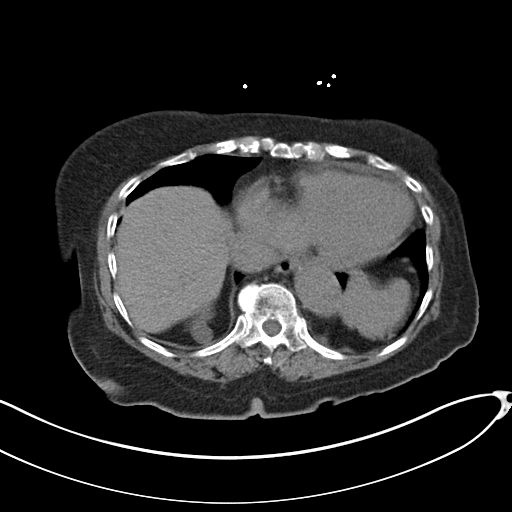
[im 80/85  soft-tissue]
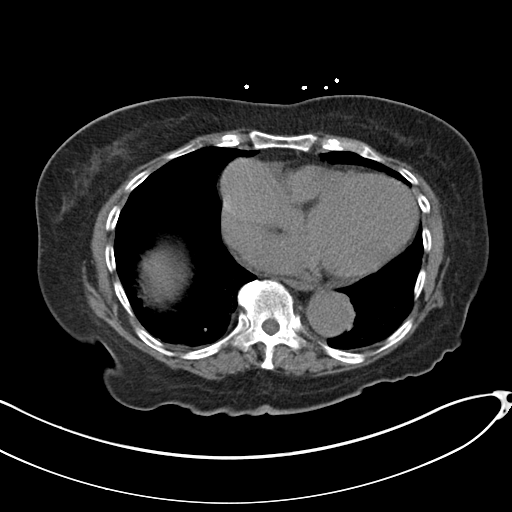

[Series 4: coronal · coronal · 0.81mm/px · 3 of 125 slices shown]
[im 42/125  soft-tissue]
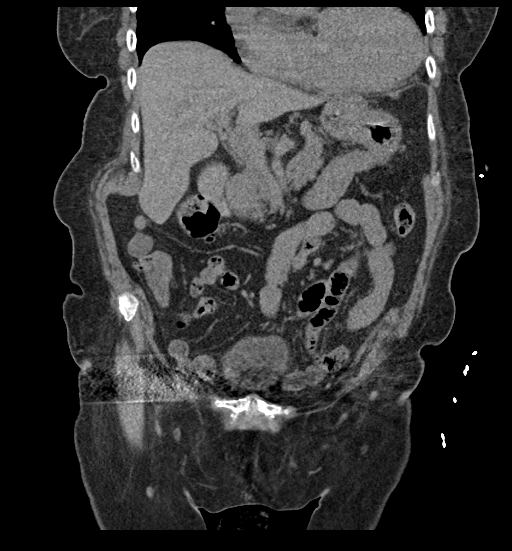
[im 56/125  soft-tissue]
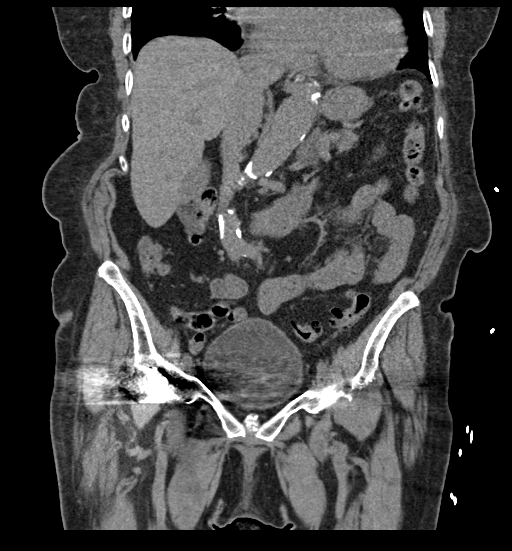
[im 69/125  soft-tissue]
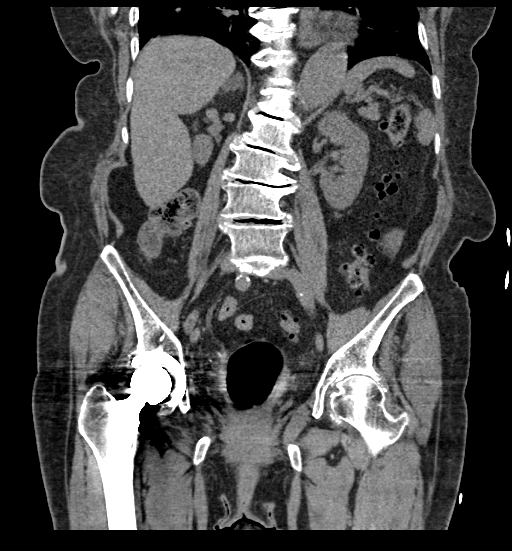

[15 of 46 positions shown; findings below may reference images not displayed]

FINDINGS: Lower chest: Mild cardiomegaly. No basilar airspace disease or
pleural effusion

Hepatobiliary: Punctate granuloma in the anterior liver. No focal
liver abnormality on this unenhanced exam. Physiologically distended
gallbladder. No calcified gallstone or pericholecystic fat
stranding. Normal common bile duct for age.

Pancreas: No ductal dilatation or inflammation.

Spleen: Normal in size without focal abnormality.

Adrenals/Urinary Tract: 14 mm low-density right adrenal nodule with
Hounsfield unit of 1.6. Slight left adrenal thickening without
dominant nodule. There is mild right hydroureteronephrosis. The no
evidence of ureteral stone or source of obstruction, although the
distal right ureter is obscured by streak artifact from right hip
arthroplasty. No intrarenal calculi. 17 mm cyst arising from the
upper right kidney, no further follow-up is needed. Mild prominence
of the left renal pelvis without frank hydronephrosis. No left renal
calculi. No focal left renal abnormality. The left ureter is
nondilated. Partially distended urinary bladder. Portions of the
bladder obscured by streak artifact from left hip arthroplasty. No
obvious bladder stone or wall thickening.

Stomach/Bowel: Pan colonic diverticulosis, prominent in the distal
descending and sigmoid colon. No diverticulitis or acute colonic
inflammation. The appendix is not confidently seen, no appendicitis.
There is no small bowel obstruction or inflammatory change. Small
hiatal hernia, stomach is otherwise decompressed

Vascular/Lymphatic: Aortic atherosclerosis and tortuosity. No aortic
aneurysm. No portal venous or mesenteric gas. No bulky
abdominopelvic adenopathy.

Reproductive: The uterus is not seen, presumably surgically absent.
There is no adnexal mass.

Other: No ascites or free air. Tiny fat containing umbilical hernia.

Musculoskeletal: Right hip arthroplasty. L5 is sacralized. Grade 1
anterolisthesis of L4 on L5 with prominent facet hypertrophy and
degenerative disc disease. Scoliosis with diffuse degenerative
change throughout the remainder of the spine. No acute osseous
findings.
IMPRESSION: 1. Mild right hydroureteronephrosis without evidence of ureteral
stone or source of obstruction, although the distal right ureter is
obscured by streak artifact from right hip arthroplasty. Findings
may be secondary to urinary tract infection. Recommend correlation
with urinalysis.
2. Pan colonic diverticulosis without acute inflammation.
3. Small hiatal hernia.
4. Right adrenal adenoma.  No follow-up imaging is recommended.
JACR [DATE]):0339-44, JCAT 4766 [REDACTED]; 40(2):194-200, Urol
J [address]; 3(2):71-4.

Aortic Atherosclerosis ([id number]).

## 2023-09-23 ENCOUNTER — Ambulatory Visit: Payer: Medicare Other | Admitting: Neurology

## 2023-12-01 DEATH — deceased
